# Patient Record
Sex: Female | Born: 1945 | Race: White | Hispanic: No | Smoking: Never smoker
Health system: Southern US, Community
[De-identification: ages and names within clinical notes are randomized; demographics above are authoritative.]

## PROBLEM LIST (undated history)

## (undated) DIAGNOSIS — J45909 Unspecified asthma, uncomplicated: Secondary | ICD-10-CM

## (undated) DIAGNOSIS — K5790 Diverticulosis of intestine, part unspecified, without perforation or abscess without bleeding: Secondary | ICD-10-CM

## (undated) DIAGNOSIS — M1990 Unspecified osteoarthritis, unspecified site: Secondary | ICD-10-CM

## (undated) DIAGNOSIS — Z91038 Other insect allergy status: Secondary | ICD-10-CM

## (undated) DIAGNOSIS — Z9289 Personal history of other medical treatment: Secondary | ICD-10-CM

## (undated) DIAGNOSIS — H269 Unspecified cataract: Secondary | ICD-10-CM

## (undated) DIAGNOSIS — E119 Type 2 diabetes mellitus without complications: Secondary | ICD-10-CM

## (undated) DIAGNOSIS — Z860100 Personal history of colon polyps, unspecified: Secondary | ICD-10-CM

## (undated) DIAGNOSIS — T8859XA Other complications of anesthesia, initial encounter: Secondary | ICD-10-CM

## (undated) DIAGNOSIS — Z91030 Bee allergy status: Secondary | ICD-10-CM

## (undated) DIAGNOSIS — K219 Gastro-esophageal reflux disease without esophagitis: Secondary | ICD-10-CM

## (undated) DIAGNOSIS — E785 Hyperlipidemia, unspecified: Secondary | ICD-10-CM

## (undated) DIAGNOSIS — E669 Obesity, unspecified: Secondary | ICD-10-CM

## (undated) DIAGNOSIS — I00 Rheumatic fever without heart involvement: Secondary | ICD-10-CM

## (undated) DIAGNOSIS — I447 Left bundle-branch block, unspecified: Secondary | ICD-10-CM

## (undated) DIAGNOSIS — T7840XA Allergy, unspecified, initial encounter: Secondary | ICD-10-CM

## (undated) DIAGNOSIS — Z87448 Personal history of other diseases of urinary system: Secondary | ICD-10-CM

## (undated) DIAGNOSIS — K5792 Diverticulitis of intestine, part unspecified, without perforation or abscess without bleeding: Secondary | ICD-10-CM

## (undated) DIAGNOSIS — I10 Essential (primary) hypertension: Secondary | ICD-10-CM

## (undated) DIAGNOSIS — T4145XA Adverse effect of unspecified anesthetic, initial encounter: Secondary | ICD-10-CM

## (undated) HISTORY — DX: Type 2 diabetes mellitus without complications: E11.9

## (undated) HISTORY — DX: Obesity, unspecified: E66.9

## (undated) HISTORY — DX: Personal history of other medical treatment: Z92.89

## (undated) HISTORY — DX: Allergy, unspecified, initial encounter: T78.40XA

## (undated) HISTORY — DX: Diverticulosis of intestine, part unspecified, without perforation or abscess without bleeding: K57.90

## (undated) HISTORY — DX: Unspecified cataract: H26.9

## (undated) HISTORY — DX: Rheumatic fever without heart involvement: I00

## (undated) HISTORY — DX: Bee allergy status: Z91.030

## (undated) HISTORY — DX: Personal history of colon polyps, unspecified: Z86.0100

## (undated) HISTORY — PX: SINOSCOPY: SHX187

## (undated) HISTORY — DX: Other insect allergy status: Z91.038

## (undated) HISTORY — PX: COLONOSCOPY: SHX174

## (undated) HISTORY — DX: Left bundle-branch block, unspecified: I44.7

## (undated) HISTORY — PX: TONSILLECTOMY: SUR1361

## (undated) HISTORY — DX: Hyperlipidemia, unspecified: E78.5

## (undated) HISTORY — DX: Personal history of other diseases of urinary system: Z87.448

## (undated) HISTORY — PX: JOINT REPLACEMENT: SHX530

## (undated) HISTORY — DX: Personal history of colonic polyps: Z86.010

---

## 1978-03-17 HISTORY — PX: ABDOMINAL HYSTERECTOMY: SHX81

## 1978-03-17 HISTORY — PX: APPENDECTOMY: SHX54

## 1987-03-18 HISTORY — PX: CHOLECYSTECTOMY: SHX55

## 2000-06-23 ENCOUNTER — Encounter: Payer: Self-pay | Admitting: Family Medicine

## 2000-06-23 ENCOUNTER — Encounter: Admission: RE | Admit: 2000-06-23 | Discharge: 2000-06-23 | Payer: Self-pay | Admitting: Family Medicine

## 2001-06-23 ENCOUNTER — Encounter: Admission: RE | Admit: 2001-06-23 | Discharge: 2001-06-23 | Payer: Self-pay | Admitting: Internal Medicine

## 2001-06-23 ENCOUNTER — Encounter: Payer: Self-pay | Admitting: Obstetrics and Gynecology

## 2002-03-17 DIAGNOSIS — Z87448 Personal history of other diseases of urinary system: Secondary | ICD-10-CM

## 2002-03-17 HISTORY — DX: Personal history of other diseases of urinary system: Z87.448

## 2002-06-28 ENCOUNTER — Encounter: Admission: RE | Admit: 2002-06-28 | Discharge: 2002-06-28 | Payer: Self-pay | Admitting: Family Medicine

## 2002-06-28 ENCOUNTER — Encounter: Payer: Self-pay | Admitting: Family Medicine

## 2003-07-03 ENCOUNTER — Ambulatory Visit (HOSPITAL_COMMUNITY): Admission: RE | Admit: 2003-07-03 | Discharge: 2003-07-03 | Payer: Self-pay

## 2004-10-10 ENCOUNTER — Ambulatory Visit (HOSPITAL_COMMUNITY): Admission: RE | Admit: 2004-10-10 | Discharge: 2004-10-10 | Payer: Self-pay | Admitting: Family Medicine

## 2005-10-27 ENCOUNTER — Ambulatory Visit (HOSPITAL_COMMUNITY): Admission: RE | Admit: 2005-10-27 | Discharge: 2005-10-27 | Payer: Self-pay | Admitting: Family Medicine

## 2006-11-02 ENCOUNTER — Encounter: Admission: RE | Admit: 2006-11-02 | Discharge: 2006-11-02 | Payer: Self-pay | Admitting: Family Medicine

## 2007-03-18 HISTORY — PX: KNEE SURGERY: SHX244

## 2007-11-30 ENCOUNTER — Ambulatory Visit (HOSPITAL_BASED_OUTPATIENT_CLINIC_OR_DEPARTMENT_OTHER): Admission: RE | Admit: 2007-11-30 | Discharge: 2007-11-30 | Payer: Self-pay | Admitting: Family Medicine

## 2007-12-07 ENCOUNTER — Encounter: Admission: RE | Admit: 2007-12-07 | Discharge: 2007-12-07 | Payer: Self-pay | Admitting: Family Medicine

## 2008-08-07 HISTORY — PX: COLONOSCOPY: SHX174

## 2009-03-17 HISTORY — PX: COLON SURGERY: SHX602

## 2009-11-07 ENCOUNTER — Inpatient Hospital Stay (HOSPITAL_COMMUNITY): Admission: RE | Admit: 2009-11-07 | Discharge: 2009-11-12 | Payer: Self-pay | Admitting: General Surgery

## 2009-11-07 ENCOUNTER — Encounter (INDEPENDENT_AMBULATORY_CARE_PROVIDER_SITE_OTHER): Payer: Self-pay | Admitting: General Surgery

## 2010-05-31 LAB — BASIC METABOLIC PANEL
BUN: 3 mg/dL — ABNORMAL LOW (ref 6–23)
BUN: 7 mg/dL (ref 6–23)
BUN: 8 mg/dL (ref 6–23)
CO2: 27 mEq/L (ref 19–32)
CO2: 27 mEq/L (ref 19–32)
CO2: 29 mEq/L (ref 19–32)
Calcium: 7.9 mg/dL — ABNORMAL LOW (ref 8.4–10.5)
Calcium: 7.9 mg/dL — ABNORMAL LOW (ref 8.4–10.5)
Calcium: 7.9 mg/dL — ABNORMAL LOW (ref 8.4–10.5)
Chloride: 102 mEq/L (ref 96–112)
Chloride: 104 mEq/L (ref 96–112)
Creatinine, Ser: 0.68 mg/dL (ref 0.4–1.2)
Creatinine, Ser: 0.7 mg/dL (ref 0.4–1.2)
Creatinine, Ser: 0.76 mg/dL (ref 0.4–1.2)
GFR calc Af Amer: >60 mL/min (ref >60)
GFR calc Af Amer: >60 mL/min (ref >60)
GFR calc Af Amer: >60 mL/min (ref >60)
GFR calc non Af Amer: >60 mL/min (ref >60)
GFR calc non Af Amer: >60 mL/min (ref >60)
GFR calc non Af Amer: >60 mL/min (ref >60)
Glucose, Bld: 116 mg/dL — ABNORMAL HIGH (ref 70–99)
Glucose, Bld: 152 mg/dL — ABNORMAL HIGH (ref 70–99)
Potassium: 3 mEq/L — ABNORMAL LOW (ref 3.5–5.1)
Potassium: 3.4 mEq/L — ABNORMAL LOW (ref 3.5–5.1)
Sodium: 138 mEq/L (ref 135–145)
Sodium: 139 mEq/L (ref 135–145)
Sodium: 140 mEq/L (ref 135–145)

## 2010-05-31 LAB — COMPREHENSIVE METABOLIC PANEL
ALT: 22 U/L (ref 0–35)
CO2: 26 mEq/L (ref 19–32)
Calcium: 9.2 mg/dL (ref 8.4–10.5)
Creatinine, Ser: 0.74 mg/dL (ref 0.4–1.2)
GFR calc non Af Amer: >60 mL/min (ref >60)
Glucose, Bld: 150 mg/dL — ABNORMAL HIGH (ref 70–99)
Sodium: 139 mEq/L (ref 135–145)

## 2010-05-31 LAB — CBC
HCT: 23.5 % — ABNORMAL LOW (ref 36.0–46.0)
HCT: 44.3 % (ref 36.0–46.0)
Hemoglobin: 15.4 g/dL — ABNORMAL HIGH (ref 12.0–15.0)
Hemoglobin: 8.8 g/dL — ABNORMAL LOW (ref 12.0–15.0)
MCH: 31 pg (ref 26.0–34.0)
MCH: 31 pg (ref 26.0–34.0)
MCH: 31.5 pg (ref 26.0–34.0)
MCH: 31.5 pg (ref 26.0–34.0)
MCHC: 34.7 g/dL (ref 30.0–36.0)
MCHC: 34.8 g/dL (ref 30.0–36.0)
MCHC: 35.3 g/dL (ref 30.0–36.0)
MCV: 89.2 fL (ref 78.0–100.0)
MCV: 89.2 fL (ref 78.0–100.0)
MCV: 89.6 fL (ref 78.0–100.0)
Platelets: 181 10*3/uL (ref 150–400)
Platelets: 183 10*3/uL (ref 150–400)
Platelets: 198 10*3/uL (ref 150–400)
Platelets: 244 10*3/uL (ref 150–400)
RBC: 2.57 MIL/uL — ABNORMAL LOW (ref 3.87–5.11)
RBC: 2.63 MIL/uL — ABNORMAL LOW (ref 3.87–5.11)
RBC: 3.41 MIL/uL — ABNORMAL LOW (ref 3.87–5.11)
RDW: 13 % (ref 11.5–15.5)
RDW: 13.1 % (ref 11.5–15.5)
RDW: 13.1 % (ref 11.5–15.5)
RDW: 13.2 % (ref 11.5–15.5)
WBC: 10.8 10*3/uL — ABNORMAL HIGH (ref 4.0–10.5)
WBC: 6 10*3/uL (ref 4.0–10.5)
WBC: 7.5 10*3/uL (ref 4.0–10.5)

## 2010-05-31 LAB — DIFFERENTIAL
Basophils Absolute: 0 10*3/uL (ref 0.0–0.1)
Basophils Relative: 0 % (ref 0–1)
Eosinophils Absolute: 0.1 10*3/uL (ref 0.0–0.7)
Eosinophils Relative: 3 % (ref 0–5)
Lymphocytes Relative: 24 % (ref 12–46)
Lymphs Abs: 1.5 10*3/uL (ref 0.7–4.0)
Lymphs Abs: 1.9 10*3/uL (ref 0.7–4.0)
Monocytes Absolute: 0.4 10*3/uL (ref 0.1–1.0)
Monocytes Relative: 7 % (ref 3–12)
Neutrophils Relative %: 68 % (ref 43–77)

## 2010-05-31 LAB — PROTIME-INR
INR: 0.96 (ref 0.00–1.49)
Prothrombin Time: 13 seconds (ref 11.6–15.2)

## 2010-05-31 LAB — ABO/RH: ABO/RH(D): B POS

## 2010-05-31 LAB — MRSA PCR SCREENING: MRSA by PCR: NEGATIVE

## 2010-05-31 LAB — URINALYSIS, ROUTINE W REFLEX MICROSCOPIC
Bilirubin Urine: NEGATIVE
Hgb urine dipstick: NEGATIVE
Ketones, ur: NEGATIVE mg/dL
Protein, ur: NEGATIVE mg/dL
Urobilinogen, UA: 0.2 mg/dL (ref 0.0–1.0)

## 2010-05-31 LAB — TYPE AND SCREEN

## 2010-05-31 LAB — CARDIAC PANEL(CRET KIN+CKTOT+MB+TROPI)
CK, MB: 2.6 ng/mL (ref 0.3–4.0)
Total CK: 100 U/L (ref 7–177)

## 2010-05-31 LAB — MAGNESIUM: Magnesium: 1.8 mg/dL (ref 1.5–2.5)

## 2013-07-20 ENCOUNTER — Emergency Department (HOSPITAL_COMMUNITY): Payer: Medicare Other

## 2013-07-20 ENCOUNTER — Encounter (HOSPITAL_COMMUNITY): Payer: Self-pay | Admitting: Emergency Medicine

## 2013-07-20 ENCOUNTER — Observation Stay (HOSPITAL_COMMUNITY)
Admission: EM | Admit: 2013-07-20 | Discharge: 2013-07-21 | Disposition: A | Discharge status: Home / Self Care | Payer: Medicare Other | Attending: Internal Medicine | Admitting: Internal Medicine

## 2013-07-20 DIAGNOSIS — K5732 Diverticulitis of large intestine without perforation or abscess without bleeding: Secondary | ICD-10-CM | POA: Insufficient documentation

## 2013-07-20 DIAGNOSIS — R42 Dizziness and giddiness: Principal | ICD-10-CM | POA: Insufficient documentation

## 2013-07-20 DIAGNOSIS — Z791 Long term (current) use of non-steroidal anti-inflammatories (NSAID): Secondary | ICD-10-CM | POA: Insufficient documentation

## 2013-07-20 DIAGNOSIS — E669 Obesity, unspecified: Secondary | ICD-10-CM

## 2013-07-20 DIAGNOSIS — I10 Essential (primary) hypertension: Secondary | ICD-10-CM | POA: Insufficient documentation

## 2013-07-20 DIAGNOSIS — R079 Chest pain, unspecified: Secondary | ICD-10-CM

## 2013-07-20 DIAGNOSIS — E785 Hyperlipidemia, unspecified: Secondary | ICD-10-CM

## 2013-07-20 DIAGNOSIS — N179 Acute kidney failure, unspecified: Secondary | ICD-10-CM | POA: Insufficient documentation

## 2013-07-20 DIAGNOSIS — E782 Mixed hyperlipidemia: Secondary | ICD-10-CM

## 2013-07-20 DIAGNOSIS — J45909 Unspecified asthma, uncomplicated: Secondary | ICD-10-CM | POA: Insufficient documentation

## 2013-07-20 DIAGNOSIS — R0789 Other chest pain: Secondary | ICD-10-CM | POA: Insufficient documentation

## 2013-07-20 DIAGNOSIS — M129 Arthropathy, unspecified: Secondary | ICD-10-CM | POA: Insufficient documentation

## 2013-07-20 DIAGNOSIS — Z79899 Other long term (current) drug therapy: Secondary | ICD-10-CM | POA: Insufficient documentation

## 2013-07-20 HISTORY — DX: Other complications of anesthesia, initial encounter: T88.59XA

## 2013-07-20 HISTORY — DX: Unspecified asthma, uncomplicated: J45.909

## 2013-07-20 HISTORY — DX: Essential (primary) hypertension: I10

## 2013-07-20 HISTORY — DX: Adverse effect of unspecified anesthetic, initial encounter: T41.45XA

## 2013-07-20 HISTORY — DX: Gastro-esophageal reflux disease without esophagitis: K21.9

## 2013-07-20 HISTORY — DX: Unspecified osteoarthritis, unspecified site: M19.90

## 2013-07-20 HISTORY — DX: Diverticulitis of intestine, part unspecified, without perforation or abscess without bleeding: K57.92

## 2013-07-20 LAB — I-STAT TROPONIN, ED: TROPONIN I, POC: 0 ng/mL (ref 0.00–0.08)

## 2013-07-20 LAB — CBC
HCT: 44.1 % (ref 36.0–46.0)
Hemoglobin: 15.5 g/dL — ABNORMAL HIGH (ref 12.0–15.0)
MCH: 31.7 pg (ref 26.0–34.0)
MCHC: 35.1 g/dL (ref 30.0–36.0)
MCV: 90.2 fL (ref 78.0–100.0)
PLATELETS: 234 10*3/uL (ref 150–400)
RBC: 4.89 MIL/uL (ref 3.87–5.11)
RDW: 12.7 % (ref 11.5–15.5)
WBC: 7.9 10*3/uL (ref 4.0–10.5)

## 2013-07-20 LAB — BASIC METABOLIC PANEL WITH GFR
BUN: 22 mg/dL (ref 6–23)
CO2: 24 meq/L (ref 19–32)
Calcium: 9.2 mg/dL (ref 8.4–10.5)
Chloride: 99 meq/L (ref 96–112)
Creatinine, Ser: 0.67 mg/dL (ref 0.50–1.10)
GFR calc Af Amer: >90 mL/min
GFR calc non Af Amer: >90 mL/min
Glucose, Bld: 146 mg/dL — ABNORMAL HIGH (ref 70–99)
Potassium: 4.4 meq/L (ref 3.7–5.3)
Sodium: 139 meq/L (ref 137–147)

## 2013-07-20 MED ORDER — ASPIRIN 325 MG PO TABS
325.0000 mg | ORAL_TABLET | Freq: Once | ORAL | Status: AC
  Administered 2013-07-20: 325 mg via ORAL
  Filled 2013-07-20: qty 1

## 2013-07-20 MED ORDER — LISINOPRIL 10 MG PO TABS
10.0000 mg | ORAL_TABLET | Freq: Every day | ORAL | Status: DC
  Administered 2013-07-21: 10 mg via ORAL
  Filled 2013-07-20: qty 1

## 2013-07-20 MED ORDER — NITROGLYCERIN 0.4 MG SL SUBL: 0.4000 mg | SUBLINGUAL_TABLET | SUBLINGUAL | Status: DC | PRN

## 2013-07-20 MED ORDER — NITROGLYCERIN 2 % TD OINT
1.0000 [in_us] | TOPICAL_OINTMENT | Freq: Once | TRANSDERMAL | Status: AC
  Administered 2013-07-20: 1 [in_us] via TOPICAL
  Filled 2013-07-20: qty 1

## 2013-07-20 MED ORDER — MOMETASONE FURO-FORMOTEROL FUM 100-5 MCG/ACT IN AERO
2.0000 | INHALATION_SPRAY | Freq: Two times a day (BID) | RESPIRATORY_TRACT | Status: DC
Start: 2013-07-20 — End: 2013-07-21
  Administered 2013-07-20: 2 via RESPIRATORY_TRACT
  Filled 2013-07-20: qty 8.8

## 2013-07-20 MED ORDER — HEPARIN SODIUM (PORCINE) 5000 UNIT/ML IJ SOLN
5000.0000 [IU] | Freq: Three times a day (TID) | INTRAMUSCULAR | Status: DC
  Administered 2013-07-20 – 2013-07-21 (×2): 5000 [IU] via SUBCUTANEOUS
  Filled 2013-07-20 (×5): qty 1

## 2013-07-20 MED ORDER — MONTELUKAST SODIUM 10 MG PO TABS
10.0000 mg | ORAL_TABLET | Freq: Every day | ORAL | Status: DC
  Administered 2013-07-21: 10 mg via ORAL
  Filled 2013-07-20: qty 1

## 2013-07-20 MED ORDER — FUROSEMIDE 40 MG PO TABS
40.0000 mg | ORAL_TABLET | Freq: Every day | ORAL | Status: DC
  Administered 2013-07-21: 40 mg via ORAL
  Filled 2013-07-20: qty 1

## 2013-07-20 MED ORDER — ASPIRIN EC 81 MG PO TBEC
81.0000 mg | DELAYED_RELEASE_TABLET | Freq: Every day | ORAL | Status: DC
  Administered 2013-07-21: 81 mg via ORAL
  Filled 2013-07-20: qty 1

## 2013-07-20 MED ORDER — ACETAMINOPHEN 500 MG PO TABS: 500.0000 mg | ORAL_TABLET | Freq: Three times a day (TID) | ORAL | Status: DC | PRN

## 2013-07-20 MED ORDER — ALBUTEROL SULFATE HFA 108 (90 BASE) MCG/ACT IN AERS: 1.0000 | INHALATION_SPRAY | RESPIRATORY_TRACT | Status: DC | PRN

## 2013-07-20 MED ORDER — OMEGA-3-ACID ETHYL ESTERS 1 G PO CAPS
4000.0000 mg | ORAL_CAPSULE | Freq: Every day | ORAL | Status: DC
  Administered 2013-07-21: 2000 mg via ORAL
  Filled 2013-07-20: qty 4

## 2013-07-20 MED ORDER — ALBUTEROL SULFATE (2.5 MG/3ML) 0.083% IN NEBU: 2.5000 mg | INHALATION_SOLUTION | RESPIRATORY_TRACT | Status: DC | PRN

## 2013-07-20 MED ORDER — REGADENOSON 0.4 MG/5ML IV SOLN
0.4000 mg | Freq: Once | INTRAVENOUS | Status: AC
Start: 2013-07-21 — End: 2013-07-21
  Administered 2013-07-21: 0.4 mg via INTRAVENOUS
  Filled 2013-07-20: qty 5

## 2013-07-20 MED ORDER — PANTOPRAZOLE SODIUM 40 MG PO TBEC
40.0000 mg | DELAYED_RELEASE_TABLET | Freq: Every day | ORAL | Status: DC
  Administered 2013-07-21: 40 mg via ORAL
  Filled 2013-07-20: qty 1

## 2013-07-20 NOTE — ED Notes (Signed)
Per EMS- pt was driving when she had a sudden onset of dizziness, stopped at the fire department. For the past 2 days she has had heavy CP that radiates into neck. Pt denies having that today. C/o dizziness upon standing, denies while sitting. Pt showing LBBB on ekg. HR 60-70 BP 140 palpated. No cardiac hx. CBG 101. 18G in left AC. 99% on 2 liters.

## 2013-07-20 NOTE — ED Provider Notes (Signed)
CSN: [id number]     Arrival date & time 07/20/13  1351 History   First MD Initiated Contact with Patient 07/20/13 1406     Chief Complaint  Patient presents with  . Dizziness     (Consider location/radiation/quality/duration/timing/severity/associated sxs/prior Treatment) Patient is a 68 y.o. female presenting with dizziness and chest pain. The history is provided by the patient.  Dizziness Quality:  Lightheadedness Severity:  Moderate Onset quality:  Sudden Timing:  Intermittent Progression:  Worsening Chronicity:  New Relieved by:  Nothing Worsened by:  Nothing tried Associated symptoms: chest pain   Associated symptoms: no syncope and no vomiting   Chest Pain Pain location:  Substernal area Pain quality: pressure   Pain radiates to:  Neck and mid back Pain radiates to the back: yes   Pain severity:  Moderate Onset quality:  Sudden Timing:  Intermittent Progression:  Worsening Context: not raising an arm, no stress and no trauma   Relieved by:  Leaning forward Worsened by:  Certain positions (lying down) Associated symptoms: dizziness   Associated symptoms: no fever, no syncope and not vomiting     Past Medical History  Diagnosis Date  . Acute renal failure 2004    accidently took too much aleve  . Diverticulitis   . Asthma   . Arthritis   . Hypertension    Past Surgical History  Procedure Laterality Date  . Abdominal hysterectomy  1980    complete  . Appendectomy  1980  . Cholecystectomy  1989  . Knee surgery Bilateral 2009  . Colon surgery  2011    removed 11 inches of colon   No family history on file. History  Substance Use Topics  . Smoking status: Never Smoker   . Smokeless tobacco: Not on file  . Alcohol Use: No   OB History   Grav Para Term Preterm Abortions TAB SAB Ect Mult Living                 Review of Systems  Constitutional: Negative for fever.  Cardiovascular: Positive for chest pain. Negative for syncope.  Gastrointestinal:  Negative for vomiting.  Neurological: Positive for dizziness.  All other systems reviewed and are negative.     Allergies  Ciprofloxacin; Codeine; Diflucan; Niaspan; and Statins  Home Medications   Prior to Admission medications   Medication Sig Start Date End Date Taking? Authorizing Provider  acetaminophen (TYLENOL) 500 MG tablet Take 500 mg by mouth every 8 (eight) hours as needed for moderate pain.   Yes Historical Provider, MD  albuterol (PROVENTIL HFA;VENTOLIN HFA) 108 (90 BASE) MCG/ACT inhaler Inhale 1-2 puffs into the lungs every 4 (four) hours as needed for wheezing or shortness of breath.   Yes Historical Provider, MD  clotrimazole-betamethasone (LOTRISONE) cream Apply 1 application topically 2 (two) times daily as needed.    Yes Historical Provider, MD  EPINEPHrine (EPIPEN) 0.3 mg/0.3 mL IJ SOAJ injection Inject 0.3 mg into the muscle once.   Yes Historical Provider, MD  fexofenadine (ALLEGRA) 180 MG tablet Take 180 mg by mouth daily.   Yes Historical Provider, MD  Fluticasone-Salmeterol (ADVAIR) 250-50 MCG/DOSE AEPB Inhale 1 puff into the lungs 2 (two) times daily.   Yes Historical Provider, MD  furosemide (LASIX) 40 MG tablet Take 40 mg by mouth daily.   Yes Historical Provider, MD  lisinopril (PRINIVIL,ZESTRIL) 10 MG tablet Take 10 mg by mouth daily.   Yes Historical Provider, MD  LORazepam (ATIVAN) 1 MG tablet Take 1 mg by mouth 2 (  two) times daily as needed for anxiety.   Yes Historical Provider, MD  montelukast (SINGULAIR) 10 MG tablet Take 10 mg by mouth daily.   Yes Historical Provider, MD  naproxen (NAPROSYN) 250 MG tablet Take 250 mg by mouth 2 (two) times daily with a meal.   Yes Historical Provider, MD  omega-3 acid ethyl esters (LOVAZA) 1 G capsule Take 4,000 mg by mouth daily.   Yes Historical Provider, MD  pantoprazole (PROTONIX) 40 MG tablet Take 40 mg by mouth daily.   Yes Historical Provider, MD   BP 146/73  Pulse 73  Temp(Src) 98.3 F (36.8 C) (Oral)   Resp 17  Ht 5\' 4"  (1.626 m)  Wt 225 lb (102.059 kg)  BMI 38.60 kg/m2  SpO2 96% Physical Exam  Nursing note and vitals reviewed. Constitutional: She is oriented to person, place, and time. She appears well-developed and well-nourished. No distress.  HENT:  Head: Normocephalic and atraumatic.  Eyes: EOM are normal. Pupils are equal, round, and reactive to light.  Neck: Normal range of motion. Neck supple.  Cardiovascular: Normal rate and regular rhythm.  Exam reveals no friction rub.   No murmur heard. Pulmonary/Chest: Effort normal and breath sounds normal. No respiratory distress. She has no wheezes. She has no rales.  Abdominal: Soft. She exhibits no distension. There is no tenderness. There is no rebound.  Musculoskeletal: Normal range of motion. She exhibits no edema.  Neurological: She is alert and oriented to person, place, and time.  Skin: She is not diaphoretic.    ED Course  Procedures (including critical care time) Labs Review Labs Reviewed  CBC - Abnormal; Notable for the following:    Hemoglobin 15.5 (*)    All other components within normal limits  BASIC METABOLIC PANEL - Abnormal; Notable for the following:    Glucose, Bld 146 (*)    All other components within normal limits  I-STAT TROPOININ, ED    Imaging Review Dg Chest 2 View  07/20/2013   CLINICAL DATA:  Dizziness and chest pressure  EXAM: CHEST  2 VIEW  COMPARISON:  06/21/2013  FINDINGS: Cardiac shadow is stable. The lungs are well aerated bilaterally. Minimal left basilar atelectasis is seen. No focal confluent infiltrate is noted. No acute bony abnormality is noted.  IMPRESSION: Minimal left basilar atelectasis.   Electronically Signed   By: Inez Catalina M.D.   On: 07/20/2013 15:14   Ct Head Wo Contrast  07/20/2013   CLINICAL DATA:  Dizziness  EXAM: CT HEAD WITHOUT CONTRAST  TECHNIQUE: Contiguous axial images were obtained from the base of the skull through the vertex without intravenous contrast.   COMPARISON:  CT 12/07/2012  FINDINGS: Ventricle size is normal. Negative for acute or chronic infarction. Negative for hemorrhage or fluid collection. Negative for mass or edema. No shift of the midline structures.  Calvarium is intact.  IMPRESSION: Normal   Electronically Signed   By: Franchot Gallo M.D.   On: 07/20/2013 15:34     EKG Interpretation   Date/Time:  Wednesday Jul 20 2013 14:07:04 EDT Ventricular Rate:  70 PR Interval:  206 QRS Duration: 151 QT Interval:  443 QTC Calculation: 478 R Axis:   13 Text Interpretation:  Sinus rhythm Left bundle branch block LBBB  morphology seen on previous in Lead I, but not anteriorly Confirmed by  Mingo Amber  MD, Verdell Kincannon ([id number]) on 07/20/2013 2:13:20 PM      MDM   Final diagnoses:  Chest pain  Dizziness    10F  with hx of HTN, asthma presents with multiple complaints. Patient had intermittent chest pain for the past 2 days. Worse with lying down, better with sitting up. Described as pressure radiating to neck, back, arm. He's also having new headaches with associated dizziness Exam benign here, normal cranial nerves, lungs clear, no murmurs, regular rhythm on the monitor.  Cardiology consulted to see the patient.    Osvaldo Shipper, MD 07/20/13 1600

## 2013-07-20 NOTE — ED Notes (Signed)
Phlebotomy at bedside.

## 2013-07-20 NOTE — ED Notes (Signed)
Pharmacy tech at bedside 

## 2013-07-20 NOTE — H&P (Signed)
PCP: [facility] Primary Cardiologist: (New)   Chief Complaint: Chest Pain and Dizziness  HPI: The patient is a 68 y/o, moderately obese WF, with a history of HTN, HLD, borderline DM and asthma who presents to the [facility] ER with a complaint of substernal chest pain and dizziness. She denies any past cardiac issues, but has a family h/o of CAD, with her father suffering an MI with subsequent CABG at age 42. Her mother also had CHF. 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 She denies any history of tobacco use, past or present.   She states that she was in her usual state of health until yesterday. She noticed intermittent episodes of mild substernal chest pressure.  Rated 3/10. Non radiating. No exacerbating factors. Occurred off and on throughout the day. Episodes lasted ~ 1-2 minutes each. Episodes occurred as rest with associated lightheadedness and mild SOB. No diaphoresis, n/v. She did not take any medicines for her discomfort.   Today, while driving on the highway, she developed sudden onset of dizziness. She denies syncope/ near syncope. She had recurrent mild chest discomfort and slight SOB, but denies any other symptoms. This particular episode lasted for ~30 minutes before spontaneously resolving. Her symptoms concerned her, prompting her to come to the ER. She received ASA and a nitro patch. Her symptoms have resolved, but she now has a headache.   W/u has revealed LBBB on EKG (no old EKG to compare to). Negative troponin x 1. CT of the head was unremarkable. CXR c/w minimal basilar atelectasis.   Past Medical History  Diagnosis Date  . Acute renal failure 2004    accidently took too much aleve  . Diverticulitis   . Asthma   . Arthritis   . Hypertension     Past Surgical History  Procedure Laterality Date  . Abdominal hysterectomy  1980    complete  . Appendectomy  1980  . Cholecystectomy  1989  . Knee surgery  Bilateral 2009  . Colon surgery  2011    removed 11 inches of colon    Family History  Problem Relation Age of Onset  . Coronary artery disease Father 63    CABG   . Heart failure Mother    Social History:  reports that she has never smoked. She does not have any smokeless tobacco history on file. She reports that she does not drink alcohol or use illicit drugs.  Allergies:  Allergies  Allergen Reactions  . Ciprofloxacin Hives  . Codeine Other (See Comments)    "knocks me out"   . Diflucan [Fluconazole] Hives  . Niaspan [Niacin Er] Other (See Comments)  . Statins Other (See Comments)    Joint pain    Prior to Admission medications   Medication Sig Start Date End Date Taking? Authorizing Provider  acetaminophen (TYLENOL) 500 MG tablet Take 500 mg by mouth every 8 (eight) hours as needed for moderate pain.   Yes Historical Provider, MD  albuterol (PROVENTIL HFA;VENTOLIN HFA) 108 (90 BASE) MCG/ACT inhaler Inhale 1-2 puffs into the lungs every 4 (four) hours as needed for wheezing or shortness of breath.   Yes Historical Provider, MD  clotrimazole-betamethasone (LOTRISONE) cream Apply 1 application topically 2 (two) times daily as needed.    Yes Historical Provider, MD  EPINEPHrine (EPIPEN) 0.3 mg/0.3 mL IJ SOAJ injection Inject 0.3 mg into the muscle once.   Yes Historical Provider, MD  fexofenadine (ALLEGRA) 180  MG tablet Take 180 mg by mouth daily.   Yes Historical Provider, MD  Fluticasone-Salmeterol (ADVAIR) 250-50 MCG/DOSE AEPB Inhale 1 puff into the lungs 2 (two) times daily.   Yes Historical Provider, MD  furosemide (LASIX) 40 MG tablet Take 40 mg by mouth daily.   Yes Historical Provider, MD  lisinopril (PRINIVIL,ZESTRIL) 10 MG tablet Take 10 mg by mouth daily.   Yes Historical Provider, MD  LORazepam (ATIVAN) 1 MG tablet Take 1 mg by mouth 2 (two) times daily as needed for anxiety.   Yes Historical Provider, MD  montelukast (SINGULAIR) 10 MG tablet Take 10 mg by mouth  daily.   Yes Historical Provider, MD  naproxen (NAPROSYN) 250 MG tablet Take 250 mg by mouth 2 (two) times daily with a meal.   Yes Historical Provider, MD  omega-3 acid ethyl esters (LOVAZA) 1 G capsule Take 4,000 mg by mouth daily.   Yes Historical Provider, MD  pantoprazole (PROTONIX) 40 MG tablet Take 40 mg by mouth daily.   Yes Historical Provider, MD    Results for orders placed during the hospital encounter of 07/20/13 (from the past 48 hour(s))  CBC     Status: Abnormal   Collection Time    07/20/13  2:41 PM      Result Value Ref Range   WBC 7.9  4.0 - 10.5 K/uL   RBC 4.89  3.87 - 5.11 MIL/uL   Hemoglobin 15.5 (*) 12.0 - 15.0 g/dL   HCT 44.1  36.0 - 46.0 %   MCV 90.2  78.0 - 100.0 fL   MCH 31.7  26.0 - 34.0 pg   MCHC 35.1  30.0 - 36.0 g/dL   RDW 12.7  11.5 - 15.5 %   Platelets 234  150 - 400 K/uL  BASIC METABOLIC PANEL     Status: Abnormal   Collection Time    07/20/13  2:41 PM      Result Value Ref Range   Sodium 139  137 - 147 mEq/L   Potassium 4.4  3.7 - 5.3 mEq/L   Comment: HEMOLYSIS AT THIS LEVEL MAY AFFECT RESULT   Chloride 99  96 - 112 mEq/L   CO2 24  19 - 32 mEq/L   Glucose, Bld 146 (*) 70 - 99 mg/dL   BUN 22  6 - 23 mg/dL   Creatinine, Ser 0.67  0.50 - 1.10 mg/dL   Calcium 9.2  8.4 - 10.5 mg/dL   GFR calc non Af Amer >90  >90 mL/min   GFR calc Af Amer >90  >90 mL/min   Comment: (NOTE)     The eGFR has been calculated using the CKD EPI equation.     This calculation has not been validated in all clinical situations.     eGFR's persistently <90 mL/min signify possible Chronic Kidney     Disease.  Randolm Idol, ED     Status: None   Collection Time    07/20/13  2:47 PM      Result Value Ref Range   Troponin i, poc 0.00  0.00 - 0.08 ng/mL   Comment 3            Comment: Due to the release kinetics of cTnI,     a negative result within the first hours     of the onset of symptoms does not rule out     myocardial infarction with certainty.     If  myocardial infarction is still suspected,  repeat the test at appropriate intervals.   Dg Chest 2 View  07/20/2013   CLINICAL DATA:  Dizziness and chest pressure  EXAM: CHEST  2 VIEW  COMPARISON:  06/21/2013  FINDINGS: Cardiac shadow is stable. The lungs are well aerated bilaterally. Minimal left basilar atelectasis is seen. No focal confluent infiltrate is noted. No acute bony abnormality is noted.  IMPRESSION: Minimal left basilar atelectasis.   Electronically Signed   By: Inez Catalina M.D.   On: 07/20/2013 15:14   Ct Head Wo Contrast  07/20/2013   CLINICAL DATA:  Dizziness  EXAM: CT HEAD WITHOUT CONTRAST  TECHNIQUE: Contiguous axial images were obtained from the base of the skull through the vertex without intravenous contrast.  COMPARISON:  CT 12/07/2012  FINDINGS: Ventricle size is normal. Negative for acute or chronic infarction. Negative for hemorrhage or fluid collection. Negative for mass or edema. No shift of the midline structures.  Calvarium is intact.  IMPRESSION: Normal   Electronically Signed   By: Franchot Gallo M.D.   On: 07/20/2013 15:34    Review of Systems  Respiratory: Positive for shortness of breath.   Cardiovascular: Positive for chest pain.  Neurological: Positive for dizziness. Negative for loss of consciousness.  All other systems reviewed and are negative.   Blood pressure 115/74, pulse 59, temperature 98.3 F (36.8 C), temperature source Oral, resp. rate 20, height '5\' 4"'  (1.626 m), weight 225 lb (102.059 kg), SpO2 93.00%. Physical Exam  Constitutional: She is oriented to person, place, and time. She appears well-developed and well-nourished. No distress.  Neck: No JVD present. Carotid bruit is not present.  Cardiovascular: Normal rate and regular rhythm.  Exam reveals no gallop and no friction rub.   No murmur heard. Pulses:      Radial pulses are 2+ on the right side, and 2+ on the left side.       Dorsalis pedis pulses are 2+ on the right side, and 2+ on the  left side.  Respiratory: Effort normal. No respiratory distress. She has no wheezes. She has rales (LLL that clears w/ cough, c/w atelectasis).  Musculoskeletal: She exhibits no edema.  Neurological: She is alert and oriented to person, place, and time.  Skin: Skin is warm and dry. She is not diaphoretic.  Psychiatric: She has a normal mood and affect. Her behavior is normal.     Assessment/Plan Active Problems:   Chest pain   Dizziness   HTN (hypertension)   HLD (hyperlipidemia)   Obesity  Plan:  1. Chest pain - with associated dizziness. EKG shows LBBB(? New vs old, no prior EKG). Troponin negative x 1. She is currently symptom free with intro patch. She has multiple cardiac risk factors, including obesity, HTN, and HLD. She has never had a cardiac eval and never had a stress test. Suggest admitting for observation. Cycle troponins x 3. NPO at midnight. If troponins negative, then will consider NST in the am.   2. HTN: Currently well controlled at 115/74. Continue home dose of lisinopril.  3. HLD: pt reports history of high triglycerides. Will check lipid panel in the am. Statins are listed as an intolerance. Will continue Lovaza for now.   4. Borderline DM: check Hgb A1c   Brooke Castillo 07/20/2013, 6:13 PM

## 2013-07-20 NOTE — ED Notes (Signed)
Pt sts right now she just started feeling some left upper chest pressure. VSS.

## 2013-07-20 NOTE — H&P (Signed)
Pt. Seen and examined. Agree with the NP/PA-C note as written.  Pleasant 68 yo female with severe hypertriglyceridemia (1500's), HTN, borderline DM who presents with chest pressure (like an elephant on her chest) which is substernal and intermittent. EKG shows new LBBB (or presumed new).  Troponin initially is negative. She has never had cardiac evaluation. Agree with admission, nitrates for chest pain. NPO p MN for probable stress test tomorrow if she rules-out for ACS.  She wishes to see Dr. Haroldine Laws after discharge (he took care of her mother).  Pixie Casino, MD, [facility] Attending Cardiologist [place]

## 2013-07-20 NOTE — ED Notes (Signed)
Cardiology consult at bedside.

## 2013-07-20 NOTE — ED Notes (Addendum)
Pt c/o lightheadedness when driving then while standing up. sts she went to the fire department and they checked her vital signs then had her stand up, reports she felt lightheaded when standing. Also c/o HA, reports it started yesterday while she was at work. Denies weakness to extremities/numbnesness/tingling. Nad, skin warm and dry, resp e/u.

## 2013-07-21 ENCOUNTER — Encounter (HOSPITAL_COMMUNITY): Payer: Self-pay | Admitting: General Practice

## 2013-07-21 ENCOUNTER — Observation Stay (HOSPITAL_COMMUNITY): Payer: Medicare Other

## 2013-07-21 DIAGNOSIS — R079 Chest pain, unspecified: Secondary | ICD-10-CM

## 2013-07-21 LAB — BASIC METABOLIC PANEL
BUN: 20 mg/dL (ref 6–23)
CHLORIDE: 100 meq/L (ref 96–112)
CO2: 23 meq/L (ref 19–32)
Calcium: 9 mg/dL (ref 8.4–10.5)
Creatinine, Ser: 0.59 mg/dL (ref 0.50–1.10)
GFR calc non Af Amer: >90 mL/min (ref >90)
Glucose, Bld: 100 mg/dL — ABNORMAL HIGH (ref 70–99)
Potassium: 3.9 mEq/L (ref 3.7–5.3)
SODIUM: 139 meq/L (ref 137–147)

## 2013-07-21 LAB — TROPONIN I
Troponin I: <0.3 ng/mL (ref <0.30)
Troponin I: <0.3 ng/mL (ref <0.30)
Troponin I: <0.3 ng/mL (ref <0.30)

## 2013-07-21 LAB — LIPID PANEL
CHOL/HDL RATIO: 5.4 ratio
CHOLESTEROL: 234 mg/dL — AB (ref 0–200)
HDL: 43 mg/dL (ref >39)
LDL Cholesterol: UNDETERMINED mg/dL (ref 0–99)
TRIGLYCERIDES: 557 mg/dL — AB (ref <150)
VLDL: UNDETERMINED mg/dL (ref 0–40)

## 2013-07-21 LAB — CBC
HCT: 42 % (ref 36.0–46.0)
Hemoglobin: 14.2 g/dL (ref 12.0–15.0)
MCH: 30.7 pg (ref 26.0–34.0)
MCHC: 33.8 g/dL (ref 30.0–36.0)
MCV: 90.7 fL (ref 78.0–100.0)
PLATELETS: 224 10*3/uL (ref 150–400)
RBC: 4.63 MIL/uL (ref 3.87–5.11)
RDW: 12.9 % (ref 11.5–15.5)
WBC: 5.8 10*3/uL (ref 4.0–10.5)

## 2013-07-21 LAB — HEMOGLOBIN A1C
HEMOGLOBIN A1C: 6.2 % — AB (ref <5.7)
MEAN PLASMA GLUCOSE: 131 mg/dL — AB (ref <117)

## 2013-07-21 MED ORDER — ACETAMINOPHEN 325 MG PO TABS
ORAL_TABLET | ORAL | Status: AC
  Filled 2013-07-21: qty 2

## 2013-07-21 MED ORDER — TECHNETIUM TC 99M SESTAMIBI GENERIC - CARDIOLITE
10.0000 | Freq: Once | INTRAVENOUS | Status: AC | PRN
  Administered 2013-07-21: 10 via INTRAVENOUS

## 2013-07-21 MED ORDER — TECHNETIUM TC 99M SESTAMIBI GENERIC - CARDIOLITE
30.0000 | Freq: Once | INTRAVENOUS | Status: AC | PRN
  Administered 2013-07-21: 30 via INTRAVENOUS

## 2013-07-21 MED ORDER — ACETAMINOPHEN 325 MG PO TABS
650.0000 mg | ORAL_TABLET | Freq: Three times a day (TID) | ORAL | Status: DC | PRN
  Administered 2013-07-21: 650 mg via ORAL
  Filled 2013-07-21: qty 2

## 2013-07-21 MED ORDER — REGADENOSON 0.4 MG/5ML IV SOLN
INTRAVENOUS | Status: AC
  Administered 2013-07-21: 0.4 mg via INTRAVENOUS
  Filled 2013-07-21: qty 5

## 2013-07-21 NOTE — Progress Notes (Signed)
See DC note   Ramond Dial., MD, [facility] 07/21/2013, 7:06 PM Office - [phone] Pager [phone]

## 2013-07-21 NOTE — Progress Notes (Signed)
Lexiscan CL performed 

## 2013-07-21 NOTE — Progress Notes (Signed)
UR completed 

## 2013-07-21 NOTE — Progress Notes (Signed)
PROGRESS NOTE  Subjective:   The patient is a 68 y/o, moderately obese WF, with a history of HTN, HLD, borderline DM and asthma who presents to the [facility] ER with a complaint of substernal chest pain and dizziness. She denies any past cardiac issues, but has a family h/o of CAD, with her father suffering an MI with subsequent CABG at age 24. Her mother also had CHF. 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 She denies any history of tobacco use, past or present.   troponins are normal,  For myoview today.  Results are pending.   Trigs are markedly elevated.  But down from last week.    Objective:    Vital Signs:   Temp:  [97.4 F (36.3 C)-98.3 F (36.8 C)] 97.4 F (36.3 C) (05/07 0546) Pulse Rate:  [59-73] 63 (05/07 0546) Resp:  [14-21] 18 (05/07 0546) BP: (105-152)/(49-82) 147/54 mmHg (05/07 0932) SpO2:  [92 %-97 %] 96 % (05/07 0546) Weight:  [223 lb (101.152 kg)-226 lb 9.6 oz (102.785 kg)] 223 lb (101.152 kg) (05/07 0546)  Last BM Date: 07/21/13   24-hour weight change: Weight change:   Weight trends: Filed Weights   07/20/13 1353 07/20/13 2030 07/21/13 0546  Weight: 225 lb (102.059 kg) 226 lb 9.6 oz (102.785 kg) 223 lb (101.152 kg)    Intake/Output:  05/06 0701 - 05/07 0700 In: -  Out: 600 [Urine:600]     Physical Exam: BP 147/54  Pulse 63  Temp(Src) 97.4 F (36.3 C) (Oral)  Resp 18  Ht 5\' 4"  (1.626 m)  Wt 223 lb (101.152 kg)  BMI 38.26 kg/m2  SpO2 96%  Wt Readings from Last 3 Encounters:  07/21/13 223 lb (101.152 kg)    General: Vital signs reviewed and noted. Moderately obese  Head: Normocephalic, atraumatic.  Eyes: conjunctivae/corneas clear.  EOM's intact.   Throat: normal  Neck:  normal   Lungs:    clear   Heart:  RR, normal s12s2  Abdomen:  Soft, non-tender, non-distended    Extremities:  no edema  Neurologic: A&O X3, CN II - XII are grossly intact.   Psych: Normal      Labs: BMET:  Recent Labs  07/20/13 1441 07/21/13 0554  NA 139 139  K 4.4 3.9  CL 99 100  CO2 24 23  GLUCOSE 146* 100*  BUN 22 20  CREATININE 0.67 0.59  CALCIUM 9.2 9.0    Liver function tests: No results found for this basename: AST, ALT, ALKPHOS, BILITOT, PROT, ALBUMIN,  in the last 72 hours No results found for this basename: LIPASE, AMYLASE,  in the last 72 hours  CBC:  Recent Labs  07/20/13 1441 07/21/13 0554  WBC 7.9 5.8  HGB 15.5* 14.2  HCT 44.1 42.0  MCV 90.2 90.7  PLT 234 224    Cardiac Enzymes:  Recent Labs  07/21/13 0522  TROPONINI <0.30    Coagulation Studies: No results found for this basename: LABPROT, INR,  in the last 72 hours  Other: No components found with this basename: POCBNP,  No results found for this basename: DDIMER,  in the last 72 hours  Recent Labs  07/20/13 1441  HGBA1C 6.2*    Recent Labs  07/21/13 0554  CHOL 234*  HDL 43  LDLCALC UNABLE TO CALCULATE IF TRIGLYCERIDE OVER 400 mg/dL  TRIG 557*  CHOLHDL 5.4   No results found for this basename: TSH, T4TOTAL,  FREET3, T3FREE, THYROIDAB,  in the last 72 hours No results found for this basename: VITAMINB12, FOLATE, FERRITIN, TIBC, IRON, RETICCTPCT,  in the last 72 hours   Other results: EKG :  Normal.  Medications:    Infusions:    Scheduled Medications: . aspirin EC  81 mg Oral Daily  . furosemide  40 mg Oral Daily  . heparin  5,000 Units Subcutaneous 3 times per day  . lisinopril  10 mg Oral Daily  . mometasone-formoterol  2 puff Inhalation BID  . montelukast  10 mg Oral Daily  . omega-3 acid ethyl esters  4,000 mg Oral Daily  . pantoprazole  40 mg Oral Daily  . regadenoson      . regadenoson  0.4 mg Intravenous Once    Assessment/ Plan:     1. Chest pain:  troponins are normal.   If the myoview is normal she will be able to go home.  I do not think she needs follow up if the myoview is normal  2. Hypertriglyceridemia.  Managed by her medical  doctor.   Disposition:  Length of Stay: 1  Thayer Headings, Brooke Bonito., MD, [facility] 07/21/2013, 9:54 AM Office [phone] Pager [phone]

## 2013-07-21 NOTE — Discharge Summary (Signed)
Physician Discharge Summary     Patient ID: Brooke Castillo MRN: [id number] DOB/AGE: 1945/05/24 68 y.o.  Admit date: 07/20/2013 Discharge date: 07/21/2013  PCP: Ron Agee, PA-C  Admission Diagnoses:  Chest pain  Discharge Diagnoses:  Active Problems:   Chest pain   Dizziness   HTN (hypertension)   HLD (hyperlipidemia)   Obesity   Discharged Condition: stable  Hospital Course:  The patient is a 68 y/o, moderately obese WF, with a history of HTN, HLD, borderline DM and asthma who presents to the [facility] ER with a complaint of substernal chest pain and dizziness. She denies any past cardiac issues, but has a family h/o of CAD, with her father suffering an MI with subsequent CABG at age 66. Her mother also had CHF. 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 She denies any history of tobacco use, past or present.   She states that she was in her usual state of health until yesterday. She noticed intermittent episodes of mild substernal chest pressure. Rated 3/10. Non radiating. No exacerbating factors. Occurred off and on throughout the day. Episodes lasted ~ 1-2 minutes each. Episodes occurred as rest with associated lightheadedness and mild SOB. No diaphoresis, n/v. She did not take any medicines for her discomfort.   Today, while driving on the highway, she developed sudden onset of dizziness. She denies syncope/ near syncope. She had recurrent mild chest discomfort and slight SOB, but denies any other symptoms. This particular episode lasted for ~30 minutes before spontaneously resolving. Her symptoms concerned her, prompting her to come to the ER. She received ASA and a nitro patch. Her symptoms have resolved, but she now has a headache.  W/u has revealed LBBB on EKG (no old EKG to compare to). Negative troponin x 1. CT of the head was unremarkable. CXR c/w minimal basilar atelectasis.   She was admitted and ruled out for MI.  Lexiscan  myovue revealed no ischemia and an EF 67%.  CT od her head was negative for acute abnormality.  Lipid panel revealed TG of 557.  She has an allergy to statin and niaspan.  She takes lovaza at home.  This was continued.  Her PCP manages the HLD and she was asked to follow up with him.  The patient was seen by Dr. Acie Fredrickson who felt she was stable for DC home.   Consults: None  Significant Diagnostic Studies:  MYOCARDIAL IMAGING WITH SPECT (REST AND PHARMACOLOGIC-STRESS)  GATED LEFT VENTRICULAR WALL MOTION STUDY  LEFT VENTRICULAR EJECTION FRACTION  TECHNIQUE: Standard myocardial SPECT imaging was performed after resting intravenous injection of 10 mCi Tc-64m sestamibi. Subsequently, intravenous infusion of Lexiscan was performed under the supervision of the Cardiology staff. At peak effect of the drug, 30 mCi Tc-72m sestamibi was injected intravenously and standard myocardial SPECT imaging was performed. Quantitative gated imaging was also performed to evaluate left ventricular wall motion, and estimate left ventricular ejection fraction.  COMPARISON: Prior chest x-ray 07/20/2013  FINDINGS: Evaluation of the cardiac gated data demonstrates an end-diastolic volume of 81 mL and an end systolic volume of 27 mL yielding a calculated ejection fraction of 67%. No evidence of global or focal cardiac wall motion abnormality.  Evaluation of the static resting and post pharmacological stress images demonstrate areas of fixed deficit in radiotracer uptake on both the rest and post stress images in the anteroseptal and inferolateral walls of the mid ventricle. No transient ischemic dilatation or other acute  abnormality.  IMPRESSION: 1. Negative for inducible/reversible ischemia. 2. Fixed defects in the mid ventricular anteroseptal and inferolateral walls may represent areas of prior infarct or scarring. The anteroseptal defect may very well represent breast attenuation artifact. There is no  significant associated cardiac wall motion abnormality. 3. Calculated ejection fraction 67%.  Lipid Panel     Component Value Date/Time   CHOL 234* 07/21/2013 0554   TRIG 557* 07/21/2013 0554   HDL 43 07/21/2013 0554   CHOLHDL 5.4 07/21/2013 0554   VLDL UNABLE TO CALCULATE IF TRIGLYCERIDE OVER 400 mg/dL 07/21/2013 0554   LDLCALC UNABLE TO CALCULATE IF TRIGLYCERIDE OVER 400 mg/dL 07/21/2013 0554    Cardiac Panel (last 3 results)  Recent Labs  07/21/13 0522 07/21/13 1204 07/21/13 1545  TROPONINI <0.30 <0.30 <0.30    Treatments: See above  Discharge Exam: Blood pressure 121/66, pulse 62, temperature 97.8 F (36.6 C), temperature source Oral, resp. rate 18, height 5\' 4"  (1.626 m), weight 223 lb (101.152 kg), SpO2 95.00%.   Disposition:       Discharge Orders   Future Orders Complete By Expires   Diet - low sodium heart healthy  As directed    Increase activity slowly  As directed        Medication List         acetaminophen 500 MG tablet  Commonly known as:  TYLENOL  Take 500 mg by mouth every 8 (eight) hours as needed for moderate pain.     albuterol 108 (90 BASE) MCG/ACT inhaler  Commonly known as:  PROVENTIL HFA;VENTOLIN HFA  Inhale 1-2 puffs into the lungs every 4 (four) hours as needed for wheezing or shortness of breath.     clotrimazole-betamethasone cream  Commonly known as:  LOTRISONE  Apply 1 application topically 2 (two) times daily as needed.     EPIPEN 0.3 mg/0.3 mL Soaj injection  Generic drug:  EPINEPHrine  Inject 0.3 mg into the muscle once.     fexofenadine 180 MG tablet  Commonly known as:  ALLEGRA  Take 180 mg by mouth daily.     Fluticasone-Salmeterol 250-50 MCG/DOSE Aepb  Commonly known as:  ADVAIR  Inhale 1 puff into the lungs 2 (two) times daily.     furosemide 40 MG tablet  Commonly known as:  LASIX  Take 40 mg by mouth daily.     lisinopril 10 MG tablet  Commonly known as:  PRINIVIL,ZESTRIL  Take 10 mg by mouth daily.      LORazepam 1 MG tablet  Commonly known as:  ATIVAN  Take 1 mg by mouth 2 (two) times daily as needed for anxiety.     montelukast 10 MG tablet  Commonly known as:  SINGULAIR  Take 10 mg by mouth daily.     naproxen 250 MG tablet  Commonly known as:  NAPROSYN  Take 250 mg by mouth 2 (two) times daily with a meal.     omega-3 acid ethyl esters 1 G capsule  Commonly known as:  LOVAZA  Take 4,000 mg by mouth daily.     pantoprazole 40 MG tablet  Commonly known as:  PROTONIX  Take 40 mg by mouth daily.       Follow-up Information   Follow up with Primary Care Provider. (Follow up with primary care provider)       Signed: Tarri Fuller 07/21/2013, 5:26 PM  Attending Note:   The patient was seen and examined.  Agree with assessment and plan as noted above.  Changes made to the above note as needed.  See my note from earlier today   Patient is doing well.  myoview is normal. Follow up with primary care. She can see [place] as needed.   Thayer Headings, Brooke Bonito., MD, [facility] 07/21/2013, 7:04 PM

## 2013-07-21 NOTE — Progress Notes (Signed)
Patient Name: Brooke Castillo Date of Encounter: 07/21/2013  Active Problems:   Chest pain   Dizziness   HTN (hypertension)   HLD (hyperlipidemia)   Obesity    Patient Profile: 68 yo female w/ elevated trig, HTN, DM, LBBB (age unknown) admitted w/ chest pain. Ez negative for MI  SUBJECTIVE: No chest pain or SOB now, her chest wall is tender, palpation reproduces symptoms.   OBJECTIVE Filed Vitals:   07/20/13 1930 07/20/13 2030 07/21/13 0546 07/21/13 0932  BP: 134/82 152/73 129/68 147/54  Pulse: 63 66 63   Temp:  97.6 F (36.4 C) 97.4 F (36.3 C)   TempSrc:  Oral Oral   Resp: 18 18 18    Height:  5\' 4"  (1.626 m)    Weight:  226 lb 9.6 oz (102.785 kg) 223 lb (101.152 kg)   SpO2: 94% 96% 96%     Intake/Output Summary (Last 24 hours) at 07/21/13 1022 Last data filed at 07/21/13 0546  Gross per 24 hour  Intake      0 ml  Output    600 ml  Net   -600 ml   Filed Weights   07/20/13 1353 07/20/13 2030 07/21/13 0546  Weight: 225 lb (102.059 kg) 226 lb 9.6 oz (102.785 kg) 223 lb (101.152 kg)    PHYSICAL EXAM General: Well developed, well nourished, female in no acute distress. Head: Normocephalic, atraumatic.  Neck: Supple without bruits, JVD not elevated. Lungs:  Resp regular and unlabored, CTA. Heart: RRR, S1, S2, no S3, S4, or murmur; no rub. Abdomen: Soft, non-tender, non-distended, BS + x 4.  Extremities: No clubbing, cyanosis, no edema.  Neuro: Alert and oriented X 3. Moves all extremities spontaneously. Psych: Normal affect.  LABS: CBC: Recent Labs  07/20/13 1441 07/21/13 0554  WBC 7.9 5.8  HGB 15.5* 14.2  HCT 44.1 42.0  MCV 90.2 90.7  PLT 234 676   Basic Metabolic Panel: Recent Labs  07/20/13 1441 07/21/13 0554  NA 139 139  K 4.4 3.9  CL 99 100  CO2 24 23  GLUCOSE 146* 100*  BUN 22 20  CREATININE 0.67 0.59  CALCIUM 9.2 9.0   Cardiac Enzymes: Recent Labs  07/21/13 0522  TROPONINI <0.30    Recent Labs  07/20/13 1447  TROPIPOC  0.00   Hemoglobin A1C: Recent Labs  07/20/13 1441  HGBA1C 6.2*   Fasting Lipid Panel: Recent Labs  07/21/13 0554  CHOL 234*  HDL 43  LDLCALC UNABLE TO CALCULATE IF TRIGLYCERIDE OVER 400 mg/dL  TRIG 557*  CHOLHDL 5.4   TELE:  SR, seen in nuc med      Radiology/Studies: Dg Chest 2 View 07/20/2013   CLINICAL DATA:  Dizziness and chest pressure  EXAM: CHEST  2 VIEW  COMPARISON:  06/21/2013  FINDINGS: Cardiac shadow is stable. The lungs are well aerated bilaterally. Minimal left basilar atelectasis is seen. No focal confluent infiltrate is noted. No acute bony abnormality is noted.  IMPRESSION: Minimal left basilar atelectasis.   Electronically Signed   By: Inez Catalina M.D.   On: 07/20/2013 15:14   Ct Head Wo Contrast 07/20/2013   CLINICAL DATA:  Dizziness  EXAM: CT HEAD WITHOUT CONTRAST  TECHNIQUE: Contiguous axial images were obtained from the base of the skull through the vertex without intravenous contrast.  COMPARISON:  CT 12/07/2012  FINDINGS: Ventricle size is normal. Negative for acute or chronic infarction. Negative for hemorrhage or fluid collection. Negative for mass or edema. No shift of  the midline structures.  Calvarium is intact.  IMPRESSION: Normal   Electronically Signed   By: Franchot Gallo M.D.   On: 07/20/2013 15:34     Current Medications:  . aspirin EC  81 mg Oral Daily  . furosemide  40 mg Oral Daily  . heparin  5,000 Units Subcutaneous 3 times per day  . lisinopril  10 mg Oral Daily  . mometasone-formoterol  2 puff Inhalation BID  . montelukast  10 mg Oral Daily  . omega-3 acid ethyl esters  4,000 mg Oral Daily  . pantoprazole  40 mg Oral Daily      ASSESSMENT AND PLAN: Active Problems:   Chest pain - ez negative for MI, Lexiscan stress today to assess for ischemia. No further cardiac w/u if negative.     Dizziness - CT OK, no symptoms now    HTN (hypertension) - can increase lisinopril if better BP control needed. Has not had AM meds today.     HLD  (hyperlipidemia) - profile above, f/u with primary MD.     Obesity - see below    DM - A1c 6.2, discussed diabetic diet w/ pt. Will change her to this.   Plan - d/c if CL negative.  Lemont Fillers , PA-C 10:22 AM 07/21/2013

## 2013-08-18 ENCOUNTER — Encounter: Payer: Self-pay | Admitting: Internal Medicine

## 2013-08-18 ENCOUNTER — Ambulatory Visit (INDEPENDENT_AMBULATORY_CARE_PROVIDER_SITE_OTHER): Payer: Medicare Other | Admitting: Internal Medicine

## 2013-08-18 VITALS — BP 152/91 | HR 65 | Ht 64.0 in | Wt 223.6 lb

## 2013-08-18 DIAGNOSIS — R42 Dizziness and giddiness: Secondary | ICD-10-CM

## 2013-08-18 DIAGNOSIS — I10 Essential (primary) hypertension: Secondary | ICD-10-CM

## 2013-08-18 DIAGNOSIS — E669 Obesity, unspecified: Secondary | ICD-10-CM

## 2013-08-18 DIAGNOSIS — Z789 Other specified health status: Secondary | ICD-10-CM

## 2013-08-18 DIAGNOSIS — E785 Hyperlipidemia, unspecified: Secondary | ICD-10-CM

## 2013-08-18 DIAGNOSIS — R079 Chest pain, unspecified: Secondary | ICD-10-CM

## 2013-08-18 LAB — LIPID PANEL
CHOL/HDL RATIO: 5.1 ratio
Cholesterol: 208 mg/dL — ABNORMAL HIGH (ref 0–200)
HDL: 41 mg/dL (ref >39)
LDL CALC: 92 mg/dL (ref 0–99)
TRIGLYCERIDES: 373 mg/dL — AB (ref <150)
VLDL: 75 mg/dL — AB (ref 0–40)

## 2013-08-18 LAB — LDL CHOLESTEROL, DIRECT: Direct LDL: 97 mg/dL

## 2013-08-18 MED ORDER — ROSUVASTATIN CALCIUM 5 MG PO TABS: 5.0000 mg | ORAL_TABLET | Freq: Every day | ORAL | Status: DC

## 2013-08-18 NOTE — Patient Instructions (Signed)
Your physician recommends that you return for lab work in: TODAY  Start Crestor 5mg  once daily.   Your physician recommends that you schedule a follow-up appointment in: 6 months.

## 2013-08-22 ENCOUNTER — Encounter: Payer: Self-pay | Admitting: Internal Medicine

## 2013-08-22 NOTE — Progress Notes (Signed)
LMTCB regarding lab results.  

## 2013-08-22 NOTE — Progress Notes (Signed)
OFFICE NOTE  Chief Complaint:  Hospital follow-up  Primary Care Physician: Ron Agee, PA-C  HPI:  Brooke Castillo is a 68 y/o, moderately obese WF, with a history of HTN, HLD, borderline DM and asthma who presents to the [facility] ER with a complaint of substernal chest pain and dizziness. She denies any past cardiac issues, but has a family h/o of CAD, with her father suffering an MI with subsequent CABG at age 32. Her mother also had CHF. 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 She denies any history of tobacco use, past or present. She states that she was in her usual state of health until yesterday. She noticed intermittent episodes of mild substernal chest pressure. Rated 3/10. Non radiating. No exacerbating factors. Occurred off and on throughout the day. Episodes lasted ~ 1-2 minutes each. Episodes occurred as rest with associated lightheadedness and mild SOB. No diaphoresis, n/v. She did not take any medicines for her discomfort. Today, while driving on the highway, she developed sudden onset of dizziness. She denies syncope/ near syncope. She had recurrent mild chest discomfort and slight SOB, but denies any other symptoms. This particular episode lasted for ~30 minutes before spontaneously resolving. Her symptoms concerned her, prompting her to come to the ER. She received ASA and a nitro patch. Her symptoms have resolved, but she now has a headache. W/u has revealed LBBB on EKG (no old EKG to compare to). Negative troponin x 1. CT of the head was unremarkable. CXR c/w minimal basilar atelectasis. She was admitted and ruled out for MI. Lexiscan myovue revealed no ischemia and an EF 67%. CT od her head was negative for acute abnormality. Lipid panel revealed TG of 557. She has an allergy to statin and niaspan. She takes lovaza at home.   In followup today she reports that she has had no further chest pain. She continues to have some anxiety  and occasionally gets lightheadedness and dizziness. She notes that her blood pressure did spike up to 190/97, but it is elevated only to 152/91 today. She has struggled with some lower extremity swelling.  PMHx:  Past Medical History  Diagnosis Date  . Acute renal failure 2004    accidently took too much aleve  . Diverticulitis   . Asthma   . Arthritis   . Hypertension   . Complication of anesthesia     IN 2011 I HAD COLON SURGERY & IT TOOK ME 2 DAYS TO WAKE UP   . GERD (gastroesophageal reflux disease)     Past Surgical History  Procedure Laterality Date  . Abdominal hysterectomy  1980    complete  . Appendectomy  1980  . Cholecystectomy  1989  . Knee surgery Bilateral 2009  . Colon surgery  2011    removed 11 inches of colon  . Tonsillectomy      FAMHx:  Family History  Problem Relation Age of Onset  . Coronary artery disease Father 27    CABGx5; brain stem stroke  . Heart failure Mother   . COPD Mother   . CAD Brother 64    stent  . Hypertension Sister   . Aneurysm Paternal Grandmother   . Heart disease Paternal Grandfather     SOCHx:   reports that she has never smoked. She has never used smokeless tobacco. She reports that she does not drink alcohol or use illicit drugs.  ALLERGIES:  Allergies  Allergen Reactions  .  Ciprofloxacin Hives  . Codeine Other (See Comments)    "knocks me out"   . Diflucan [Fluconazole] Hives  . Niaspan [Niacin Er] Other (See Comments)  . Statins Other (See Comments)    Joint pain  . Flagyl [Metronidazole] Palpitations    ROS: A comprehensive review of systems was negative except for: Cardiovascular: positive for lower extremity edema and palpitations Behavioral/Psych: positive for anxiety  HOME MEDS: Current Outpatient Prescriptions  Medication Sig Dispense Refill  . acetaminophen (TYLENOL) 500 MG tablet Take 500 mg by mouth every 8 (eight) hours as needed for moderate pain.      Marland Kitchen albuterol (PROVENTIL HFA;VENTOLIN  HFA) 108 (90 BASE) MCG/ACT inhaler Inhale 1-2 puffs into the lungs every 4 (four) hours as needed for wheezing or shortness of breath.      Marland Kitchen amoxicillin (AMOXIL) 875 MG tablet Take 3,500 mg by mouth as directed. Prior to dental procedures      . aspirin 325 MG tablet Take 325 mg by mouth daily.      . clotrimazole-betamethasone (LOTRISONE) cream Apply 1 application topically 2 (two) times daily as needed.       . Coenzyme Q10 (CO Q-10) 100 MG CAPS Take 1 capsule by mouth every morning.      . cyclobenzaprine (FLEXERIL) 10 MG tablet Take 10 mg by mouth 3 (three) times daily as needed for muscle spasms.      Marland Kitchen EPINEPHrine (EPIPEN) 0.3 mg/0.3 mL IJ SOAJ injection Inject 0.3 mg into the muscle once.      Marland Kitchen estradiol (ESTRACE) 1 MG tablet Take 1 mg by mouth daily.      . fenofibrate 160 MG tablet Take 160 mg by mouth daily.      . fexofenadine (ALLEGRA) 180 MG tablet Take 180 mg by mouth daily.      . Fluticasone-Salmeterol (ADVAIR) 250-50 MCG/DOSE AEPB Inhale 1 puff into the lungs 2 (two) times daily.      . furosemide (LASIX) 40 MG tablet Take 40 mg by mouth daily.      Marland Kitchen HYDROcodone-acetaminophen (NORCO/VICODIN) 5-325 MG per tablet Take 1-2 tablets by mouth every 6 (six) hours as needed for moderate pain.      Marland Kitchen lisinopril (PRINIVIL,ZESTRIL) 10 MG tablet Take 10 mg by mouth daily.      Marland Kitchen LORazepam (ATIVAN) 1 MG tablet Take 1 mg by mouth 2 (two) times daily as needed for anxiety.      . montelukast (SINGULAIR) 10 MG tablet Take 10 mg by mouth daily.      Marland Kitchen omega-3 acid ethyl esters (LOVAZA) 1 G capsule Take 4,000 mg by mouth daily.      . pantoprazole (PROTONIX) 40 MG tablet Take 40 mg by mouth daily.      . rosuvastatin (CRESTOR) 5 MG tablet Take 1 tablet (5 mg total) by mouth daily.  30 tablet  6   No current facility-administered medications for this visit.    LABS/IMAGING: No results found for this or any previous visit (from the past 48 hour(s)). No results found.  VITALS: BP 152/91   Pulse 65  Ht 5\' 4"  (1.626 m)  Wt 223 lb 9.6 oz (101.424 kg)  BMI 38.36 kg/m2  EXAM: General appearance: alert and no distress Neck: no carotid bruit and no JVD Lungs: clear to auscultation bilaterally Heart: regular rate and rhythm, S1, S2 normal, no murmur, click, rub or gallop Abdomen: soft, non-tender; bowel sounds normal; no masses,  no organomegaly Extremities: extremities normal, atraumatic, no cyanosis  or edema Pulses: 2+ and symmetric Skin: Skin color, texture, turgor normal. No rashes or lesions Neurologic: Grossly normal Psych: Mood, affect normal  EKG: None  ASSESSMENT: 1. Uncontrolled hypertension 2. Dyslipidemia 3. Recent chest pain with a negative stress test  PLAN: 1.   Mrs. Payano returns today and is feeling much better. Her blood pressure has spiked as an outpatient and she increased her lisinopril to 10 mg twice daily. I would recommend that she continue on this. Her cholesterol is not at goal. I would recommend a repeat lipid profile and start Crestor 5 mg daily today. She reported intolerance to statins including simvastatin, pravastatin and a tour of a statin, which she recently got in the hospital.  Plan to see her back in 6 months or sooner as necessary.  Pixie Casino, MD, [facility] Attending Cardiologist [place] 08/22/2013, 4:14 PM

## 2013-08-23 ENCOUNTER — Telehealth: Payer: Self-pay | Admitting: Internal Medicine

## 2013-08-23 NOTE — Telephone Encounter (Signed)
Returned call. Left VM with results.

## 2013-08-23 NOTE — Telephone Encounter (Signed)
Patient returning call to [facility] for results

## 2014-03-23 ENCOUNTER — Encounter: Payer: Self-pay | Admitting: Internal Medicine

## 2014-03-23 ENCOUNTER — Ambulatory Visit (INDEPENDENT_AMBULATORY_CARE_PROVIDER_SITE_OTHER): Payer: Medicare Other | Admitting: Internal Medicine

## 2014-03-23 VITALS — BP 126/80 | HR 65 | Ht 64.0 in | Wt 230.0 lb

## 2014-03-23 DIAGNOSIS — E669 Obesity, unspecified: Secondary | ICD-10-CM

## 2014-03-23 DIAGNOSIS — E785 Hyperlipidemia, unspecified: Secondary | ICD-10-CM

## 2014-03-23 DIAGNOSIS — I10 Essential (primary) hypertension: Secondary | ICD-10-CM

## 2014-03-23 MED ORDER — ROSUVASTATIN CALCIUM 5 MG PO TABS: 5.0000 mg | ORAL_TABLET | Freq: Every day | ORAL | Status: DC

## 2014-03-23 NOTE — Patient Instructions (Signed)
Your physician recommends that you return for lab work in: TODAY  Your physician wants you to follow-up in: 6 months. You will receive a reminder letter in the mail two months in advance. If you don't receive a letter, please call our office to schedule the follow-up appointment.

## 2014-03-23 NOTE — Progress Notes (Signed)
OFFICE NOTE  Chief Complaint:  No complaints  Primary Care Physician: Enid Skeens., MD  HPI:  Brooke Castillo is a 69 y/o, moderately obese WF, with a history of HTN, HLD, borderline DM and asthma who presents to the [facility] ER with a complaint of substernal chest pain and dizziness. She denies any past cardiac issues, but has a family h/o of CAD, with her father suffering an MI with subsequent CABG at age 102. Her mother also had CHF. 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 She denies any history of tobacco use, past or present. She states that she was in her usual state of health until yesterday. She noticed intermittent episodes of mild substernal chest pressure. Rated 3/10. Non radiating. No exacerbating factors. Occurred off and on throughout the day. Episodes lasted ~ 1-2 minutes each. Episodes occurred as rest with associated lightheadedness and mild SOB. No diaphoresis, n/v. She did not take any medicines for her discomfort. Today, while driving on the highway, she developed sudden onset of dizziness. She denies syncope/ near syncope. She had recurrent mild chest discomfort and slight SOB, but denies any other symptoms. This particular episode lasted for ~30 minutes before spontaneously resolving. Her symptoms concerned her, prompting her to come to the ER. She received ASA and a nitro patch. Her symptoms have resolved, but she now has a headache. W/u has revealed LBBB on EKG (no old EKG to compare to). Negative troponin x 1. CT of the head was unremarkable. CXR c/w minimal basilar atelectasis. She was admitted and ruled out for MI. Lexiscan myovue revealed no ischemia and an EF 67%. CT od her head was negative for acute abnormality. Lipid panel revealed TG of 557. She has an allergy to statin and niaspan. She takes lovaza at home.   In followup today she reports that she has had no further chest pain. She continues to have some anxiety and  occasionally gets lightheadedness and dizziness. She notes that her blood pressure did spike up to 190/97, but it is elevated only to 152/91 today. She has struggled with some lower extremity swelling.  I saw Mrs. Baity back in the office today. She's feeling quite well. She's had a number of different cholesterol test all indicating her cholesterol still not well controlled. She's been intolerant to most statins but recently is been taking Crestor 5 mg daily without any difficulty. Triglycerides also remained high. She also is on fenofibrate. She may be a good candidate for PCIS K9 inhibitor.  PMHx:  Past Medical History  Diagnosis Date  . Acute renal failure 2004    accidently took too much aleve  . Diverticulitis   . Asthma   . Arthritis   . Hypertension   . Complication of anesthesia     IN 2011 I HAD COLON SURGERY & IT TOOK ME 2 DAYS TO WAKE UP   . GERD (gastroesophageal reflux disease)     Past Surgical History  Procedure Laterality Date  . Abdominal hysterectomy  1980    complete  . Appendectomy  1980  . Cholecystectomy  1989  . Knee surgery Bilateral 2009  . Colon surgery  2011    removed 11 inches of colon  . Tonsillectomy      FAMHx:  Family History  Problem Relation Age of Onset  . Coronary artery disease Father 86    CABGx5; brain stem stroke  . Heart failure Mother   .  COPD Mother   . CAD Brother 52    stent  . Hypertension Sister   . Aneurysm Paternal Grandmother   . Heart disease Paternal Grandfather     SOCHx:   reports that she has never smoked. She has never used smokeless tobacco. She reports that she does not drink alcohol or use illicit drugs.  ALLERGIES:  Allergies  Allergen Reactions  . Ciprofloxacin Hives  . Codeine Other (See Comments)    "knocks me out"   . Diflucan [Fluconazole] Hives  . Niaspan [Niacin Er] Other (See Comments)  . Statins Other (See Comments)    Joint pain  . Flagyl [Metronidazole] Palpitations    ROS: A  comprehensive review of systems was negative except for: Cardiovascular: positive for lower extremity edema and palpitations Behavioral/Psych: positive for anxiety  HOME MEDS: Current Outpatient Prescriptions  Medication Sig Dispense Refill  . acetaminophen (TYLENOL) 500 MG tablet Take 500 mg by mouth every 8 (eight) hours as needed for moderate pain.    Marland Kitchen albuterol (PROVENTIL HFA;VENTOLIN HFA) 108 (90 BASE) MCG/ACT inhaler Inhale 1-2 puffs into the lungs every 4 (four) hours as needed for wheezing or shortness of breath.    Marland Kitchen amoxicillin (AMOXIL) 875 MG tablet Take 3,500 mg by mouth as directed. Prior to dental procedures    . aspirin 325 MG tablet Take 325 mg by mouth daily.    . clotrimazole-betamethasone (LOTRISONE) cream Apply 1 application topically 2 (two) times daily as needed.     . Coenzyme Q10 (CO Q-10) 100 MG CAPS Take 1 capsule by mouth every morning.    . cyclobenzaprine (FLEXERIL) 10 MG tablet Take 10 mg by mouth 3 (three) times daily as needed for muscle spasms.    Marland Kitchen EPINEPHrine (EPIPEN) 0.3 mg/0.3 mL IJ SOAJ injection Inject 0.3 mg into the muscle once.    Marland Kitchen estradiol (ESTRACE) 1 MG tablet Take 1 mg by mouth daily.    . fenofibrate 160 MG tablet Take 160 mg by mouth daily.    . fexofenadine (ALLEGRA) 180 MG tablet Take 180 mg by mouth daily.    . Fluticasone-Salmeterol (ADVAIR) 250-50 MCG/DOSE AEPB Inhale 1 puff into the lungs 2 (two) times daily.    . furosemide (LASIX) 40 MG tablet Take 40 mg by mouth daily.    Marland Kitchen HYDROcodone-acetaminophen (NORCO/VICODIN) 5-325 MG per tablet Take 1-2 tablets by mouth every 6 (six) hours as needed for moderate pain.    Marland Kitchen LORazepam (ATIVAN) 1 MG tablet Take 1 mg by mouth 2 (two) times daily as needed for anxiety.    . montelukast (SINGULAIR) 10 MG tablet Take 10 mg by mouth daily.    . OMEGA-3 KRILL OIL PO Take 1 capsule by mouth daily.    . pantoprazole (PROTONIX) 40 MG tablet Take 40 mg by mouth daily.    . rosuvastatin (CRESTOR) 5 MG tablet  Take 1 tablet (5 mg total) by mouth daily. 28 tablet 0  . lisinopril (PRINIVIL,ZESTRIL) 20 MG tablet Take 20 mg by mouth daily.  11   No current facility-administered medications for this visit.    LABS/IMAGING: No results found for this or any previous visit (from the past 48 hour(s)). No results found.  VITALS: BP 126/80 mmHg  Pulse 65  Ht 5\' 4"  (1.626 m)  Wt 230 lb (104.327 kg)  BMI 39.46 kg/m2  EXAM: General appearance: alert and no distress Neck: no carotid bruit and no JVD Lungs: clear to auscultation bilaterally Heart: regular rate and rhythm, S1, S2  normal, no murmur, click, rub or gallop Abdomen: soft, non-tender; bowel sounds normal; no masses,  no organomegaly Extremities: extremities normal, atraumatic, no cyanosis or edema Pulses: 2+ and symmetric Skin: Skin color, texture, turgor normal. No rashes or lesions Neurologic: Grossly normal Psych: Mood, affect normal  EKG: Normal sinus rhythm at 65  ASSESSMENT: 1. Hypertension-controlled 2. Dyslipidemia - not at goal, on max tolerated dose statin and fenofibrate 3. Recent chest pain with a negative stress test  PLAN: 1.   Mrs. Riedinger is much better blood pressure control at this time. Her cholesterol still like goal. I would like to recheck a lipid profile today. She may be a good candidate for PCSK9 inhibitor as she is on max tolerated dose of statin and fenofibrate. She denies any further chest pain.  Plan to see her back in 6 months or sooner as necessary.  Pixie Casino, MD, [facility] Attending Cardiologist [facility]  HILTY,Kenneth C 03/23/2014, 2:22 PM

## 2014-03-25 LAB — NMR LIPOPROFILE WITH LIPIDS
CHOLESTEROL, TOTAL: 205 mg/dL — AB (ref 100–199)
HDL PARTICLE NUMBER: 49.8 umol/L (ref >=30.5)
HDL Size: 8.6 nm — ABNORMAL LOW (ref >=9.2)
HDL-C: 52 mg/dL (ref >39)
LDL (calc): 98 mg/dL (ref 0–99)
LDL Particle Number: 1707 nmol/L — ABNORMAL HIGH (ref <1000)
LDL SIZE: 19.8 nm (ref >=20.8)
LP-IR SCORE: 100 — AB (ref <=45)
Large HDL-P: 5.2 umol/L (ref >=4.8)
Large VLDL-P: 16.8 nmol/L — ABNORMAL HIGH (ref <=2.7)
Small LDL Particle Number: 1345 nmol/L — ABNORMAL HIGH (ref <=527)
TRIGLYCERIDES: 276 mg/dL — AB (ref 0–149)
VLDL Size: 66.4 nm — ABNORMAL HIGH (ref <=46.6)

## 2014-07-05 ENCOUNTER — Telehealth: Payer: Self-pay | Admitting: Internal Medicine

## 2014-07-06 NOTE — Telephone Encounter (Signed)
Close encounter 

## 2014-10-12 ENCOUNTER — Ambulatory Visit: Payer: Medicare Other | Admitting: Internal Medicine

## 2014-12-07 ENCOUNTER — Ambulatory Visit: Payer: Medicare Other | Admitting: Internal Medicine

## 2015-01-01 ENCOUNTER — Other Ambulatory Visit: Payer: Self-pay | Admitting: Internal Medicine

## 2015-04-30 NOTE — Progress Notes (Signed)
Cardiology Office Note:    Date:  05/01/2015   ID:  Brooke Castillo, DOB [birth year], MRN [id number]  PCP:  Annetta Maw, MD  Cardiologist:  Dr. K. Mali Hilty   Electrophysiologist:  n/a  Chief Complaint  Patient presents with  . Shortness of Breath    History of Present Illness:     Brooke Castillo is a 70 y.o. female with a hx of HTN, HL with high Trigs, borderline DM2, asthma.  She is intol to statins.  She was seen in the ED in 2015 for chest pain. Myoview was done and this was neg for ischemia and EF was normal.  Last seen by Dr. Raliegh Ip. Mali Hilty in 1/16.    She recently had a FU surveillance colonoscopy given her hx of diverticulosis and partial colectomy.  During the procedure she was noted be develop a LBBB.  Her GI referred her to Cardiology in [place] for evaluation .  She was seen by Dr. Lawson Radar at [facility] in [facility] 04/27/15.  Echo, Carotid US, ABIs and Nuclear stress tests were ordered.  An echo was done and this was normal.    Her daughter in law is Brooke Castillo who works in the cath lab at [employer].  She requested that she FU here today. The patient tells me that she has been fatigued for the last several months and has noted worsening DOE.  She has occasional chest tightness.  This can occur at rest or with exertion.  She describes occasional (unrelated) sharp L jaw pain. However, she does have some L arm discomfort with her CP.  She denies assoc nausea or diaphoresis.  She denies orthopnea, PND.  She has chronic pedal edema without change.  She denies syncope.  She denies cough, wheezing or significant weight change.  She denies any increase in her asthma symptoms. Of note, she has occasional abdominal pain in the AM with assoc nausea followed by bouts of diarrhea.  Denies melena, hematochezia, vomiting.     Past Medical History  Diagnosis Date  . History of renal failure 2004    accidently took too much aleve  . Diverticulitis     s/p partial colectomy    . Asthma   . Arthritis   . Hypertension   . Complication of anesthesia     IN 2011 I HAD COLON SURGERY & IT TOOK ME 2 DAYS TO WAKE UP   . GERD (gastroesophageal reflux disease)   . Rheumatic fever     as a child  . HLD (hyperlipidemia)   . DM2 (diabetes mellitus, type 2) (HCC)     A1c in 12/16 was 6.6  . LBBB (left bundle branch block)     transient  . History of nuclear stress test     a. Myoview 5/15: no ischemia, EF 67%  . History of echocardiogram     a. Echo 2/17 ([facility]):  EF 55-60%    Past Surgical History  Procedure Laterality Date  . Abdominal hysterectomy  1980    complete  . Appendectomy  1980  . Cholecystectomy  1989  . Knee surgery Bilateral 2009  . Colon surgery  2011    removed 11 inches of colon  . Tonsillectomy      Current Medications: Outpatient Prescriptions Prior to Visit  Medication Sig Dispense Refill  . acetaminophen (TYLENOL) 500 MG tablet Take 500 mg by mouth every 8 (eight) hours as needed for moderate pain.    Marland Kitchen  albuterol (PROVENTIL HFA;VENTOLIN HFA) 108 (90 BASE) MCG/ACT inhaler Inhale 1-2 puffs into the lungs every 4 (four) hours as needed for wheezing or shortness of breath.    Marland Kitchen amoxicillin (AMOXIL) 875 MG tablet Take 3,500 mg by mouth as directed. Prior to dental procedures    . aspirin 325 MG tablet Take 325 mg by mouth daily.    . clotrimazole-betamethasone (LOTRISONE) cream Apply 1 application topically 2 (two) times daily as needed (FOR YEAST INFECTION).     . Coenzyme Q10 (CO Q-10) 100 MG CAPS Take 1 capsule by mouth every morning.    . cyclobenzaprine (FLEXERIL) 10 MG tablet Take 10 mg by mouth 3 (three) times daily as needed for muscle spasms.    Marland Kitchen EPINEPHrine (EPIPEN) 0.3 mg/0.3 mL IJ SOAJ injection Inject 0.3 mg into the muscle once as needed (FOR ANAPHYLAXIS).     Marland Kitchen estradiol (ESTRACE) 1 MG tablet Take 1 mg by mouth daily.    . fenofibrate 160 MG tablet Take 160 mg by mouth daily.    . fexofenadine (ALLEGRA) 180 MG  tablet Take 180 mg by mouth daily.    . Fluticasone-Salmeterol (ADVAIR) 250-50 MCG/DOSE AEPB Inhale 1 puff into the lungs 2 (two) times daily.    . furosemide (LASIX) 40 MG tablet Take 40 mg by mouth daily.    Marland Kitchen HYDROcodone-acetaminophen (NORCO/VICODIN) 5-325 MG per tablet Take 1-2 tablets by mouth every 6 (six) hours as needed for moderate pain.    Marland Kitchen lisinopril (PRINIVIL,ZESTRIL) 20 MG tablet Take 20 mg by mouth daily.  11  . LORazepam (ATIVAN) 1 MG tablet Take 1 mg by mouth 2 (two) times daily as needed for anxiety.    . montelukast (SINGULAIR) 10 MG tablet Take 10 mg by mouth daily.    . pantoprazole (PROTONIX) 40 MG tablet Take 40 mg by mouth daily.    . rosuvastatin (CRESTOR) 5 MG tablet Take 1 tablet (5 mg total) by mouth daily. 28 tablet 0  . OMEGA-3 KRILL OIL PO Take 1 capsule by mouth daily. Reported on 05/01/2015     No facility-administered medications prior to visit.     Allergies:   Ciprofloxacin; Codeine; Diflucan; Niaspan; Statins; and Flagyl   Social History   Social History  . Marital Status: Married    Spouse Name: N/A  . Number of Children: N/A  . Years of Education: N/A   Social History Main Topics  . Smoking status: Never Smoker   . Smokeless tobacco: Never Used  . Alcohol Use: No  . Drug Use: No  . Sexual Activity: Not Asked   Other Topics Concern  . None   Social History Narrative     Family History:  The patient's family history includes Aneurysm in her paternal grandmother; CAD (age of onset: 63) in her brother; COPD in her mother; Coronary artery disease (age of onset: 62) in her father; Heart disease in her paternal grandfather; Heart failure in her mother; Hypertension in her sister.   ROS:   Please see the history of present illness.    Review of Systems  Constitution: Positive for malaise/fatigue.  Respiratory: Positive for snoring.   Musculoskeletal: Positive for joint pain.  All other systems reviewed and are negative.   Physical Exam:      VS:  BP 140/80 mmHg  Pulse 62  Ht 5\' 4"  (1.626 m)  Wt 228 lb 6.4 oz (103.602 kg)  BMI 39.19 kg/m2   GEN: Well nourished, well developed, in no acute distress HEENT:  normal Neck: no JVD, no masses Cardiac: Normal S1/S2, RRR; no murmurs, trace bilateral ankle edema;  no carotid bruits,   Respiratory:  clear to auscultation bilaterally; no wheezing, rhonchi or rales GI: soft, nontender + BS MS: no deformity or atrophy Skin: warm and dry, no rash Neuro:  no focal deficits  Psych: Alert and oriented x 3, normal affect  Wt Readings from Last 3 Encounters:  05/01/15 228 lb 6.4 oz (103.602 kg)  03/23/14 230 lb (104.327 kg)  08/18/13 223 lb 9.6 oz (101.424 kg)      Studies/Labs Reviewed:     EKG:  EKG is  ordered today.  The ekg ordered today demonstrates NSR, HR 61, LAD, NSSTTW changes, septal Q waves, QTc 430 ms.  No change from prior tracing.  ECG from 5/15 was reviewed - this demonstrated LBBB ECG from 10/25/14 was reviewed and demonstrated LBBB ECG from 03/23/14 was reviewed and demonstrated NSR with narrow QRS  Recent Labs: No results found for requested labs within last 365 days.  From PCP in [place] (12/16):  A1c 6.6, LDL 69, TC 191, HDL 50, TG 390  Recent Lipid Panel    Component Value Date/Time   CHOL 205* 03/23/2014 1216   CHOL 208* 08/18/2013 0906   TRIG 276* 03/23/2014 1216   TRIG 373* 08/18/2013 0906   HDL 52 03/23/2014 1216   HDL 41 08/18/2013 0906   CHOLHDL 5.1 08/18/2013 0906   VLDL 75* 08/18/2013 0906   LDLCALC 98 03/23/2014 1216   LDLCALC 92 08/18/2013 0906   LDLDIRECT 97 08/18/2013 0906    Additional studies/ records that were reviewed today include:   Echo 04/27/15 [facility]) EF 55-60% No significant valvular abnormalities  Myoview 5/15 IMPRESSION: 1. Negative for inducible/reversible ischemia. 2. Fixed defects in the mid ventricular anteroseptal and inferolateral walls may represent areas of prior infarct or scarring. The anteroseptal  defect may very well represent breast attenuation artifact. There is no significant associated cardiac wall motion abnormality. 3. Calculated ejection fraction 67%   ASSESSMENT:     1. Shortness of breath   2. Other chest pain   3. LBBB (left bundle branch block)   4. Essential hypertension   5. Hyperlipidemia   6. Controlled type 2 diabetes mellitus without complication, without long-term current use of insulin ([place])   7. Snoring     PLAN:     In order of problems listed above:  1. Dyspnea - She notes progressively worsening DOE over the past few mos.  She is NYHA 2-2b.  She does not have any evidence of volume excess on exam.  Her echo done last week in [place] did not demonstrate any signs of diastolic dysfunction.  She does have Asthma, but her symptoms do not seem to be related to this.  She has a transient LBBB and this has been documented in the past.  CRFs include FHx, HL, HTN, DM2, obesity.  She has also had some atypical chest pain.    -  Obtain BNP >> increase Lasix if elevated.   -  Arrange Lexiscan Myoview  2. Chest pain - As noted, her CP is somewhat atypical.  ECG does not demonstrate any new findings.  LBBB is old.  Arrange nuclear stress test as noted.    3. LBBB - As noted, LBBB is transient and has been documented in the past.  Arrange Myoview as noted.  4. HTN - BP borderline.  Continue to monitor.  Continue current Rx.   5.  HL - Managed by PCP. LDL is good.  Her last Trigs remained elevated.  6. DM2 - Diet controlled.   7. Snoring - She has fatigue and + snoring.  I suspect she has sleep apnea.  Sleep testing should be considered at FU once a disposition has been made from the results of her stress test.     I spent 50 minutes reviewing records (approx 50 pages to review), evaluating the patient and coordinating care.   Face to Face time > 50%   Medication Adjustments/Labs and Tests Ordered: Current medicines are reviewed at length with the patient today.   Concerns regarding medicines are outlined above.  Medication changes, Labs and Tests ordered today are outlined in the Patient Instructions noted below. Patient Instructions  Medication Instructions:  Your physician recommends that you continue on your current medications as directed. Please refer to the Current Medication list given to you today.  Labwork: Today we will check a BNP.  Testing/Procedures: Your physician has requested that you have a lexiscan myoview. For further information please visit [email or web address]. Please follow instruction sheet, as given.  Follow-Up: Your physician recommends that you schedule a follow-up appointment in: 2 to 3 weeks with Dr. Debara Pickett  Any Other Special Instructions Will Be Listed Below (If Applicable).  If you need a refill on your cardiac medications before your next appointment, please call your pharmacy.  Signed, Richardson Dopp, PA-C  05/01/2015 1:09 PM    [facility] [address] [place], [place], [place]  [address] Phone: [phone]; Fax: [phone]

## 2015-05-01 ENCOUNTER — Ambulatory Visit (INDEPENDENT_AMBULATORY_CARE_PROVIDER_SITE_OTHER): Payer: Medicare Other | Admitting: Physician Assistant

## 2015-05-01 ENCOUNTER — Encounter: Payer: Self-pay | Admitting: Physician Assistant

## 2015-05-01 ENCOUNTER — Ambulatory Visit: Payer: Medicare Other | Admitting: Physician Assistant

## 2015-05-01 VITALS — BP 140/80 | HR 62 | Ht 64.0 in | Wt 228.4 lb

## 2015-05-01 DIAGNOSIS — I10 Essential (primary) hypertension: Secondary | ICD-10-CM

## 2015-05-01 DIAGNOSIS — R0602 Shortness of breath: Secondary | ICD-10-CM

## 2015-05-01 DIAGNOSIS — R0789 Other chest pain: Secondary | ICD-10-CM | POA: Diagnosis not present

## 2015-05-01 DIAGNOSIS — R0683 Snoring: Secondary | ICD-10-CM

## 2015-05-01 DIAGNOSIS — I447 Left bundle-branch block, unspecified: Secondary | ICD-10-CM | POA: Diagnosis not present

## 2015-05-01 DIAGNOSIS — E119 Type 2 diabetes mellitus without complications: Secondary | ICD-10-CM

## 2015-05-01 DIAGNOSIS — E785 Hyperlipidemia, unspecified: Secondary | ICD-10-CM

## 2015-05-01 NOTE — Patient Instructions (Addendum)
Medication Instructions:  Your physician recommends that you continue on your current medications as directed. Please refer to the Current Medication list given to you today.  Labwork: Today we will check a BNP.  Testing/Procedures: Your physician has requested that you have a lexiscan myoview. For further information please visit [email or web address]. Please follow instruction sheet, as given.  Follow-Up: Your physician recommends that you schedule a follow-up appointment in: 2 to 3 weeks with Dr. Debara Pickett  Any Other Special Instructions Will Be Listed Below (If Applicable).  If you need a refill on your cardiac medications before your next appointment, please call your pharmacy.

## 2015-05-02 ENCOUNTER — Telehealth: Payer: Self-pay | Admitting: *Deleted

## 2015-05-02 LAB — BRAIN NATRIURETIC PEPTIDE: Brain Natriuretic Peptide: 68 pg/mL (ref <100)

## 2015-05-02 NOTE — Telephone Encounter (Signed)
Pt has been notified of results by phone with verbal understanding.

## 2015-05-08 ENCOUNTER — Telehealth (HOSPITAL_COMMUNITY): Payer: Self-pay | Admitting: *Deleted

## 2015-05-08 NOTE — Telephone Encounter (Signed)
Patient given detailed instructions per Myocardial Perfusion Study Information Sheet for the test on 05/10/15 at 715. Patient notified to arrive 15 minutes early and that it is imperative to arrive on time for appointment to keep from having the test rescheduled.  If you need to cancel or reschedule your appointment, please call the office within 24 hours of your appointment. Failure to do so may result in a cancellation of your appointment, and a $50 no show fee. Patient verbalized understanding.Hubbard Robinson, RN

## 2015-05-10 ENCOUNTER — Ambulatory Visit (HOSPITAL_COMMUNITY): Payer: Medicare Other | Attending: Cardiovascular Disease

## 2015-05-10 DIAGNOSIS — I10 Essential (primary) hypertension: Secondary | ICD-10-CM | POA: Diagnosis not present

## 2015-05-10 DIAGNOSIS — R0789 Other chest pain: Secondary | ICD-10-CM | POA: Insufficient documentation

## 2015-05-10 DIAGNOSIS — R0602 Shortness of breath: Secondary | ICD-10-CM | POA: Diagnosis present

## 2015-05-10 DIAGNOSIS — R0609 Other forms of dyspnea: Secondary | ICD-10-CM | POA: Diagnosis not present

## 2015-05-10 DIAGNOSIS — E119 Type 2 diabetes mellitus without complications: Secondary | ICD-10-CM | POA: Diagnosis not present

## 2015-05-10 DIAGNOSIS — I447 Left bundle-branch block, unspecified: Secondary | ICD-10-CM

## 2015-05-10 DIAGNOSIS — R9439 Abnormal result of other cardiovascular function study: Secondary | ICD-10-CM | POA: Diagnosis not present

## 2015-05-10 MED ORDER — TECHNETIUM TC 99M SESTAMIBI GENERIC - CARDIOLITE
33.0000 | Freq: Once | INTRAVENOUS | Status: AC | PRN
  Administered 2015-05-10: 33 via INTRAVENOUS

## 2015-05-10 MED ORDER — REGADENOSON 0.4 MG/5ML IV SOLN
0.4000 mg | Freq: Once | INTRAVENOUS | Status: AC
Start: 2015-05-10 — End: 2015-05-10
  Administered 2015-05-10: 0.4 mg via INTRAVENOUS

## 2015-05-15 ENCOUNTER — Ambulatory Visit (HOSPITAL_COMMUNITY): Payer: Medicare Other | Attending: Internal Medicine

## 2015-05-15 LAB — MYOCARDIAL PERFUSION IMAGING
CHL CUP NUCLEAR SRS: 12
CHL CUP NUCLEAR SSS: 8
LV sys vol: 28 mL
LVDIAVOL: 76 mL
Peak HR: 82 {beats}/min
RATE: 0.37
Rest HR: 61 {beats}/min
SDS: 2
TID: 0.89

## 2015-05-15 MED ORDER — TECHNETIUM TC 99M SESTAMIBI GENERIC - CARDIOLITE
32.7000 | Freq: Once | INTRAVENOUS | Status: AC | PRN
  Administered 2015-05-15: 32.7 via INTRAVENOUS

## 2015-05-16 ENCOUNTER — Encounter: Payer: Self-pay | Admitting: Physician Assistant

## 2015-05-16 ENCOUNTER — Telehealth: Payer: Self-pay | Admitting: *Deleted

## 2015-05-16 NOTE — Telephone Encounter (Signed)
could not lmom. I will try again tomorrow.

## 2015-05-17 ENCOUNTER — Encounter: Payer: Self-pay | Admitting: Internal Medicine

## 2015-05-17 ENCOUNTER — Ambulatory Visit (INDEPENDENT_AMBULATORY_CARE_PROVIDER_SITE_OTHER): Payer: Medicare Other | Admitting: Internal Medicine

## 2015-05-17 VITALS — BP 132/70 | HR 76 | Ht 64.0 in | Wt 232.3 lb

## 2015-05-17 DIAGNOSIS — R072 Precordial pain: Secondary | ICD-10-CM | POA: Diagnosis not present

## 2015-05-17 DIAGNOSIS — E785 Hyperlipidemia, unspecified: Secondary | ICD-10-CM

## 2015-05-17 DIAGNOSIS — I10 Essential (primary) hypertension: Secondary | ICD-10-CM | POA: Diagnosis not present

## 2015-05-17 DIAGNOSIS — E669 Obesity, unspecified: Secondary | ICD-10-CM

## 2015-05-17 NOTE — Patient Instructions (Signed)
Medication Instructions:  Your physician recommends that you continue on your current medications as directed. Please refer to the Current Medication list given to you today. no  Labwork: None ordered  Testing/Procedures: None ordered  Follow-Up: Your physician wants you to follow-up in: 6 months You will receive a reminder letter in the mail two months in advance. If you don't receive a letter, please call our office to schedule the follow-up appointment.   Any Other Special Instructions Will Be Listed Below (If Applicable). Please purchase and wear compression stockings daily     If you need a refill on your cardiac medications before your next appointment, please call your pharmacy.

## 2015-05-17 NOTE — Progress Notes (Signed)
OFFICE NOTE  Chief Complaint:  Follow-up stress test  Primary Care Physician: Annetta Maw, MD  HPI:  RINNAH BEILSTEIN is a 70 y/o, moderately obese WF, with a history of HTN, HLD, borderline DM and asthma who presents to the [facility] ER with a complaint of substernal chest pain and dizziness. She denies any past cardiac issues, but has a family h/o of CAD, with her father suffering an MI with subsequent CABG at age 51. Her mother also had CHF. 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 She denies any history of tobacco use, past or present. She states that she was in her usual state of health until yesterday. She noticed intermittent episodes of mild substernal chest pressure. Rated 3/10. Non radiating. No exacerbating factors. Occurred off and on throughout the day. Episodes lasted ~ 1-2 minutes each. Episodes occurred as rest with associated lightheadedness and mild SOB. No diaphoresis, n/v. She did not take any medicines for her discomfort. Today, while driving on the highway, she developed sudden onset of dizziness. She denies syncope/ near syncope. She had recurrent mild chest discomfort and slight SOB, but denies any other symptoms. This particular episode lasted for ~30 minutes before spontaneously resolving. Her symptoms concerned her, prompting her to come to the ER. She received ASA and a nitro patch. Her symptoms have resolved, but she now has a headache. W/u has revealed LBBB on EKG (no old EKG to compare to). Negative troponin x 1. CT of the head was unremarkable. CXR c/w minimal basilar atelectasis. She was admitted and ruled out for MI. Lexiscan myovue revealed no ischemia and an EF 67%. CT od her head was negative for acute abnormality. Lipid panel revealed TG of 557. She has an allergy to statin and niaspan. She takes lovaza at home.   In followup today she reports that she has had no further chest pain. She continues to have some anxiety  and occasionally gets lightheadedness and dizziness. She notes that her blood pressure did spike up to 190/97, but it is elevated only to 152/91 today. She has struggled with some lower extremity swelling.  I saw Mrs. Hannig back in the office today. She's feeling quite well. She's had a number of different cholesterol test all indicating her cholesterol still not well controlled. She's been intolerant to most statins but recently is been taking Crestor 5 mg daily without any difficulty. Triglycerides also remained high. She also is on fenofibrate. She may be a good candidate for PCIS K9 inhibitor.  Mrs. Socha returns today for follow-up. Unfortunately she's recently had some abnormal findings which prompted cardiovascular evaluation. She had been complaining of some tightness around her neck which is interpreted as possible chest pain. She's also had intermittent significant swelling in her lower extremities although has been up on her feet a lot helping her granddaughter move and sitting for long periods of time. Recently she had a problem with swallowing and underwent endoscopy and colonoscopy. During the procedure apparently she had an abnormal EKG finding and she was referred to see a cardiologist with [facility]. They had recommended numerous tests and possibly even a pacemaker. She declined doing those tests and ultimately decided to make a follow-up appointment with me. She saw Richardson Dopp my PA who did an extensive workup and recommended a nuclear stress test given her history of left bundle branch block in the past. This was recently performed was negative again for  ischemia with normal LV function. She reports now that she feels well.  PMHx:  Past Medical History  Diagnosis Date  . History of renal failure 2004    accidently took too much aleve  . Diverticulitis     s/p partial colectomy  . Asthma   . Arthritis   . Hypertension   . Complication of anesthesia     IN 2011 I HAD  COLON SURGERY & IT TOOK ME 2 DAYS TO WAKE UP   . GERD (gastroesophageal reflux disease)   . Rheumatic fever     as a child  . HLD (hyperlipidemia)   . DM2 (diabetes mellitus, type 2) (HCC)     A1c in 12/16 was 6.6  . LBBB (left bundle branch block)     transient  . History of nuclear stress test     a. Myoview 5/15: no ischemia, EF 67%  //  b. Myoview 3/17: EF 63%, septal defect c/w artifact, no ischemia; Low Risk  . History of echocardiogram     a. Echo 2/17 ([facility]):  EF 55-60%    Past Surgical History  Procedure Laterality Date  . Abdominal hysterectomy  1980    complete  . Appendectomy  1980  . Cholecystectomy  1989  . Knee surgery Bilateral 2009  . Colon surgery  2011    removed 11 inches of colon  . Tonsillectomy      FAMHx:  Family History  Problem Relation Age of Onset  . Coronary artery disease Father 78    CABGx5; brain stem stroke  . Heart failure Mother   . COPD Mother   . CAD Brother 93    stent  . Hypertension Sister   . Aneurysm Paternal Grandmother   . Heart disease Paternal Grandfather     SOCHx:   reports that she has never smoked. She has never used smokeless tobacco. She reports that she does not drink alcohol or use illicit drugs.  ALLERGIES:  Allergies  Allergen Reactions  . Ciprofloxacin Hives  . Codeine Other (See Comments)    "knocks me out"   . Diflucan [Fluconazole] Hives  . Niaspan [Niacin Er] Other (See Comments)  . Statins Other (See Comments)    Joint pain  . Flagyl [Metronidazole] Palpitations    ROS: Pertinent items noted in HPI and remainder of comprehensive ROS otherwise negative.  HOME MEDS: Current Outpatient Prescriptions  Medication Sig Dispense Refill  . acetaminophen (TYLENOL) 500 MG tablet Take 500 mg by mouth every 8 (eight) hours as needed for moderate pain.    Marland Kitchen albuterol (PROVENTIL HFA;VENTOLIN HFA) 108 (90 BASE) MCG/ACT inhaler Inhale 1-2 puffs into the lungs every 4 (four) hours as needed for  wheezing or shortness of breath.    Marland Kitchen amoxicillin (AMOXIL) 875 MG tablet Take 3,500 mg by mouth as directed. Prior to dental procedures    . aspirin 325 MG tablet Take 325 mg by mouth daily.    . cholecalciferol (VITAMIN D) 1000 units tablet Take 1,000 Units by mouth daily.    . clotrimazole-betamethasone (LOTRISONE) cream Apply 1 application topically 2 (two) times daily as needed (FOR YEAST INFECTION).     . Coenzyme Q10 (CO Q-10) 100 MG CAPS Take 1 capsule by mouth every morning.    . cyclobenzaprine (FLEXERIL) 10 MG tablet Take 10 mg by mouth 3 (three) times daily as needed for muscle spasms.    Marland Kitchen EPINEPHrine (EPIPEN) 0.3 mg/0.3 mL IJ SOAJ injection Inject 0.3 mg into the  muscle once as needed (FOR ANAPHYLAXIS).     Marland Kitchen estradiol (ESTRACE) 1 MG tablet Take 1 mg by mouth daily.    . fenofibrate 160 MG tablet Take 160 mg by mouth daily.    . fexofenadine (ALLEGRA) 180 MG tablet Take 180 mg by mouth daily.    . Fluticasone-Salmeterol (ADVAIR) 250-50 MCG/DOSE AEPB Inhale 1 puff into the lungs 2 (two) times daily.    . furosemide (LASIX) 40 MG tablet Take 40 mg by mouth daily.    Marland Kitchen HYDROcodone-acetaminophen (NORCO/VICODIN) 5-325 MG per tablet Take 1-2 tablets by mouth every 6 (six) hours as needed for moderate pain.    Marland Kitchen lisinopril (PRINIVIL,ZESTRIL) 20 MG tablet Take 20 mg by mouth daily.  11  . LORazepam (ATIVAN) 1 MG tablet Take 1 mg by mouth 2 (two) times daily as needed for anxiety.    . montelukast (SINGULAIR) 10 MG tablet Take 10 mg by mouth daily.    . pantoprazole (PROTONIX) 40 MG tablet Take 40 mg by mouth daily.    . rosuvastatin (CRESTOR) 5 MG tablet Take 1 tablet (5 mg total) by mouth daily. 28 tablet 0   No current facility-administered medications for this visit.    LABS/IMAGING: No results found for this or any previous visit (from the past 48 hour(s)). No results found.  VITALS: BP 132/70 mmHg  Pulse 76  Ht 5\' 4"  (1.626 m)  Wt 232 lb 4.8 oz (105.371 kg)  BMI 39.85  kg/m2  EXAM: Extremities: edema Trace, mild varicose veins .  EKG: Deferred  ASSESSMENT: 1. Hypertension-controlled 2. Dyslipidemia - not at goal, on max tolerated dose statin and fenofibrate 3. LBBB 4. LE edema 5. Recent chest pain with a negative stress test, normal LV function  PLAN: 1.   Mrs. Eriksen had some recent neck and mild chest discomfort. She says she has very infrequent fluttering of her chest which lasts only a few seconds and can occur every few months. She did not describe pain that sounded like angina to me. She really does not endorse jaw pain rather some discomfort on both sides of her neck that radiate from her chest up toward her ears. Her stress test was negative for ischemia. EKG looks unchanged. I cannot figure out why she may have been considered for a pacemaker. Weight is been fairly stable if not up a few pounds. She does get swelling in her ankles. She is on Lasix for this. I think should also benefit from compression stockings as she likely has some venous insufficiency, given her weight.  Overall she seems to be doing well. We'll plan to see her back in 6 months.  Pixie Casino, MD, [facility] Attending Cardiologist [place] Capurro 05/17/2015, 6:34 PM

## 2015-05-17 NOTE — Telephone Encounter (Signed)
Pt notified of myoview results by phone with verbal understanding 

## 2015-07-12 ENCOUNTER — Ambulatory Visit: Payer: Self-pay | Admitting: Allergy and Immunology

## 2015-08-02 ENCOUNTER — Ambulatory Visit (INDEPENDENT_AMBULATORY_CARE_PROVIDER_SITE_OTHER): Payer: Medicare Other | Admitting: Allergy and Immunology

## 2015-08-02 ENCOUNTER — Encounter: Payer: Self-pay | Admitting: Allergy and Immunology

## 2015-08-02 VITALS — BP 138/86 | HR 68 | Temp 98.1°F | Resp 18 | Ht 62.8 in | Wt 233.0 lb

## 2015-08-02 DIAGNOSIS — J4540 Moderate persistent asthma, uncomplicated: Secondary | ICD-10-CM | POA: Diagnosis not present

## 2015-08-02 DIAGNOSIS — K219 Gastro-esophageal reflux disease without esophagitis: Secondary | ICD-10-CM

## 2015-08-02 DIAGNOSIS — H1010 Acute atopic conjunctivitis, unspecified eye: Secondary | ICD-10-CM

## 2015-08-02 DIAGNOSIS — T63441A Toxic effect of venom of bees, accidental (unintentional), initial encounter: Secondary | ICD-10-CM | POA: Diagnosis not present

## 2015-08-02 DIAGNOSIS — J387 Other diseases of larynx: Secondary | ICD-10-CM

## 2015-08-02 DIAGNOSIS — IMO0001 Reserved for inherently not codable concepts without codable children: Secondary | ICD-10-CM

## 2015-08-02 DIAGNOSIS — J309 Allergic rhinitis, unspecified: Secondary | ICD-10-CM | POA: Diagnosis not present

## 2015-08-02 MED ORDER — FLUTICASONE FUROATE 200 MCG/ACT IN AEPB: 1.0000 | INHALATION_SPRAY | Freq: Every day | RESPIRATORY_TRACT | Status: DC

## 2015-08-02 MED ORDER — RANITIDINE HCL 300 MG PO TABS: 300.0000 mg | ORAL_TABLET | Freq: Every day | ORAL | Status: DC

## 2015-08-02 NOTE — Progress Notes (Signed)
Dear Dr. Luvenia Castillo,  Thank you for referring Brooke Castillo to the [facility] on 08/02/2015.   Below is a summation of this patient's evaluation and recommendations.  Thank you for your referral. I will keep you informed about this patient's response to treatment.   If you have any questions please to do hestitate to contact me.   Sincerely,  Brooke Prows, MD [place]   ______________________________________________________________________    NEW PATIENT NOTE  Referring Provider: Annetta Maw, MD Primary Provider: Annetta Maw, MD Date of office visit: 08/02/2015    Subjective:   Chief Complaint:  Brooke Castillo (DOB: [birth year]) is a 70 y.o. female with a chief complaint of Asthma; Allergies; Eye Problem; and bee sting reaction  who presents to the clinic on 08/02/2015 with the following problems:  HPI: Brooke Castillo presents to this clinic in evaluation of allergies and asthma. She has a long history of both of these conditions back to 2002.   Her asthma is manifested as wheezing and coughing. She thinks that she's under relatively good control while using Advair and Singulair. Her requirement for short acting bronchodilator is approximately 1 time per month. She has not required emergency room evaluation or hospitalization or the administration of a systemic steroid in years. She does not exercise to any large degree for multiple reasons. She does get short of breath when she does exert herself any large degree.  Her upper airway disease is manifested as sneezing and nasal congestion and has increased in intensity over the course of the past several years. She does not use a nasal steroid secondary to epistaxis. Provoking factors for her symptoms include exposure to the outdoors and exposure to dust. She does not have a history of recurrent ugly nasal discharge or anosmia or  headache.  [place]ell also has gastroesophageal reflux disease presently treated with Protonix and elevation of the head of her bed. She thinks that for the most part her classic reflux symptoms are under very good control. However, it should be noted that she has a tremendous amount of throat clearing and mucus stuck in her throat and throat clearing type of cough that is a persistent issue. She drinks 2 cups of coffee per day and has tea once a day and has soda about twice a week. She does not consume any chocolate or drink alcohol.  Brooke Castillo states that she was stung on her face about 9 years ago by yellow jackets. She had over 4 stings on her face. She developed a large local reaction and found it hard to breathe with some throat closing. She went to the urgent care center was given an IV.  Past Medical History  Diagnosis Date  . History of renal failure 2004    accidently took too much aleve  . Diverticulitis     s/p partial colectomy  . Asthma   . Arthritis   . Hypertension   . Complication of anesthesia     IN 2011 I HAD COLON SURGERY & IT TOOK ME 2 DAYS TO WAKE UP   . GERD (gastroesophageal reflux disease)   . Rheumatic fever     as a child  . HLD (hyperlipidemia)   . DM2 (diabetes mellitus, type 2) (HCC)     A1c in 12/16 was 6.6  . LBBB (left bundle branch block)     transient  . History of nuclear stress test  a. Myoview 5/15: no ischemia, EF 67%  //  b. Myoview 3/17: EF 63%, septal defect c/w artifact, no ischemia; Low Risk  . History of echocardiogram     a. Echo 2/17 ([place]):  EF 55-60%    Past Surgical History  Procedure Laterality Date  . Abdominal hysterectomy  1980    complete  . Appendectomy  1980  . Cholecystectomy  1989  . Knee surgery Bilateral 2009  . Colon surgery  2011    removed 11 inches of colon  . Tonsillectomy    . Sinoscopy        Medication List           acetaminophen 500 MG tablet  Commonly known as:  TYLENOL  Take 500 mg by  mouth every 8 (eight) hours as needed for moderate pain.     albuterol 108 (90 Base) MCG/ACT inhaler  Commonly known as:  PROVENTIL HFA;VENTOLIN HFA  Inhale 1-2 puffs into the lungs every 4 (four) hours as needed for wheezing or shortness of breath.     amoxicillin 875 MG tablet  Commonly known as:  AMOXIL  Take 3,500 mg by mouth as directed. Prior to dental procedures     aspirin 325 MG tablet  Take 325 mg by mouth daily.     cholecalciferol 1000 units tablet  Commonly known as:  VITAMIN D  Take 1,000 Units by mouth daily.     clotrimazole-betamethasone cream  Commonly known as:  LOTRISONE  Apply 1 application topically 2 (two) times daily as needed (FOR YEAST INFECTION).     Co Q-10 100 MG Caps  Take 1 capsule by mouth every morning.     cyclobenzaprine 10 MG tablet  Commonly known as:  FLEXERIL  Take 10 mg by mouth 3 (three) times daily as needed for muscle spasms.     EPIPEN 0.3 mg/0.3 mL Soaj injection  Generic drug:  EPINEPHrine  Inject 0.3 mg into the muscle once as needed (FOR ANAPHYLAXIS).     estradiol 1 MG tablet  Commonly known as:  ESTRACE  Take 1 mg by mouth daily.     fenofibrate 160 MG tablet  Take 160 mg by mouth daily.     fexofenadine 180 MG tablet  Commonly known as:  ALLEGRA  Take 180 mg by mouth daily.     Fluticasone-Salmeterol 250-50 MCG/DOSE Aepb  Commonly known as:  ADVAIR  Inhale 1 puff into the lungs 2 (two) times daily.     furosemide 40 MG tablet  Commonly known as:  LASIX  Take 40 mg by mouth daily.     HYDROcodone-acetaminophen 5-325 MG tablet  Commonly known as:  NORCO/VICODIN  Take 1-2 tablets by mouth every 6 (six) hours as needed for moderate pain.     lisinopril 20 MG tablet  Commonly known as:  PRINIVIL,ZESTRIL  Take 20 mg by mouth daily.     LORazepam 1 MG tablet  Commonly known as:  ATIVAN  Take 1 mg by mouth 2 (two) times daily as needed for anxiety.     montelukast 10 MG tablet  Commonly known as:  SINGULAIR    Take 10 mg by mouth daily.     pantoprazole 40 MG tablet  Commonly known as:  PROTONIX  Take 40 mg by mouth daily.     rosuvastatin 5 MG tablet  Commonly known as:  CRESTOR  Take 1 tablet (5 mg total) by mouth daily.        Allergies  Allergen Reactions  . Ciprofloxacin Hives  . Codeine Other (See Comments)    "knocks me out"   . Diflucan [Fluconazole] Hives  . Niaspan [Niacin Er] Other (See Comments)  . Statins Other (See Comments)    Joint pain  . Flagyl [Metronidazole] Palpitations    Review of systems negative except as noted in HPI / PMHx or noted below:  Review of Systems  Constitutional: Negative.   HENT: Negative.   Eyes: Negative.   Respiratory: Negative.   Cardiovascular: Negative.   Gastrointestinal: Negative.   Genitourinary: Negative.   Musculoskeletal: Negative.   Skin: Negative.   Neurological: Negative.   Endo/Heme/Allergies: Negative.   Psychiatric/Behavioral: Negative.     Family History  Problem Relation Age of Onset  . Coronary artery disease Father 8    CABGx5; brain stem stroke  . Heart failure Mother   . COPD Mother   . CAD Brother 31    stent  . Hypertension Sister   . Aneurysm Paternal Grandmother   . Heart disease Paternal Grandfather     Social History   Social History  . Marital Status: Married    Spouse Name: N/A  . Number of Children: N/A  . Years of Education: N/A   Occupational History  . Not on file.   Social History Main Topics  . Smoking status: Never Smoker   . Smokeless tobacco: Never Used  . Alcohol Use: No  . Drug Use: No  . Sexual Activity: Not on file   Other Topics Concern  . Not on file   Social History Narrative    Environmental and Social history  Lives in a house with a dry environment, no animals located inside the household, no carpeting in the bedroom, no plastic on the bed or pillow, and no smokers located inside the household. She works in an Human resources officer N/A office  environment   Objective:   Filed Vitals:   08/02/15 0929  BP: 138/86  Pulse: 68  Temp: 98.1 F (36.7 C)  Resp: 18   Height: 5' 2.8" (159.5 cm) Weight: 233 lb (105.688 kg)  Physical Exam  Constitutional: She is well-developed, well-nourished, and in no distress.  Raspy voice, throat clearing  HENT:  Head: Normocephalic. Head is without right periorbital erythema and without left periorbital erythema.  Right Ear: Tympanic membrane, external ear and ear canal normal.  Left Ear: Tympanic membrane, external ear and ear canal normal.  Nose: Nose normal. No mucosal edema or rhinorrhea.  Mouth/Throat: Oropharynx is clear and moist and mucous membranes are normal. No oropharyngeal exudate.  Eyes: Conjunctivae and lids are normal. Pupils are equal, round, and reactive to light.  Neck: Trachea normal. No tracheal deviation present. No thyromegaly present.  Cardiovascular: Normal rate, regular rhythm, S1 normal, S2 normal and normal heart sounds.   No murmur heard. Pulmonary/Chest: Effort normal. No stridor. No tachypnea. No respiratory distress. She has no wheezes. She has no rales. She exhibits no tenderness.  Abdominal: Soft. She exhibits no distension and no mass. There is no hepatosplenomegaly. There is no tenderness. There is no rebound and no guarding.  Musculoskeletal: She exhibits edema (Trace lower extremity edema). She exhibits no tenderness.  Lymphadenopathy:       Head (right side): No tonsillar adenopathy present.       Head (left side): No tonsillar adenopathy present.    She has no cervical adenopathy.    She has no axillary adenopathy.  Neurological: She is alert. Gait normal.  Skin: No  rash noted. She is not diaphoretic. No erythema. No pallor. Nails show no clubbing.  Psychiatric: Mood and affect normal.     Diagnostics: Allergy skin tests were performed. She did not demonstrate any hypersensitivity to a screening panel of foods or aeroallergens  Spirometry was  performed and demonstrated an FEV1 of 1.91 @ 85 % of predicted. Following the administration of nebulized albuterol her FEV1 rose to 2.11 which was 93% of predicted and calculated out to an increase in the FEV1 of 10%  The patient had an Asthma Control Test with the following results: ACT Total Score: 23.     Assessment and Plan:    1. Asthma, moderate persistent, well-controlled   2. Allergic rhinoconjunctivitis   3. LPRD (laryngopharyngeal reflux disease)   4. Hymenoptera reaction, accidental or unintentional, initial encounter     1. Allergen avoidance measures?  2. Treat and prevent inflammation:   A. exchange Advair for Arnuity 200 one inhalation 1 time per day  B. continue montelukast 10 mg daily  C. start OTC Rhinocort one spray each nostril approximately 3 times a week  3. Treat and prevent reflux   A. Taper off all caffeine and chocolate slowly  B. continue Protonix 40 mg in a.m.  C. start ranitidine 300 mg in PM  4. If needed:   A. Proventil HFA 2 puffs every 4-6 hours  B. OTC antihistamine  5. Start progressive exercise routine  6. "Action plan" for asthma flare up   A. Increase Arnuity 200 to 2 inhalations twice a day  B. use Proventil HFA if needed  7. Blood - hymenoptera venom panel  8. Return to clinic in 4 weeks or earlier if problem  Dub Mikes appears to have a inflamed and irritated respiratory tract and I suspect that a component of this abnormality is secondary to her ongoing LPR for which we will have her taper off all caffeine and chocolate consumption and add in ranitidine to her Protonix. As well, I am going to try to eliminate her long-acting bronchodilator and have her use solely a inhaled steroid as noted above. I suspect that coming off her long-acting bronchodilator will probably allow her to get better control of her systemic arterial hypertension. She does have dyspnea on exertion and I suspect that this is secondary to cardiovascular  deconditioning and her obesity and I've had a talk with her today about these issues and the need to start a progressive exercise routine. Finally, she does have a history that may or may not be consistent with true Hymenoptera allergy and will evaluate this issue with a Hymenoptera venom panel. I'll see her back in this clinic in approximately 4 weeks or earlier if there is a problem.  Brooke Prows, MD [facility]

## 2015-08-02 NOTE — Patient Instructions (Addendum)
  1. Allergen avoidance measures  2. Treat and prevent inflammation:   A. exchange Advair for Arnuity 200 one inhalation 1 time per day  B. continue montelukast 10 mg daily  C. start OTC Rhinocort one spray each nostril approximately 3 times a week  3. Treat and prevent reflux   A. Taper off all caffeine and chocolate slowly  B. continue Protonix 40 mg in a.m.  C. start ranitidine 300 mg in PM  4. If needed:   A. Proventil HFA 2 puffs every 4-6 hours  B. OTC antihistamine  5. Start progressive exercise routine  6. "Action plan" for asthma flare up   A. Increase Arnuity 200 to 2 inhalations twice a day  B. use Proventil HFA if needed  7. Blood - hymenoptera venom panel  8. Return to clinic in 4 weeks or earlier if problem

## 2015-08-07 LAB — ALLERGEN STINGING INSECT PANEL
Honeybee IgE: 0.25 kU/L — AB
Hornet, White Face, IgE: 0.22 kU/L — AB
Paper Wasp IgE: 0.35 kU/L — AB
Yellow Jacket, IgE: 0.37 kU/L — AB

## 2015-08-31 ENCOUNTER — Encounter: Payer: Self-pay | Admitting: Allergy and Immunology

## 2015-08-31 ENCOUNTER — Ambulatory Visit (INDEPENDENT_AMBULATORY_CARE_PROVIDER_SITE_OTHER): Payer: Medicare Other | Admitting: Allergy and Immunology

## 2015-08-31 VITALS — BP 116/82 | HR 60 | Resp 16

## 2015-08-31 DIAGNOSIS — T63441A Toxic effect of venom of bees, accidental (unintentional), initial encounter: Secondary | ICD-10-CM

## 2015-08-31 DIAGNOSIS — H1010 Acute atopic conjunctivitis, unspecified eye: Secondary | ICD-10-CM

## 2015-08-31 DIAGNOSIS — J387 Other diseases of larynx: Secondary | ICD-10-CM | POA: Diagnosis not present

## 2015-08-31 DIAGNOSIS — J4540 Moderate persistent asthma, uncomplicated: Secondary | ICD-10-CM | POA: Diagnosis not present

## 2015-08-31 DIAGNOSIS — IMO0001 Reserved for inherently not codable concepts without codable children: Secondary | ICD-10-CM

## 2015-08-31 DIAGNOSIS — T63441D Toxic effect of venom of bees, accidental (unintentional), subsequent encounter: Secondary | ICD-10-CM

## 2015-08-31 DIAGNOSIS — J309 Allergic rhinitis, unspecified: Secondary | ICD-10-CM | POA: Diagnosis not present

## 2015-08-31 DIAGNOSIS — K219 Gastro-esophageal reflux disease without esophagitis: Secondary | ICD-10-CM

## 2015-08-31 NOTE — Patient Instructions (Addendum)
  1. EpiPen, Benadryl, M.D./ER for allergic reaction  2. Continue to Treat and prevent inflammation:   A. continue Arnuity 200 one inhalation 1 time per day  B. continue montelukast 10 mg daily  C. continue OTC Rhinocort one spray each nostril approximately 3 times a week  3. Continue to Treat and prevent reflux   A. Taper off all caffeine and chocolate slowly  B. continue Protonix 40 mg in a.m.  C. continue ranitidine 300 mg in PM  4. If needed:   A. Proventil HFA 2 puffs every 4-6 hours  B. OTC antihistamine  5. Start progressive exercise routine  6. "Action plan" for asthma flare up   A. Increase Arnuity 200 to 2 inhalations twice a day  B. use Proventil HFA if needed  7. Return to clinic in 8 weeks or earlier if problem

## 2015-08-31 NOTE — Progress Notes (Signed)
Follow-up Note  Referring Provider: Annetta Maw, MD Primary Provider: Annetta Maw, MD Date of Office Visit: 08/31/2015  Subjective:   Brooke Castillo (DOB: 1945-04-24) is a 70 y.o. female who returns to the [facility] on 08/31/2015 in re-evaluation of the following:  HPI: Mandip returns to this clinic in reevaluation of her asthma, allergic rhinitis, LPR and Hymenoptera allergy. I last saw her in his clinic on 08/03/2015 at which time we establish a plan to address all these issues.  During the interval she has resolved almost all of her respiratory tract symptoms. She no longer has any cough and her throat is much better without any throat clearing or the sensation of drainage. She still gets a little bit hoarse towards the tail end of the day but this has improved as well. She is sleeping much better at this point in time. She does not need to use a short acting bronchodilator. She's had no issues with her nose.  Concerning treatment for her reflux and LPR, she is raised the head of the bed and she has changed her behavior to completely eliminate all caffeine and chocolate consumption.    Medication List                    acetaminophen 500 MG tablet  Commonly known as:  TYLENOL  Take 500 mg by mouth every 8 (eight) hours as needed for moderate pain.     albuterol 108 (90 Base) MCG/ACT inhaler  Commonly known as:  PROVENTIL HFA;VENTOLIN HFA  Inhale 1-2 puffs into the lungs every 4 (four) hours as needed for wheezing or shortness of breath.     aspirin 325 MG tablet  Take 325 mg by mouth daily.     cetirizine 10 MG tablet  Commonly known as:  ZYRTEC  Take 10 mg by mouth every other day.     cholecalciferol 1000 units tablet  Commonly known as:  VITAMIN D  Take 1,000 Units by mouth daily.     clotrimazole-betamethasone cream  Commonly known as:  LOTRISONE  Apply 1 application topically 2 (two) times daily as needed (FOR YEAST INFECTION).       Co Q-10 100 MG Caps  Take 1 capsule by mouth every morning.     cyclobenzaprine 10 MG tablet  Commonly known as:  FLEXERIL  Take 10 mg by mouth 3 (three) times daily as needed for muscle spasms.     EPIPEN 0.3 mg/0.3 mL Soaj injection  Generic drug:  EPINEPHrine  Inject 0.3 mg into the muscle once as needed (FOR ANAPHYLAXIS).     estradiol 1 MG tablet  Commonly known as:  ESTRACE  Take 1 mg by mouth daily.     fenofibrate 160 MG tablet  Take 160 mg by mouth daily.     fexofenadine 180 MG tablet  Commonly known as:  ALLEGRA  Take 180 mg by mouth every other day.     Fluticasone Furoate 200 MCG/ACT Aepb  Commonly known as:  ARNUITY ELLIPTA  Inhale 1 puff into the lungs daily.     furosemide 40 MG tablet  Commonly known as:  LASIX  Take 40 mg by mouth daily.     HYDROcodone-acetaminophen 5-325 MG tablet  Commonly known as:  NORCO/VICODIN  Take 1-2 tablets by mouth every 6 (six) hours as needed for moderate pain.     lisinopril 20 MG tablet  Commonly known as:  PRINIVIL,ZESTRIL  Take 20 mg by  mouth daily.     LORazepam 1 MG tablet  Commonly known as:  ATIVAN  Take 1 mg by mouth 2 (two) times daily as needed for anxiety.     montelukast 10 MG tablet  Commonly known as:  SINGULAIR  Take 10 mg by mouth daily.     pantoprazole 40 MG tablet  Commonly known as:  PROTONIX  Take 40 mg by mouth daily.     ranitidine 300 MG tablet  Commonly known as:  ZANTAC  Take 1 tablet (300 mg total) by mouth at bedtime.     RHINOCORT ALLERGY 32 MCG/ACT nasal spray  Generic drug:  budesonide  Place 1 spray into both nostrils daily.     rosuvastatin 5 MG tablet  Commonly known as:  CRESTOR  Take 1 tablet (5 mg total) by mouth daily.        Past Medical History  Diagnosis Date  . History of renal failure 2004    accidently took too much aleve  . Diverticulitis     s/p partial colectomy  . Asthma   . Arthritis   . Hypertension   . Complication of anesthesia     IN  2011 I HAD COLON SURGERY & IT TOOK ME 2 DAYS TO WAKE UP   . GERD (gastroesophageal reflux disease)   . Rheumatic fever     as a child  . HLD (hyperlipidemia)   . DM2 (diabetes mellitus, type 2) (HCC)     A1c in 12/16 was 6.6  . LBBB (left bundle branch block)     transient  . History of nuclear stress test     a. Myoview 5/15: no ischemia, EF 67%  //  b. Myoview 3/17: EF 63%, septal defect c/w artifact, no ischemia; Low Risk  . History of echocardiogram     a. Echo 2/17 ([place]):  EF 55-60%    Past Surgical History  Procedure Laterality Date  . Abdominal hysterectomy  1980    complete  . Appendectomy  1980  . Cholecystectomy  1989  . Knee surgery Bilateral 2009  . Colon surgery  2011    removed 11 inches of colon  . Tonsillectomy    . Sinoscopy      Allergies  Allergen Reactions  . Ciprofloxacin Hives  . Codeine Other (See Comments)    "knocks me out"   . Diflucan [Fluconazole] Hives  . Niaspan [Niacin Er] Other (See Comments)  . Statins Other (See Comments)    Joint pain  . Flagyl [Metronidazole] Palpitations    Review of systems negative except as noted in HPI / PMHx or noted below:  Review of Systems  Constitutional: Negative.   HENT: Negative.   Eyes: Negative.   Respiratory: Negative.   Cardiovascular: Negative.   Gastrointestinal: Negative.   Genitourinary: Negative.   Musculoskeletal: Negative.   Skin: Negative.   Neurological: Negative.   Endo/Heme/Allergies: Negative.   Psychiatric/Behavioral: Negative.      Objective:   Filed Vitals:   08/31/15 1011  BP: 116/82  Pulse: 60  Resp: 16          Physical Exam  Constitutional: She is well-developed, well-nourished, and in no distress.  HENT:  Head: Normocephalic.  Right Ear: Tympanic membrane, external ear and ear canal normal.  Left Ear: Tympanic membrane, external ear and ear canal normal.  Nose: Nose normal. No mucosal edema or rhinorrhea.  Mouth/Throat: Uvula is midline,  oropharynx is clear and moist and mucous membranes are  normal. No oropharyngeal exudate.  Eyes: Conjunctivae are normal.  Neck: Trachea normal. No tracheal tenderness present. No tracheal deviation present. No thyromegaly present.  Cardiovascular: Normal rate, regular rhythm, S1 normal, S2 normal and normal heart sounds.   No murmur heard. Pulmonary/Chest: Breath sounds normal. No stridor. No respiratory distress. She has no wheezes. She has no rales.  Musculoskeletal: She exhibits no edema.  Lymphadenopathy:       Head (right side): No tonsillar adenopathy present.       Head (left side): No tonsillar adenopathy present.    She has no cervical adenopathy.  Neurological: She is alert. Gait normal.  Skin: No rash noted. She is not diaphoretic. No erythema. Nails show no clubbing.  Psychiatric: Mood and affect normal.    Diagnostics: Results of blood tests obtained on 08/02/2015 identified very low titers of antibodies against all venoms noted in the Hymenoptera panel.   Spirometry was performed and demonstrated an FEV1 of 2.10 at 93 % of predicted.  The patient had an Asthma Control Test with the following results: ACT Total Score: 24.    Assessment and Plan:   1. Asthma, moderate persistent, well-controlled   2. Allergic rhinoconjunctivitis   3. LPRD (laryngopharyngeal reflux disease)   4. Hymenoptera reaction, accidental or unintentional, initial encounter     1. EpiPen, Benadryl, M.D./ER for allergic reaction  2. Continue to Treat and prevent inflammation:   A. continue Arnuity 200 one inhalation 1 time per day  B. continue montelukast 10 mg daily  C. continue OTC Rhinocort one spray each nostril approximately 3 times a week  3. Continue to Treat and prevent reflux   A. Taper off all caffeine and chocolate slowly  B. continue Protonix 40 mg in a.m.  C. continue ranitidine 300 mg in PM  4. If needed:   A. Proventil HFA 2 puffs every 4-6 hours  B. OTC  antihistamine  5. Start progressive exercise routine  6. "Action plan" for asthma flare up   A. Increase Arnuity 200 to 2 inhalations twice a day  B. use Proventil HFA if needed  7. Return to clinic in 8 weeks or earlier if problem  [place] is much better on her current plan. I suspect that reflux was one of the major issues irritating her airway and now that she has that under control I think we'll be able to slowly taper down a lot of her medications. However, I would like to have her remain on her current plan for a full 12 weeks of therapy thus I will see her back in this clinic in 8 weeks. Concerning her Hymenoptera venom allergy, she elected to just use an EpiPen and perform avoidance measures and not go through venom clinic and consideration for a course of immunotherapy.   Allena Katz, MD [place]

## 2016-01-26 ENCOUNTER — Other Ambulatory Visit: Payer: Self-pay | Admitting: Allergy and Immunology

## 2016-01-31 ENCOUNTER — Encounter: Payer: Self-pay | Admitting: Allergy and Immunology

## 2016-01-31 ENCOUNTER — Ambulatory Visit (INDEPENDENT_AMBULATORY_CARE_PROVIDER_SITE_OTHER): Payer: Medicare Other | Admitting: Allergy and Immunology

## 2016-01-31 VITALS — BP 124/76 | HR 68 | Resp 16

## 2016-01-31 DIAGNOSIS — J3089 Other allergic rhinitis: Secondary | ICD-10-CM | POA: Diagnosis not present

## 2016-01-31 DIAGNOSIS — T63441A Toxic effect of venom of bees, accidental (unintentional), initial encounter: Secondary | ICD-10-CM

## 2016-01-31 DIAGNOSIS — J4540 Moderate persistent asthma, uncomplicated: Secondary | ICD-10-CM

## 2016-01-31 DIAGNOSIS — K219 Gastro-esophageal reflux disease without esophagitis: Secondary | ICD-10-CM | POA: Diagnosis not present

## 2016-01-31 DIAGNOSIS — IMO0001 Reserved for inherently not codable concepts without codable children: Secondary | ICD-10-CM

## 2016-01-31 MED ORDER — EPINEPHRINE 0.3 MG/0.3ML IJ SOAJ: INTRAMUSCULAR | 3 refills | Status: DC

## 2016-01-31 NOTE — Progress Notes (Signed)
Follow-up Note  Referring Provider: Annetta Maw, MD Primary Provider: Annetta Maw, MD Date of Office Visit: 01/31/2016  Subjective:   Brooke Castillo (DOB: 1945-07-31) is a 70 y.o. female who returns to the [facility] on 01/31/2016 in re-evaluation of the following:  HPI: Keliah returns to this clinic in reevaluation of her asthma and allergic rhinitis and LPR and Hymenoptera allergy. I last saw her in his clinic in June 2017.  During the interval she has had excellent control of her asthma. She has not required systemic steroids for an exacerbation and rarely uses any short acting bronchodilator. She can exert herself without any difficulty.  Her nose has been doing quite well. She has not required an antibiotic to treat an episode of sinusitis.  Her reflux has been under excellent control. She has raised the head of the bed. She's had no issues with her throat at all.  She did receive the flu vaccine for this year.  She continues to carry an EpiPen for her Hymenoptera allergy.    Medication List      acetaminophen 500 MG tablet Commonly known as:  TYLENOL Take 500 mg by mouth every 8 (eight) hours as needed for moderate pain.   albuterol 108 (90 Base) MCG/ACT inhaler Commonly known as:  PROVENTIL HFA;VENTOLIN HFA Inhale 1-2 puffs into the lungs every 4 (four) hours as needed for wheezing or shortness of breath.   aspirin 325 MG tablet Take 325 mg by mouth daily.   cetirizine 10 MG tablet Commonly known as:  ZYRTEC Take 10 mg by mouth every other day.   cholecalciferol 1000 units tablet Commonly known as:  VITAMIN D Take 1,000 Units by mouth daily.   clotrimazole-betamethasone cream Commonly known as:  LOTRISONE Apply 1 application topically 2 (two) times daily as needed (FOR YEAST INFECTION).   Co Q-10 100 MG Caps Take 1 capsule by mouth every morning.   cyclobenzaprine 10 MG tablet Commonly known as:  FLEXERIL Take 10 mg by  mouth 3 (three) times daily as needed for muscle spasms.   EPIPEN 0.3 mg/0.3 mL Soaj injection Generic drug:  EPINEPHrine Inject 0.3 mg into the muscle once as needed (FOR ANAPHYLAXIS).   fenofibrate 160 MG tablet Take 160 mg by mouth daily.   fexofenadine 180 MG tablet Commonly known as:  ALLEGRA Take 180 mg by mouth every other day.   Fluticasone Furoate 200 MCG/ACT Aepb Commonly known as:  ARNUITY ELLIPTA Inhale 1 puff into the lungs daily.   furosemide 40 MG tablet Commonly known as:  LASIX Take 40 mg by mouth daily.   HYDROcodone-acetaminophen 5-325 MG tablet Commonly known as:  NORCO/VICODIN Take 1-2 tablets by mouth every 6 (six) hours as needed for moderate pain.   lisinopril 20 MG tablet Commonly known as:  PRINIVIL,ZESTRIL Take 20 mg by mouth daily.   LORazepam 1 MG tablet Commonly known as:  ATIVAN Take 1 mg by mouth 2 (two) times daily as needed for anxiety.   meclizine 12.5 MG tablet Commonly known as:  ANTIVERT Take 12.5 mg by mouth.   montelukast 10 MG tablet Commonly known as:  SINGULAIR Take 10 mg by mouth daily.   nystatin powder Commonly known as:  MYCOSTATIN/NYSTOP Apply 2 times a day as needed   pantoprazole 40 MG tablet Commonly known as:  PROTONIX Take 40 mg by mouth daily.   ranitidine 300 MG tablet Commonly known as:  ZANTAC Take 1 tablet (300 mg total) by mouth  at bedtime.   RHINOCORT ALLERGY 32 MCG/ACT nasal spray Generic drug:  budesonide Place 1 spray into both nostrils daily.   rosuvastatin 5 MG tablet Commonly known as:  CRESTOR Take 1 tablet (5 mg total) by mouth daily.       Past Medical History:  Diagnosis Date  . Arthritis   . Asthma   . Complication of anesthesia    IN 2011 I HAD COLON SURGERY & IT TOOK ME 2 DAYS TO WAKE UP   . Diverticulitis    s/p partial colectomy  . DM2 (diabetes mellitus, type 2) (HCC)    A1c in 12/16 was 6.6  . GERD (gastroesophageal reflux disease)   . History of echocardiogram     a. Echo 2/17 ([place]):  EF 55-60%  . History of nuclear stress test    a. Myoview 5/15: no ischemia, EF 67%  //  b. Myoview 3/17: EF 63%, septal defect c/w artifact, no ischemia; Low Risk  . History of renal failure 2004   accidently took too much aleve  . HLD (hyperlipidemia)   . Hymenoptera allergy   . Hypertension   . LBBB (left bundle branch block)    transient  . Rheumatic fever    as a child    Past Surgical History:  Procedure Laterality Date  . ABDOMINAL HYSTERECTOMY  1980   complete  . APPENDECTOMY  1980  . CHOLECYSTECTOMY  1989  . COLON SURGERY  2011   removed 11 inches of colon  . KNEE SURGERY Bilateral 2009  . SINOSCOPY    . TONSILLECTOMY      Allergies  Allergen Reactions  . Ciprofloxacin Hives  . Codeine Other (See Comments)    "knocks me out"   . Diflucan [Fluconazole] Hives  . Niaspan [Niacin Er] Other (See Comments)  . Statins Other (See Comments)    Joint pain  . Flagyl [Metronidazole] Palpitations    Review of systems negative except as noted in HPI / PMHx or noted below:  Review of Systems  Constitutional: Negative.   HENT: Negative.   Eyes: Negative.   Respiratory: Negative.   Cardiovascular: Negative.   Gastrointestinal: Negative.   Genitourinary: Negative.   Musculoskeletal: Negative.   Skin: Negative.   Neurological: Negative.   Endo/Heme/Allergies: Negative.   Psychiatric/Behavioral: Negative.      Objective:   Vitals:   01/31/16 1048  BP: 124/76  Pulse: 68  Resp: 16          Physical Exam  Constitutional: She is well-developed, well-nourished, and in no distress.  HENT:  Head: Normocephalic.  Right Ear: Tympanic membrane, external ear and ear canal normal.  Left Ear: Tympanic membrane, external ear and ear canal normal.  Nose: Nose normal. No mucosal edema or rhinorrhea.  Mouth/Throat: Uvula is midline, oropharynx is clear and moist and mucous membranes are normal. No oropharyngeal exudate.  Eyes:  Conjunctivae are normal.  Neck: Trachea normal. No tracheal tenderness present. No tracheal deviation present. No thyromegaly present.  Cardiovascular: Normal rate, regular rhythm, S1 normal, S2 normal and normal heart sounds.   No murmur heard. Pulmonary/Chest: Breath sounds normal. No stridor. No respiratory distress. She has no wheezes. She has no rales.  Musculoskeletal: She exhibits no edema.  Lymphadenopathy:       Head (right side): No tonsillar adenopathy present.       Head (left side): No tonsillar adenopathy present.    She has no cervical adenopathy.  Neurological: She is alert. Gait normal.  Skin: No rash noted. She is not diaphoretic. No erythema. Nails show no clubbing.  Psychiatric: Mood and affect normal.    Diagnostics:    Spirometry was performed and demonstrated an FEV1 of 2.02 at 91 % of predicted.  The patient had an Asthma Control Test with the following results: ACT Total Score: 23.    Assessment and Plan:   1. Asthma, moderate persistent, well-controlled   2. Other allergic rhinitis   3. LPRD (laryngopharyngeal reflux disease)   4. Hymenoptera reaction, accidental or unintentional, initial encounter     1. EpiPen, Benadryl, M.D./ER for allergic reaction  2. Continue to Treat and prevent inflammation:   A. Arnuity 200 one inhalation 1 time per day  B. discontinue montelukast    C. OTC Rhinocort one spray each nostril approximately 3 times a week  3. Continue to Treat and prevent reflux   A. Taper off all caffeine and chocolate slowly  B. continue Protonix 40 mg in a.m.  C. discontinue ranitidine in 2 weeks   4. If needed:   A. Proventil HFA 2 puffs every 4-6 hours  B. OTC antihistamine  5. "Action plan" for asthma flare up   A. Increase Arnuity 200 to 2 inhalations twice a day  B. use Proventil HFA if needed  6. Return to clinic in 6 months or earlier if problem  Samyukta appears to be doing very well on her current medical therapy and  we'll now see we can consolidate some of her treatment by decreasing the anti-inflammatory therapy for her respiratory tract with elimination of montelukast and decreasing her therapy for reflux by eliminating her H2 receptor blocker. Thus, she will remain on an inhaled steroid and a nasal steroid and a proton pump inhibitor and we'll see how she does over the course of the next 6 months. She will contact me should she have significant problems during this time frame and if she does well I will see her back in this clinic at that point.  Allena Katz, MD [place]

## 2016-01-31 NOTE — Patient Instructions (Addendum)
  1. EpiPen, Benadryl, M.D./ER for allergic reaction  2. Continue to Treat and prevent inflammation:   A. Arnuity 200 one inhalation 1 time per day  B. discontinue montelukast    C. OTC Rhinocort one spray each nostril approximately 3 times a week  3. Continue to Treat and prevent reflux   A. Taper off all caffeine and chocolate slowly  B. continue Protonix 40 mg in a.m.  C. discontinue ranitidine in 2 weeks   4. If needed:   A. Proventil HFA 2 puffs every 4-6 hours  B. OTC antihistamine  5. "Action plan" for asthma flare up   A. Increase Arnuity 200 to 2 inhalations twice a day  B. use Proventil HFA if needed  6. Return to clinic in 6 months or earlier if problem

## 2016-05-29 ENCOUNTER — Encounter (HOSPITAL_COMMUNITY): Payer: Self-pay

## 2016-05-29 ENCOUNTER — Emergency Department (HOSPITAL_COMMUNITY)
Admission: EM | Admit: 2016-05-29 | Discharge: 2016-05-30 | Disposition: A | Discharge status: Home / Self Care | Payer: Medicare Other | Attending: Emergency Medicine | Admitting: Emergency Medicine

## 2016-05-29 DIAGNOSIS — Z7982 Long term (current) use of aspirin: Secondary | ICD-10-CM | POA: Diagnosis not present

## 2016-05-29 DIAGNOSIS — Z79899 Other long term (current) drug therapy: Secondary | ICD-10-CM | POA: Diagnosis not present

## 2016-05-29 DIAGNOSIS — R072 Precordial pain: Secondary | ICD-10-CM

## 2016-05-29 DIAGNOSIS — R0789 Other chest pain: Secondary | ICD-10-CM | POA: Insufficient documentation

## 2016-05-29 DIAGNOSIS — I10 Essential (primary) hypertension: Secondary | ICD-10-CM | POA: Insufficient documentation

## 2016-05-29 DIAGNOSIS — J45909 Unspecified asthma, uncomplicated: Secondary | ICD-10-CM | POA: Diagnosis not present

## 2016-05-29 DIAGNOSIS — E119 Type 2 diabetes mellitus without complications: Secondary | ICD-10-CM | POA: Diagnosis not present

## 2016-05-29 LAB — CBC WITH DIFFERENTIAL/PLATELET
BASOS PCT: 0 %
Basophils Absolute: 0 10*3/uL (ref 0.0–0.1)
EOS PCT: 2 %
Eosinophils Absolute: 0.1 10*3/uL (ref 0.0–0.7)
HCT: 43.2 % (ref 36.0–46.0)
Hemoglobin: 15.2 g/dL — ABNORMAL HIGH (ref 12.0–15.0)
Lymphocytes Relative: 29 %
Lymphs Abs: 1.9 10*3/uL (ref 0.7–4.0)
MCH: 30.9 pg (ref 26.0–34.0)
MCHC: 35.2 g/dL (ref 30.0–36.0)
MCV: 87.8 fL (ref 78.0–100.0)
MONO ABS: 0.5 10*3/uL (ref 0.1–1.0)
Monocytes Relative: 8 %
Neutro Abs: 4.2 10*3/uL (ref 1.7–7.7)
Neutrophils Relative %: 61 %
PLATELETS: 230 10*3/uL (ref 150–400)
RBC: 4.92 MIL/uL (ref 3.87–5.11)
RDW: 12.6 % (ref 11.5–15.5)
WBC: 6.7 10*3/uL (ref 4.0–10.5)

## 2016-05-29 LAB — COMPREHENSIVE METABOLIC PANEL
ALT: 32 U/L (ref 14–54)
AST: 32 U/L (ref 15–41)
Albumin: 3.9 g/dL (ref 3.5–5.0)
Alkaline Phosphatase: 64 U/L (ref 38–126)
Anion gap: 12 (ref 5–15)
BILIRUBIN TOTAL: 0.6 mg/dL (ref 0.3–1.2)
BUN: 15 mg/dL (ref 6–20)
CHLORIDE: 98 mmol/L — AB (ref 101–111)
CO2: 26 mmol/L (ref 22–32)
CREATININE: 0.68 mg/dL (ref 0.44–1.00)
Calcium: 9.5 mg/dL (ref 8.9–10.3)
Glucose, Bld: 265 mg/dL — ABNORMAL HIGH (ref 65–99)
POTASSIUM: 3.7 mmol/L (ref 3.5–5.1)
Sodium: 136 mmol/L (ref 135–145)
TOTAL PROTEIN: 6.9 g/dL (ref 6.5–8.1)

## 2016-05-29 LAB — I-STAT TROPONIN, ED
TROPONIN I, POC: 0 ng/mL (ref 0.00–0.08)
Troponin i, poc: 0 ng/mL (ref 0.00–0.08)

## 2016-05-29 LAB — D-DIMER, QUANTITATIVE: D-Dimer, Quant: 0.43 ug/mL-FEU (ref 0.00–0.50)

## 2016-05-29 LAB — LIPASE, BLOOD: LIPASE: 25 U/L (ref 11–51)

## 2016-05-29 MED ORDER — ACETAMINOPHEN 325 MG PO TABS
650.0000 mg | ORAL_TABLET | Freq: Once | ORAL | Status: AC
  Administered 2016-05-29: 650 mg via ORAL
  Filled 2016-05-29: qty 2

## 2016-05-29 NOTE — ED Notes (Signed)
Pt ambulatory to RR in pt room with steady gait.

## 2016-05-29 NOTE — ED Notes (Signed)
Lab notified to collect repeat troponin 

## 2016-05-29 NOTE — ED Notes (Signed)
ED Provider at bedside. 

## 2016-05-29 NOTE — ED Provider Notes (Signed)
[facility] DEPT Provider Note   CSN: [id number] Arrival date & time: 05/29/16  1545     History   Chief Complaint Chief Complaint  Patient presents with  . Hypertension    HPI Brooke Castillo is a 71 y.o. female.  HPI  PMH GERD, HTN, HLD and asthma who presents with indigestion. Indigestion began Sunday and has progressively been getting worse. Last night she developed a pain under her left shoulder blade which is worse with deep breathing or laying on that side. The heartburn is associated with decreased appetite, burping, and bloating. She denies chest pain or pressure or difficulty breathing. She denies hx of GI ulcer.  She describes a great deal of stress recently related to her brother being diagnosed with metastatic prostate cancer. At this time she is not having indigestion but does have the left shoulder pain.    Past Medical History:  Diagnosis Date  . Arthritis   . Asthma   . Complication of anesthesia    IN 2011 I HAD COLON SURGERY & IT TOOK ME 2 DAYS TO WAKE UP   . Diverticulitis    s/p partial colectomy  . DM2 (diabetes mellitus, type 2) (HCC)    A1c in 12/16 was 6.6  . GERD (gastroesophageal reflux disease)   . History of echocardiogram    a. Echo 2/17 ([place]):  EF 55-60%  . History of nuclear stress test    a. Myoview 5/15: no ischemia, EF 67%  //  b. Myoview 3/17: EF 63%, septal defect c/w artifact, no ischemia; Low Risk  . History of renal failure 2004   accidently took too much aleve  . HLD (hyperlipidemia)   . Hymenoptera allergy   . Hypertension   . LBBB (left bundle branch block)    transient  . Rheumatic fever    as a child    Patient Active Problem List   Diagnosis Date Noted  . Chest pain 07/20/2013  . Dizziness 07/20/2013  . HTN (hypertension) 07/20/2013  . HLD (hyperlipidemia) 07/20/2013  . Obesity 07/20/2013    Past Surgical History:  Procedure Laterality Date  . ABDOMINAL HYSTERECTOMY  1980   complete  . APPENDECTOMY   1980  . CHOLECYSTECTOMY  1989  . COLON SURGERY  2011   removed 11 inches of colon  . KNEE SURGERY Bilateral 2009  . SINOSCOPY    . TONSILLECTOMY      OB History    No data available       Home Medications    Prior to Admission medications   Medication Sig Start Date End Date Taking? Authorizing Provider  acetaminophen (TYLENOL) 500 MG tablet Take 500 mg by mouth every 8 (eight) hours as needed for moderate pain.    Historical Provider, MD  albuterol (PROVENTIL HFA;VENTOLIN HFA) 108 (90 BASE) MCG/ACT inhaler Inhale 1-2 puffs into the lungs every 4 (four) hours as needed for wheezing or shortness of breath.    Historical Provider, MD  aspirin 325 MG tablet Take 325 mg by mouth daily.    Historical Provider, MD  budesonide (RHINOCORT ALLERGY) 32 MCG/ACT nasal spray Place 1 spray into both nostrils daily.    Historical Provider, MD  cetirizine (ZYRTEC) 10 MG tablet Take 10 mg by mouth every other day.    Historical Provider, MD  cholecalciferol (VITAMIN D) 1000 units tablet Take 1,000 Units by mouth daily.    Historical Provider, MD  clotrimazole-betamethasone (LOTRISONE) cream Apply 1 application topically 2 (two) times daily  as needed (FOR YEAST INFECTION).     Historical Provider, MD  Coenzyme Q10 (CO Q-10) 100 MG CAPS Take 1 capsule by mouth every morning.    Historical Provider, MD  cyclobenzaprine (FLEXERIL) 10 MG tablet Take 10 mg by mouth 3 (three) times daily as needed for muscle spasms.    Historical Provider, MD  EPINEPHrine (EPIPEN) 0.3 mg/0.3 mL IJ SOAJ injection Inject 0.3 mg into the muscle once as needed (FOR ANAPHYLAXIS).     Historical Provider, MD  EPINEPHrine 0.3 mg/0.3 mL IJ SOAJ injection Use as directed for life-threatening allergic reaction. 01/31/16   Jiles Prows, MD  fenofibrate 160 MG tablet Take 160 mg by mouth daily.    Historical Provider, MD  fexofenadine (ALLEGRA) 180 MG tablet Take by mouth.    Historical Provider, MD  Fluticasone Furoate (ARNUITY  ELLIPTA) 200 MCG/ACT AEPB Inhale 1 puff into the lungs daily. 08/02/15   Jiles Prows, MD  furosemide (LASIX) 40 MG tablet Take 40 mg by mouth daily.    Historical Provider, MD  HYDROcodone-acetaminophen (NORCO/VICODIN) 5-325 MG per tablet Take 1-2 tablets by mouth every 6 (six) hours as needed for moderate pain.    Historical Provider, MD  lisinopril (PRINIVIL,ZESTRIL) 20 MG tablet Take 20 mg by mouth daily. 01/31/14   Historical Provider, MD  LORazepam (ATIVAN) 1 MG tablet Take 1 mg by mouth 2 (two) times daily as needed for anxiety.    Historical Provider, MD  meclizine (ANTIVERT) 12.5 MG tablet Take 12.5 mg by mouth.    Historical Provider, MD  montelukast (SINGULAIR) 10 MG tablet Take 10 mg by mouth daily.    Historical Provider, MD  nystatin (MYCOSTATIN/NYSTOP) powder Apply 2 times a day as needed 01/03/16   Historical Provider, MD  pantoprazole (PROTONIX) 40 MG tablet Take 40 mg by mouth daily.    Historical Provider, MD  Probiotic Product (ALIGN PO) Take by mouth. Bifidobacterium Infantis    Historical Provider, MD  ranitidine (ZANTAC) 300 MG tablet Take 1 tablet (300 mg total) by mouth at bedtime. 08/02/15   Jiles Prows, MD  rosuvastatin (CRESTOR) 5 MG tablet Take 1 tablet (5 mg total) by mouth daily. 03/23/14   Pixie Casino, MD    Family History Family History  Problem Relation Age of Onset  . Heart failure Mother   . COPD Mother   . Coronary artery disease Father 30    CABGx5; brain stem stroke  . Aneurysm Paternal Grandmother   . Heart disease Paternal Grandfather   . CAD Brother 49    stent  . Hypertension Sister     Social History Social History  Substance Use Topics  . Smoking status: Never Smoker  . Smokeless tobacco: Never Used  . Alcohol use No     Allergies   Ciprofloxacin; Codeine; Diflucan [fluconazole]; Niaspan [niacin er]; Statins; and Flagyl [metronidazole]   Review of Systems Review of Systems  Constitutional: Positive for appetite change.  Negative for chills and fever.  Respiratory: Negative for cough, chest tightness and shortness of breath.   Cardiovascular: Negative for chest pain, palpitations and leg swelling.  Gastrointestinal: Positive for abdominal pain. Negative for blood in stool, constipation, diarrhea, nausea and vomiting.  Musculoskeletal: Negative for back pain.  All other systems reviewed and are negative.  Physical Exam Updated Vital Signs BP (!) 146/79   Pulse 64   Temp 97.6 F (36.4 C) (Oral)   Resp 12   Ht 5\' 3"  (1.6 m)   SpO2  95%   Physical Exam  Constitutional: She is oriented to person, place, and time. She appears well-developed and well-nourished. No distress.  HENT:  Head: Normocephalic and atraumatic.  Eyes: Conjunctivae are normal. No scleral icterus.  Cardiovascular: Normal rate and regular rhythm.   No murmur heard. No calf tenderness or swelling. No pain with foot dorsiflexion.    Pulmonary/Chest: Effort normal. No respiratory distress. She has no wheezes. She has no rales. She exhibits no tenderness.  Abdominal: Soft. She exhibits no distension and no mass. There is tenderness. There is no guarding.  Epigastric tenderness with palpation   Musculoskeletal:  Left shoulder pain is not reproducible   Neurological: She is alert and oriented to person, place, and time.  Skin: Skin is warm and dry. She is not diaphoretic.  Psychiatric: She has a normal mood and affect. Her behavior is normal.   ED Treatments / Results  Labs (all labs ordered are listed, but only abnormal results are displayed) Labs Reviewed  CBC WITH DIFFERENTIAL/PLATELET - Abnormal; Notable for the following:       Result Value   Hemoglobin 15.2 (*)    All other components within normal limits  COMPREHENSIVE METABOLIC PANEL - Abnormal; Notable for the following:    Chloride 98 (*)    Glucose, Bld 265 (*)    All other components within normal limits    EKG  EKG Interpretation  Date/Time:  Thursday May 29 2016 15:53:51 EDT Ventricular Rate:  66 PR Interval:  206 QRS Duration: 146 QT Interval:  420 QTC Calculation: 440 R Axis:     Text Interpretation:  Normal sinus rhythm Left bundle branch block Abnormal ECG No STEMI. Similar to prior.  Confirmed by LONG MD, JOSHUA [id number]) on 05/29/2016 3:58:30 PM       Radiology No results found.  Procedures Procedures (including critical care time)  Medications Ordered in ED Medications - No data to display   Initial Impression / Assessment and Plan / ED Course  I have reviewed the triage vital signs and the nursing notes.  Pertinent labs & imaging results that were available during my care of the patient were reviewed by me and considered in my medical decision making (see chart for details).   71 yo woman with PMH HTN, HLD, asthma, and GERD presents with heartburn and left shoulder pain. Pleuritic nature of her back pain is concerning for pulmonary embolism, Wells is low risk and cannot use PERC given her age. EKG showed left bundle branch block unchanged from prior 10/25/2014. HEART score 5 (LBBB, age, and risk factors). Initial troponin negative. Lipase 25, pancreatitis is less likely. Given a dose of tylenol   9 pm Reports improvement without complete resolution of left shoulder pain with tylenol. D-dimer today is WNL. Ordered follow up troponin.   12am Follow up troponin negative. I do not think that her back pain and abdominal pain are related to ACS. Will have her follow up with [facility] cardiology and her PCP. Discussed return precautions.   Final Clinical Impressions(s) / ED Diagnoses   Final diagnoses:  None    New Prescriptions New Prescriptions   No medications on file     Ledell Noss, MD 06/04/16 [place], MD 06/04/16 1106

## 2016-05-29 NOTE — Discharge Instructions (Addendum)

## 2016-05-29 NOTE — ED Notes (Addendum)
Pt reports she had a anxiety attack in the last week feeling short of breath and jittery. Pt also reports she has felt more dizzy when getting up. Pt also reports having more stress with a brother recently diagnosed with cancer.   Pt reports indigestion has been building, worsening last night. She states within an hour of eating she gets this feeling.

## 2016-05-29 NOTE — ED Triage Notes (Addendum)
Pt reports she has been feeling nauseous with headache and back ache, ongoing all week.  She states her BP has been elevated as well. Last night it was 172/94 and today at urgent care it was 148/90.  She reports back pain when taking deep breaths

## 2016-05-30 ENCOUNTER — Telehealth: Payer: Self-pay | Admitting: Allergy and Immunology

## 2016-05-30 MED ORDER — RANITIDINE HCL 300 MG PO TABS: 300.0000 mg | ORAL_TABLET | Freq: Every day | ORAL | 5 refills | Status: DC

## 2016-05-30 NOTE — Telephone Encounter (Signed)
Spoke with pt. Sent in refills for ranitidine.

## 2016-05-30 NOTE — Telephone Encounter (Signed)
Yes, restart Ranitidine

## 2016-05-30 NOTE — Telephone Encounter (Signed)
Patient states she went to E.R. Last night due to Acid reflux.  The ED Dr. Burnard Bunting she start taking Ranitidine.  Patient stated she stopped taking it back in November.  Patient wants to know if she can start taking it again because the acid reflux is bad.

## 2016-11-17 ENCOUNTER — Other Ambulatory Visit: Payer: Self-pay | Admitting: Allergy and Immunology

## 2017-08-11 IMAGING — NM NM MISC PROCEDURE
6 series · 36 of 36 positions shown · non-contrast
Comparison: none

[Series 1: wbr_s-proj_st stress · 6.51mm/px · 6 of 512 frames shown (1 of 2)]
[frame 43/512]
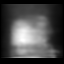
[frame 128/512]
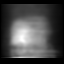
[frame 214/512]
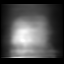
[frame 299/512]
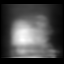
[frame 384/512]
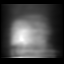
[frame 470/512]
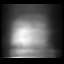

[Series 1: stress · 6.51mm/px · 6 of 64 frames shown (1 of 2)]
[frame 6/64]
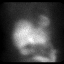
[frame 16/64]
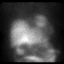
[frame 27/64]
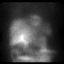
[frame 38/64]
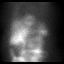
[frame 48/64]
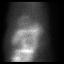
[frame 59/64]
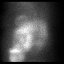

[Series 1: stress · 6.51mm/px · 6 of 485 frames shown (2 of 2)]
[frame 41/485  full-range]
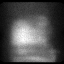
[frame 121/485  full-range]
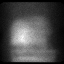
[frame 202/485  full-range]
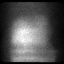
[frame 283/485  full-range]
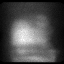
[frame 364/485  full-range]
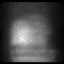
[frame 445/485  full-range]
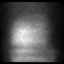

[Series 1: wbr_s-proj_st stress · 6.51mm/px · 6 of 64 frames shown (2 of 2)]
[frame 6/64]
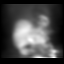
[frame 16/64]
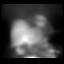
[frame 27/64]
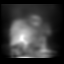
[frame 38/64]
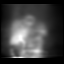
[frame 48/64]
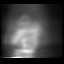
[frame 59/64]
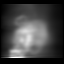

[Series 2: wbr_r-proj_st rest · 6.51mm/px · 6 of 64 frames shown]
[frame 6/64]
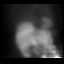
[frame 16/64]
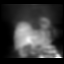
[frame 27/64]
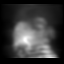
[frame 38/64]
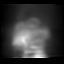
[frame 48/64]
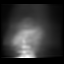
[frame 59/64]
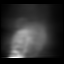

[Series 2: rest · 6.51mm/px · 6 of 64 frames shown]
[frame 6/64]
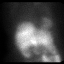
[frame 16/64]
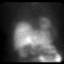
[frame 27/64]
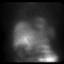
[frame 38/64]
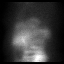
[frame 48/64]
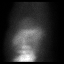
[frame 59/64]
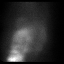

[36 of 36 positions shown; findings below may reference images not displayed]

Canned report from images found in remote index.

Refer to host system for actual result text.

## 2018-03-01 ENCOUNTER — Ambulatory Visit (INDEPENDENT_AMBULATORY_CARE_PROVIDER_SITE_OTHER): Payer: Medicare Other | Admitting: Internal Medicine

## 2018-03-01 ENCOUNTER — Encounter: Payer: Self-pay | Admitting: Internal Medicine

## 2018-03-01 VITALS — BP 159/76 | HR 63 | Ht 63.0 in | Wt 218.4 lb

## 2018-03-01 DIAGNOSIS — I10 Essential (primary) hypertension: Secondary | ICD-10-CM | POA: Diagnosis not present

## 2018-03-01 DIAGNOSIS — E785 Hyperlipidemia, unspecified: Secondary | ICD-10-CM

## 2018-03-01 LAB — LIPID PANEL
CHOL/HDL RATIO: 6 ratio — AB (ref 0.0–4.4)
Cholesterol, Total: 217 mg/dL — ABNORMAL HIGH (ref 100–199)
HDL: 36 mg/dL — AB (ref >39)
Triglycerides: 486 mg/dL — ABNORMAL HIGH (ref 0–149)

## 2018-03-01 NOTE — Progress Notes (Signed)
OFFICE NOTE  Chief Complaint:  Routine follow-up  Primary Care Physician: Burman Freestone, MD  HPI:  Brooke Castillo is a 72 y/o, moderately obese WF, with a history of HTN, HLD, borderline DM and asthma who presents to the [facility] ER with a complaint of substernal chest pain and dizziness. She denies any past cardiac issues, but has a family h/o of CAD, with her father suffering an MI with subsequent CABG at age 7. Her mother also had CHF. 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 She denies any history of tobacco use, past or present. She states that she was in her usual state of health until yesterday. She noticed intermittent episodes of mild substernal chest pressure. Rated 3/10. Non radiating. No exacerbating factors. Occurred off and on throughout the day. Episodes lasted ~ 1-2 minutes each. Episodes occurred as rest with associated lightheadedness and mild SOB. No diaphoresis, n/v. She did not take any medicines for her discomfort. Today, while driving on the highway, she developed sudden onset of dizziness. She denies syncope/ near syncope. She had recurrent mild chest discomfort and slight SOB, but denies any other symptoms. This particular episode lasted for ~30 minutes before spontaneously resolving. Her symptoms concerned her, prompting her to come to the ER. She received ASA and a nitro patch. Her symptoms have resolved, but she now has a headache. W/u has revealed LBBB on EKG (no old EKG to compare to). Negative troponin x 1. CT of the head was unremarkable. CXR c/w minimal basilar atelectasis. She was admitted and ruled out for MI. Lexiscan myovue revealed no ischemia and an EF 67%. CT od her head was negative for acute abnormality. Lipid panel revealed TG of 557. She has an allergy to statin and niaspan. She takes lovaza at home.   In followup today she reports that she has had no further chest pain. She continues to have some anxiety  and occasionally gets lightheadedness and dizziness. She notes that her blood pressure did spike up to 190/97, but it is elevated only to 152/91 today. She has struggled with some lower extremity swelling.  I saw Brooke Castillo back in the office today. She's feeling quite well. She's had a number of different cholesterol test all indicating her cholesterol still not well controlled. She's been intolerant to most statins but recently is been taking Crestor 5 mg daily without any difficulty. Triglycerides also remained high. She also is on fenofibrate. She may be a good candidate for PCIS K9 inhibitor.  05/17/2015  Brooke Castillo returns today for follow-up. Unfortunately she's recently had some abnormal findings which prompted cardiovascular evaluation. She had been complaining of some tightness around her neck which is interpreted as possible chest pain. She's also had intermittent significant swelling in her lower extremities although has been up on her feet a lot helping her granddaughter move and sitting for long periods of time. Recently she had a problem with swallowing and underwent endoscopy and colonoscopy. During the procedure apparently she had an abnormal EKG finding and she was referred to see a cardiologist with [facility]. They had recommended numerous tests and possibly even a pacemaker. She declined doing those tests and ultimately decided to make a follow-up appointment with me. She saw Richardson Dopp my PA who did an extensive workup and recommended a nuclear stress test given her history of left bundle branch block in the past. This was recently performed was negative  again for ischemia with normal LV function. She reports now that she feels well.  03/01/2018  Brooke Castillo is seen today for follow-up.  I last saw her in 2017.  The time she underwent work-up for left bundle branch block and palpitations as well as recent chest pain.  Since then she is denied any recurrent symptoms.  Her  weight is actually come down significantly from 232 to 218 pounds.  EKG shows a persistent left bundle branch block but sinus rhythm with first-degree AV block.  She denies any chest pain or shortness of breath.  PMHx:  Past Medical History:  Diagnosis Date  . Arthritis   . Asthma   . Complication of anesthesia    IN 2011 I HAD COLON SURGERY & IT TOOK ME 2 DAYS TO WAKE UP   . Diverticulitis    s/p partial colectomy  . DM2 (diabetes mellitus, type 2) (HCC)    A1c in 12/16 was 6.6  . GERD (gastroesophageal reflux disease)   . History of echocardiogram    a. Echo 2/17 ([place]):  EF 55-60%  . History of nuclear stress test    a. Myoview 5/15: no ischemia, EF 67%  //  b. Myoview 3/17: EF 63%, septal defect c/w artifact, no ischemia; Low Risk  . History of renal failure 2004   accidently took too much aleve  . HLD (hyperlipidemia)   . Hymenoptera allergy   . Hypertension   . LBBB (left bundle branch block)    transient  . Rheumatic fever    as a child    Past Surgical History:  Procedure Laterality Date  . ABDOMINAL HYSTERECTOMY  1980   complete  . APPENDECTOMY  1980  . CHOLECYSTECTOMY  1989  . COLON SURGERY  2011   removed 11 inches of colon  . KNEE SURGERY Bilateral 2009  . SINOSCOPY    . TONSILLECTOMY      FAMHx:  Family History  Problem Relation Age of Onset  . Heart failure Mother   . COPD Mother   . Coronary artery disease Father 54       CABGx5; brain stem stroke  . Aneurysm Paternal Grandmother   . Heart disease Paternal Grandfather   . CAD Brother 51       stent  . Hypertension Sister     SOCHx:   reports that she has never smoked. She has never used smokeless tobacco. She reports that she does not drink alcohol or use drugs.  ALLERGIES:  Allergies  Allergen Reactions  . Ciprofloxacin Hives  . Codeine Other (See Comments)    "knocks me out"   . Diflucan [Fluconazole] Hives  . Niaspan [Niacin Er] Other (See Comments)    Feels "flush"  badly  . Statins Other (See Comments)    Joint pain  . Flagyl [Metronidazole] Palpitations    ROS: Pertinent items noted in HPI and remainder of comprehensive ROS otherwise negative.  HOME MEDS: Current Outpatient Medications  Medication Sig Dispense Refill  . acetaminophen (TYLENOL) 500 MG tablet Take 500 mg by mouth every 8 (eight) hours as needed (for pain).     Marland Kitchen albuterol (PROVENTIL HFA;VENTOLIN HFA) 108 (90 BASE) MCG/ACT inhaler Inhale 1-2 puffs into the lungs every 4 (four) hours as needed for wheezing or shortness of breath.    Marland Kitchen aspirin 325 MG tablet Take 325 mg by mouth daily.    . budesonide (RHINOCORT ALLERGY) 32 MCG/ACT nasal spray Place 1 spray into both nostrils every  Monday, Wednesday, and Friday.     . cholecalciferol (VITAMIN D) 1000 units tablet Take 1,000 Units by mouth daily.    . clotrimazole-betamethasone (LOTRISONE) cream Apply 1 application topically 2 (two) times daily as needed (FOR YEAST INFECTION).     Marland Kitchen cyclobenzaprine (FLEXERIL) 10 MG tablet Take 10 mg by mouth 3 (three) times daily as needed for muscle spasms.    Marland Kitchen EPINEPHrine 0.3 mg/0.3 mL IJ SOAJ injection Use as directed for life-threatening allergic reaction. (Patient taking differently: Inject 0.3 mg into the muscle once as needed (for life-threatening allergic reaction). ) 2 Device 3  . estradiol (ESTRACE) 1 MG tablet Take 0.5 mg by mouth every other day.    . fenofibrate 160 MG tablet Take 160 mg by mouth daily.    . fexofenadine (ALLEGRA) 180 MG tablet Take 180 mg by mouth every other day.     . Fluticasone Furoate (ARNUITY ELLIPTA) 200 MCG/ACT AEPB Inhale 1 puff into the lungs daily. 30 each 3  . furosemide (LASIX) 40 MG tablet Take 40 mg by mouth daily.    Marland Kitchen lisinopril (PRINIVIL,ZESTRIL) 20 MG tablet Take 20 mg by mouth daily.  11  . meclizine (ANTIVERT) 12.5 MG tablet Take 12.5 mg by mouth 3 (three) times daily as needed for dizziness.     . pantoprazole (PROTONIX) 40 MG tablet Take 40 mg by mouth  daily.    . ranitidine (ZANTAC) 300 MG tablet Take 1 tablet (300 mg total) by mouth at bedtime. 30 tablet 5   No current facility-administered medications for this visit.     LABS/IMAGING: No results found for this or any previous visit (from the past 48 hour(s)). No results found.  VITALS: BP (!) 159/76   Pulse 63   Ht 5\' 3"  (1.6 m)   Wt 218 lb 6.4 oz (99.1 kg)   BMI 38.69 kg/m   EXAM: General appearance: alert and no distress Neck: no carotid bruit, no JVD and thyroid not enlarged, symmetric, no tenderness/mass/nodules Lungs: clear to auscultation bilaterally Heart: regular rate and rhythm, S1, S2 normal, no murmur, click, rub or gallop Abdomen: soft, non-tender; bowel sounds normal; no masses,  no organomegaly Extremities: edema Trace, mild varicose veins Pulses: 2+ and symmetric Skin: Skin color, texture, turgor normal. No rashes or lesions Neurologic: Grossly normal Psych: Pleasant  EKG: Sinus rhythm first-degree AV block, left bundle branch block at 63-personally reviewed  ASSESSMENT: 1. Hypertension 2. Dyslipidemia - not at goal, on max tolerated dose statin and fenofibrate 3. LBBB 4. LE edema 5. Recent chest pain with a negative stress test, normal LV function  PLAN: 1.   Brooke Castillo denies any worsening shortness of breath or chest pain.  She is overdue for a lipid profile.  In addition she is on full dose aspirin and is recommended to be on low-dose aspirin 81 mg daily.  Her LBBB is stable.  We will contact her with the results of her lipid profile and adjust her medications accordingly.  Also noted her blood pressure was elevated today.  I like for her to follow that at home.  We may need to adjust her medications accordingly.  Follow-up with me in 2 months.  Pixie Casino, MD, [facility], [place] Director of the Advanced Lipid Disorders &  Cardiovascular Risk Reduction Clinic Diplomate of the American Board of Clinical  Lipidology Attending Cardiologist  Direct Dial: [phone]  Fax: [phone]  Website:  [email or web address]  03/01/2018, 8:53 AM

## 2018-03-01 NOTE — Patient Instructions (Signed)
Medication Instructions:  DECREASE aspirin to 81mg  daily If you need a refill on your cardiac medications before your next appointment, please call your pharmacy.   Lab work: LIPID PANEL - to check cholesterol If you have labs (blood work) drawn today and your tests are completely normal, you will receive your results only by: Marland Kitchen MyChart Message (if you have MyChart) OR . A paper copy in the mail If you have any lab test that is abnormal or we need to change your treatment, we will call you to review the results.  Testing/Procedures: NONE  Follow-Up: At [facility], you and your health needs are our priority.  As part of our continuing mission to provide you with exceptional heart care, we have created designated Provider Care Teams.  These Care Teams include your primary Cardiologist (physician) and Advanced Practice Providers (APPs -  Physician Assistants and Nurse Practitioners) who all work together to provide you with the care you need, when you need it. You will need a follow up appointment in 2 months. You may see Dr. Debara Pickett or one of the following Advanced Practice Providers on your designated Care Team: Almyra Deforest, [place] . Fabian Sharp, PA-C  Any Other Special Instructions Will Be Listed Below (If Applicable).

## 2018-03-08 ENCOUNTER — Telehealth: Payer: Self-pay | Admitting: Internal Medicine

## 2018-03-08 MED ORDER — ICOSAPENT ETHYL 1 G PO CAPS: 2.0000 g | ORAL_CAPSULE | Freq: Two times a day (BID) | ORAL | 11 refills | Status: DC

## 2018-03-08 NOTE — Telephone Encounter (Signed)
-----   Message from Pixie Casino, MD sent at 03/03/2018  5:57 PM EST ----- Cholesterol remains elevated - trigs >400. Recommend adding Vascepa 2G BID.  Dr. Lemmie Evens

## 2018-03-08 NOTE — Telephone Encounter (Signed)
Patient called w/results. She agrees w/MD plan to start Vascepa. Notified her that PA may be required, pharmacy will let us know. She will check with insurance as to what tier this medication falls on her plan.

## 2018-04-23 ENCOUNTER — Encounter: Payer: Self-pay | Admitting: Gastroenterology

## 2018-05-03 ENCOUNTER — Ambulatory Visit: Payer: Medicare Other | Admitting: Internal Medicine

## 2018-05-07 ENCOUNTER — Encounter: Payer: Self-pay | Admitting: Gastroenterology

## 2018-05-10 ENCOUNTER — Ambulatory Visit (INDEPENDENT_AMBULATORY_CARE_PROVIDER_SITE_OTHER): Payer: Medicare Other | Admitting: Gastroenterology

## 2018-05-10 ENCOUNTER — Encounter: Payer: Self-pay | Admitting: Gastroenterology

## 2018-05-10 VITALS — BP 126/78 | HR 58 | Ht 64.0 in | Wt 212.4 lb

## 2018-05-10 DIAGNOSIS — Z8719 Personal history of other diseases of the digestive system: Secondary | ICD-10-CM

## 2018-05-10 DIAGNOSIS — Z86010 Personal history of colonic polyps: Secondary | ICD-10-CM | POA: Diagnosis not present

## 2018-05-10 MED ORDER — MOVIPREP 100 G PO SOLR: 1.0000 | Freq: Once | ORAL | 0 refills | Status: AC

## 2018-05-10 NOTE — Patient Instructions (Signed)
If you are age 71 or older, your body mass index should be between 23-30. Your Body mass index is 36.45 kg/m. If this is out of the aforementioned range listed, please consider follow up with your Primary Care Provider.  If you are age 28 or younger, your body mass index should be between 19-25. Your Body mass index is 36.45 kg/m. If this is out of the aformentioned range listed, please consider follow up with your Primary Care Provider.   You have been scheduled for a colonoscopy. Please follow written instructions given to you at your visit today.  Please pick up your prep supplies at the pharmacy within the next 1-3 days. If you use inhalers (even only as needed), please bring them with you on the day of your procedure. Your physician has requested that you go to www.startemmi.com and enter the access code given to you at your visit today. This web site gives a general overview about your procedure. However, you should still follow specific instructions given to you by our office regarding your preparation for the procedure.  We have given you samples of the following medication to take: Moviprep  Thank you,  Dr. Jackquline Denmark

## 2018-05-10 NOTE — Progress Notes (Signed)
Chief Complaint: for colon  Referring Provider:  Burman Freestone, MD      ASSESSMENT AND PLAN;   #1. H/O Polyps (Dr Ferdinand Lango 2017, polyps).  #2. H/O diverticulitis s/p sigmoid resection 2011.  Plan: - Proceed with colonoscopy with Moviprep. Discussed risks & benefits. (Risks including rare perforation req laparotomy, bleeding after biopsies/polypectomy req blood transfusion, rare chance of missing neoplasms, risks of anesthesia/sedation). Benefits outweigh the risks. Patient agrees to proceed. All the questions were answered. Consent forms given for review.    HPI:    Brooke Castillo is a 73 y.o. female  For colon  No nausea, vomiting, heartburn, regurgitation, odynophagia or dysphagia.  No significant diarrhea or constipation.  There is no melena or hematochezia. No unintentional weight loss.  Had colon by Dr Ferdinand Lango- had polyps, told to get rpt colon 2020.   Past Medical History:  Diagnosis Date  . Arthritis   . Asthma   . Complication of anesthesia    IN 2011 I HAD COLON SURGERY & IT TOOK ME 2 DAYS TO WAKE UP   . Diverticulitis    s/p partial colectomy  . Diverticulosis   . DM2 (diabetes mellitus, type 2) (HCC)    A1c in 12/16 was 6.6  . GERD (gastroesophageal reflux disease)   . History of echocardiogram    a. Echo 2/17 ([facility]):  EF 55-60%  . History of nuclear stress test    a. Myoview 5/15: no ischemia, EF 67%  //  b. Myoview 3/17: EF 63%, septal defect c/w artifact, no ischemia; Low Risk  . History of renal failure 2004   accidently took too much aleve  . HLD (hyperlipidemia)   . Hx of colonic polyps   . Hymenoptera allergy   . Hypertension   . Hypertension   . LBBB (left bundle branch block)    transient  . Obesity   . Rheumatic fever    as a child    Past Surgical History:  Procedure Laterality Date  . ABDOMINAL HYSTERECTOMY  1980   complete  . APPENDECTOMY  1980  . CHOLECYSTECTOMY  1989  . COLON SURGERY  2011   removed 11 inches  of colon  . COLONOSCOPY  08/07/2008   Moderate sigmoid divertiuclosis. Small internal hemorrhoids. Otherwise normal colonoscopy  . COLONOSCOPY     around 2017 with Dr Ferdinand Lango at [facility]  . KNEE SURGERY Bilateral 2009  . SINOSCOPY    . TONSILLECTOMY      Family History  Problem Relation Age of Onset  . Heart failure Mother   . COPD Mother   . Coronary artery disease Father 89       CABGx5; brain stem stroke  . Colon polyps Father   . Aneurysm Paternal Grandmother   . Heart disease Paternal Grandfather   . CAD Brother 15       stent  . Bone cancer Brother   . Prostate cancer Brother   . Hypertension Sister   . Diabetes Sister   . Colon cancer Neg Hx   . Esophageal cancer Neg Hx     Social History   Tobacco Use  . Smoking status: Never Smoker  . Smokeless tobacco: Never Used  Substance Use Topics  . Alcohol use: No  . Drug use: No    Current Outpatient Medications  Medication Sig Dispense Refill  . acetaminophen (TYLENOL) 500 MG tablet Take 500 mg by mouth every 8 (eight) hours as needed (for pain).     Marland Kitchen  albuterol (PROVENTIL HFA;VENTOLIN HFA) 108 (90 BASE) MCG/ACT inhaler Inhale 1-2 puffs into the lungs every 4 (four) hours as needed for wheezing or shortness of breath.    Marland Kitchen aspirin EC 81 MG tablet Take 81 mg by mouth daily.    . budesonide (RHINOCORT ALLERGY) 32 MCG/ACT nasal spray Place 1 spray into both nostrils every Monday, Wednesday, and Friday.     . cholecalciferol (VITAMIN D) 1000 units tablet Take 1,000 Units by mouth daily.    . clotrimazole-betamethasone (LOTRISONE) cream Apply 1 application topically 2 (two) times daily as needed (FOR YEAST INFECTION).     Marland Kitchen cyclobenzaprine (FLEXERIL) 10 MG tablet Take 10 mg by mouth 3 (three) times daily as needed for muscle spasms.    Marland Kitchen EPINEPHrine 0.3 mg/0.3 mL IJ SOAJ injection Use as directed for life-threatening allergic reaction. (Patient taking differently: Inject 0.3 mg into the muscle once as needed  (for life-threatening allergic reaction). ) 2 Device 3  . estradiol (ESTRACE) 1 MG tablet Take 0.5 mg by mouth every other day.    . fenofibrate 160 MG tablet Take 160 mg by mouth daily.    . fexofenadine (ALLEGRA) 180 MG tablet Take 180 mg by mouth every other day.     . Fluticasone Furoate (ARNUITY ELLIPTA) 200 MCG/ACT AEPB Inhale 1 puff into the lungs daily. 30 each 3  . furosemide (LASIX) 40 MG tablet Take 40 mg by mouth daily.    Vanessa Kick Ethyl 1 g CAPS Take 2 capsules (2 g total) by mouth 2 (two) times daily. 120 capsule 11  . lisinopril (PRINIVIL,ZESTRIL) 40 MG tablet Take 40 mg by mouth daily.    . meclizine (ANTIVERT) 12.5 MG tablet Take 12.5 mg by mouth 3 (three) times daily as needed for dizziness.     . metFORMIN (GLUCOPHAGE) 500 MG tablet Take 500 mg by mouth 2 (two) times daily.    . pantoprazole (PROTONIX) 40 MG tablet Take 40 mg by mouth daily.     No current facility-administered medications for this visit.     Allergies  Allergen Reactions  . Ciprofloxacin Hives  . Codeine Other (See Comments)    "knocks me out"   . Diflucan [Fluconazole] Hives  . Niaspan [Niacin Er] Other (See Comments)    Feels "flush" badly  . Statins Other (See Comments)    Joint pain  . Flagyl [Metronidazole] Palpitations    Review of Systems:  Constitutional: Denies fever, chills, diaphoresis, appetite change and fatigue.  HEENT: Denies photophobia, eye pain, redness, hearing loss, ear pain, congestion, sore throat, rhinorrhea, sneezing, mouth sores, neck pain, neck stiffness and tinnitus.   Respiratory: Denies SOB, DOE, cough, chest tightness,  and wheezing.   Cardiovascular: Denies chest pain, palpitations and leg swelling.  Genitourinary: Denies dysuria, urgency, frequency, hematuria, flank pain and difficulty urinating.  Musculoskeletal: Denies myalgias, back pain, joint swelling, arthralgias and gait problem.  Skin: No rash.  Neurological: Denies dizziness, seizures, syncope,  weakness, light-headedness, numbness and headaches.  Hematological: Denies adenopathy. Easy bruising, personal or family bleeding history  Psychiatric/Behavioral: No anxiety or depression     Physical Exam:    BP 126/78   Pulse (!) 58   Ht 5\' 4"  (1.626 m)   Wt 212 lb 6 oz (96.3 kg)   BMI 36.45 kg/m  Filed Weights   05/10/18 0956  Weight: 212 lb 6 oz (96.3 kg)   Constitutional:  Well-developed, in no acute distress. Psychiatric: Normal mood and affect. Behavior is normal. HEENT: Pupils normal.  Conjunctivae are normal. No scleral icterus. Neck supple.  Cardiovascular: Normal rate, regular rhythm. No edema Pulmonary/chest: Effort normal and breath sounds normal. No wheezing, rales or rhonchi. Abdominal: Soft, nondistended. Nontender. Bowel sounds active throughout. There are no masses palpable. No hepatomegaly. Rectal:  defered Neurological: Alert and oriented to person place and time. Skin: Skin is warm and dry. No rashes noted.  Data Reviewed: I have personally reviewed following labs and imaging studies  CBC: CBC Latest Ref Rng & Units 05/29/2016 07/21/2013 07/20/2013  WBC 4.0 - 10.5 K/uL 6.7 5.8 7.9  Hemoglobin 12.0 - 15.0 g/dL 15.2(H) 14.2 15.5(H)  Hematocrit 36.0 - 46.0 % 43.2 42.0 44.1  Platelets 150 - 400 K/uL 230 224 234    CMP: CMP Latest Ref Rng & Units 05/29/2016 07/21/2013 07/20/2013  Glucose 65 - 99 mg/dL 265(H) 100(H) 146(H)  BUN 6 - 20 mg/dL 15 20 22   Creatinine 0.44 - 1.00 mg/dL 0.68 0.59 0.67  Sodium 135 - 145 mmol/L 136 139 139  Potassium 3.5 - 5.1 mmol/L 3.7 3.9 4.4  Chloride 101 - 111 mmol/L 98(L) 100 99  CO2 22 - 32 mmol/L 26 23 24   Calcium 8.9 - 10.3 mg/dL 9.5 9.0 9.2  Total Protein 6.5 - 8.1 g/dL 6.9 - -  Total Bilirubin 0.3 - 1.2 mg/dL 0.6 - -  Alkaline Phos 38 - 126 U/L 64 - -  AST 15 - 41 U/L 32 - -  ALT 14 - 54 U/L 32 - -      Carmell Austria, MD 05/10/2018, 10:10 AM  Cc: Burman Freestone, MD

## 2018-05-14 ENCOUNTER — Ambulatory Visit (AMBULATORY_SURGERY_CENTER): Payer: Medicare Other | Admitting: Gastroenterology

## 2018-05-14 ENCOUNTER — Encounter: Payer: Self-pay | Admitting: Gastroenterology

## 2018-05-14 VITALS — BP 105/56 | HR 62 | Temp 96.8°F | Resp 20 | Ht 64.0 in | Wt 212.0 lb

## 2018-05-14 DIAGNOSIS — D123 Benign neoplasm of transverse colon: Secondary | ICD-10-CM

## 2018-05-14 DIAGNOSIS — Z86010 Personal history of colonic polyps: Secondary | ICD-10-CM

## 2018-05-14 MED ORDER — SODIUM CHLORIDE 0.9 % IV SOLN: 500.0000 mL | Freq: Once | INTRAVENOUS | Status: DC

## 2018-05-14 NOTE — Progress Notes (Signed)
Report to PACU, RN, vss, BBS= Clear.  

## 2018-05-14 NOTE — Op Note (Signed)
[facility] Patient Name: Brooke Castillo Procedure Date: 05/14/2018 8:30 AM MRN: [id number] Endoscopist: Jackquline Denmark , MD Age: 73 Referring MD:  Date of Birth: [birth year] Gender: Female Account #: [id number] Procedure:                Colonoscopy Indications:              High risk colon cancer surveillance: Personal                            history of colonic polyps Medicines:                Monitored Anesthesia Care Procedure:                Pre-Anesthesia Assessment:                           - Prior to the procedure, a History and Physical                            was performed, and patient medications and                            allergies were reviewed. The patient's tolerance of                            previous anesthesia was also reviewed. The risks                            and benefits of the procedure and the sedation                            options and risks were discussed with the patient.                            All questions were answered, and informed consent                            was obtained. Prior Anticoagulants: The patient has                            taken no previous anticoagulant or antiplatelet                            agents. ASA Grade Assessment: II - A patient with                            mild systemic disease. After reviewing the risks                            and benefits, the patient was deemed in                            satisfactory condition to undergo the procedure.  After obtaining informed consent, the colonoscope                            was passed under direct vision. Throughout the                            procedure, the patient's blood pressure, pulse, and                            oxygen saturations were monitored continuously. The                            Colonoscope was introduced through the anus and                            advanced to the 2 cm into the ileum. The  quality of                            the bowel preparation was excellent. The terminal                            ileum, ileocecal valve, appendiceal orifice, and                            rectum were photographed. The colonoscopy was                            performed without difficulty. The patient tolerated                            the procedure well. Scope In: 8:34:01 AM Scope Out: 8:41:59 AM Scope Withdrawal Time: 0 hours 6 minutes 26 seconds  Total Procedure Duration: 0 hours 7 minutes 58 seconds  Findings:                 A 2 mm polyp was found in the hepatic flexure. The                            polyp was sessile. The polyp was removed with a                            cold biopsy forceps. Resection and retrieval were                            complete. Estimated blood loss: none.                           A few small-mouthed diverticula were found in the                            neo-sigmoid colon, descending colon and ascending                            colon.  There was evidence of a prior end-to-end                            colo-colonic anastomosis in the sigmoid colon, 20                            cm from the anal verge. This was patent and was                            characterized by healthy appearing mucosa. The                            anastomosis was traversed.                           Non-bleeding internal hemorrhoids were found during                            retroflexion. The hemorrhoids were small.                           The terminal ileum appeared normal.                           The exam was otherwise without abnormality on                            direct and retroflexion views. Complications:            No immediate complications. Estimated Blood Loss:     Estimated blood loss: none. Impression:               -Diminutive colonic polyp status post polypectomy.                           -Mild pancolonic  diverticulosis.                           -Status post sigmoid resection. Patent anastomosis. Recommendation:           - Patient has a contact number available for                            emergencies. The signs and symptoms of potential                            delayed complications were discussed with the                            patient. Return to normal activities tomorrow.                            Written discharge instructions were provided to the                            patient.                           -  Resume previous diet.                           - Continue present medications.                           - Await pathology results.                           - Repeat colonoscopy for surveillance based on                            pathology results.                           - Return to GI clinic PRN. Jackquline Denmark, MD 05/14/2018 8:47:38 AM This report has been signed electronically.

## 2018-05-14 NOTE — Progress Notes (Signed)
Called to room to assist during endoscopic procedure.  Patient ID and intended procedure confirmed with present staff. Received instructions for my participation in the procedure from the performing physician.  

## 2018-05-14 NOTE — Patient Instructions (Signed)
YOU HAD AN ENDOSCOPIC PROCEDURE TODAY AT THE [facility]:   Refer to the procedure report that was given to you for any specific questions about what was found during the examination.  If the procedure report does not answer your questions, please call your gastroenterologist to clarify.  If you requested that your care partner not be given the details of your procedure findings, then the procedure report has been included in a sealed envelope for you to review at your convenience later.  YOU SHOULD EXPECT: Some feelings of bloating in the abdomen. Passage of more gas than usual.  Walking can help get rid of the air that was put into your GI tract during the procedure and reduce the bloating. If you had a lower endoscopy (such as a colonoscopy or flexible sigmoidoscopy) you may notice spotting of blood in your stool or on the toilet paper. If you underwent a bowel prep for your procedure, you may not have a normal bowel movement for a few days.  Please Note:  You might notice some irritation and congestion in your nose or some drainage.  This is from the oxygen used during your procedure.  There is no need for concern and it should clear up in a day or so.  SYMPTOMS TO REPORT IMMEDIATELY:   Following lower endoscopy (colonoscopy or flexible sigmoidoscopy):  Excessive amounts of blood in the stool  Significant tenderness or worsening of abdominal pains  Swelling of the abdomen that is new, acute  Fever of 100F or higher   For urgent or emergent issues, a gastroenterologist can be reached at any hour by calling [phone].   DIET:  We do recommend a small meal at first, but then you may proceed to your regular diet.  Drink plenty of fluids but you should avoid alcoholic beverages for 24 hours.  ACTIVITY:  You should plan to take it easy for the rest of today and you should NOT DRIVE or use heavy machinery until tomorrow (because of the sedation medicines used during the test).     FOLLOW UP: Our staff will call the number listed on your records the next business day following your procedure to check on you and address any questions or concerns that you may have regarding the information given to you following your procedure. If we do not reach you, we will leave a message.  However, if you are feeling well and you are not experiencing any problems, there is no need to return our call.  We will assume that you have returned to your regular daily activities without incident.  If any biopsies were taken you will be contacted by phone or by letter within the next 1-3 weeks.  Please call us at [phone] if you have not heard about the biopsies in 3 weeks.    SIGNATURES/CONFIDENTIALITY: You and/or your care partner have signed paperwork which will be entered into your electronic medical record.  These signatures attest to the fact that that the information above on your After Visit Summary has been reviewed and is understood.  Full responsibility of the confidentiality of this discharge information lies with you and/or your care-partner.  Read all handouts given to you by your recovery room nurse. 

## 2018-05-17 ENCOUNTER — Telehealth: Payer: Self-pay | Admitting: *Deleted

## 2018-05-17 NOTE — Telephone Encounter (Signed)
  Follow up Call-  Call back number 05/14/2018  Post procedure Call Back phone  # [phone]  Permission to leave phone message Yes  Some recent data might be hidden     Patient questions:  Do you have a fever, pain , or abdominal swelling? No. Pain Score  0 *  Have you tolerated food without any problems? Yes.    Have you been able to return to your normal activities? Yes.    Do you have any questions about your discharge instructions: Diet   No. Medications  No. Follow up visit  No.  Do you have questions or concerns about your Care? No.  Actions: * If pain score is 4 or above: No action needed, pain <4.

## 2018-05-23 ENCOUNTER — Encounter: Payer: Self-pay | Admitting: Gastroenterology

## 2021-03-28 DIAGNOSIS — J019 Acute sinusitis, unspecified: Secondary | ICD-10-CM | POA: Diagnosis not present

## 2021-05-20 DIAGNOSIS — Z1231 Encounter for screening mammogram for malignant neoplasm of breast: Secondary | ICD-10-CM | POA: Diagnosis not present

## 2021-07-04 DIAGNOSIS — K219 Gastro-esophageal reflux disease without esophagitis: Secondary | ICD-10-CM | POA: Diagnosis not present

## 2021-07-04 DIAGNOSIS — E785 Hyperlipidemia, unspecified: Secondary | ICD-10-CM | POA: Diagnosis not present

## 2021-07-04 DIAGNOSIS — I10 Essential (primary) hypertension: Secondary | ICD-10-CM | POA: Diagnosis not present

## 2021-07-04 DIAGNOSIS — E1165 Type 2 diabetes mellitus with hyperglycemia: Secondary | ICD-10-CM | POA: Diagnosis not present

## 2021-09-26 DIAGNOSIS — E119 Type 2 diabetes mellitus without complications: Secondary | ICD-10-CM | POA: Diagnosis not present

## 2021-10-04 DIAGNOSIS — N951 Menopausal and female climacteric states: Secondary | ICD-10-CM | POA: Diagnosis not present

## 2021-10-04 DIAGNOSIS — E559 Vitamin D deficiency, unspecified: Secondary | ICD-10-CM | POA: Diagnosis not present

## 2021-10-04 DIAGNOSIS — I10 Essential (primary) hypertension: Secondary | ICD-10-CM | POA: Diagnosis not present

## 2021-10-04 DIAGNOSIS — E785 Hyperlipidemia, unspecified: Secondary | ICD-10-CM | POA: Diagnosis not present

## 2021-10-04 DIAGNOSIS — E1165 Type 2 diabetes mellitus with hyperglycemia: Secondary | ICD-10-CM | POA: Diagnosis not present

## 2022-01-15 DIAGNOSIS — Z23 Encounter for immunization: Secondary | ICD-10-CM | POA: Diagnosis not present

## 2022-01-15 DIAGNOSIS — I10 Essential (primary) hypertension: Secondary | ICD-10-CM | POA: Diagnosis not present

## 2022-01-15 DIAGNOSIS — Z0000 Encounter for general adult medical examination without abnormal findings: Secondary | ICD-10-CM | POA: Diagnosis not present

## 2022-01-15 DIAGNOSIS — E782 Mixed hyperlipidemia: Secondary | ICD-10-CM | POA: Diagnosis not present

## 2022-01-24 DIAGNOSIS — H6592 Unspecified nonsuppurative otitis media, left ear: Secondary | ICD-10-CM | POA: Diagnosis not present

## 2022-02-13 DIAGNOSIS — M25562 Pain in left knee: Secondary | ICD-10-CM | POA: Diagnosis not present

## 2022-02-13 DIAGNOSIS — Z96653 Presence of artificial knee joint, bilateral: Secondary | ICD-10-CM | POA: Diagnosis not present

## 2022-02-13 DIAGNOSIS — Z96652 Presence of left artificial knee joint: Secondary | ICD-10-CM | POA: Diagnosis not present

## 2022-02-13 DIAGNOSIS — M25552 Pain in left hip: Secondary | ICD-10-CM | POA: Diagnosis not present

## 2022-02-13 DIAGNOSIS — M533 Sacrococcygeal disorders, not elsewhere classified: Secondary | ICD-10-CM | POA: Diagnosis not present

## 2022-02-13 DIAGNOSIS — M79605 Pain in left leg: Secondary | ICD-10-CM | POA: Diagnosis not present

## 2022-02-17 DIAGNOSIS — L814 Other melanin hyperpigmentation: Secondary | ICD-10-CM | POA: Diagnosis not present

## 2022-02-17 DIAGNOSIS — L570 Actinic keratosis: Secondary | ICD-10-CM | POA: Diagnosis not present

## 2022-02-17 DIAGNOSIS — L578 Other skin changes due to chronic exposure to nonionizing radiation: Secondary | ICD-10-CM | POA: Diagnosis not present

## 2022-02-17 DIAGNOSIS — L820 Inflamed seborrheic keratosis: Secondary | ICD-10-CM | POA: Diagnosis not present

## 2022-02-27 DIAGNOSIS — M5459 Other low back pain: Secondary | ICD-10-CM | POA: Diagnosis not present

## 2022-02-27 DIAGNOSIS — M79605 Pain in left leg: Secondary | ICD-10-CM | POA: Diagnosis not present

## 2022-02-27 DIAGNOSIS — Z96653 Presence of artificial knee joint, bilateral: Secondary | ICD-10-CM | POA: Diagnosis not present

## 2022-02-27 DIAGNOSIS — M25552 Pain in left hip: Secondary | ICD-10-CM | POA: Diagnosis not present

## 2022-03-14 DIAGNOSIS — M79605 Pain in left leg: Secondary | ICD-10-CM | POA: Diagnosis not present

## 2022-03-14 DIAGNOSIS — M5459 Other low back pain: Secondary | ICD-10-CM | POA: Diagnosis not present

## 2022-03-14 DIAGNOSIS — M4316 Spondylolisthesis, lumbar region: Secondary | ICD-10-CM | POA: Diagnosis not present

## 2022-03-31 DIAGNOSIS — M6281 Muscle weakness (generalized): Secondary | ICD-10-CM | POA: Diagnosis not present

## 2022-03-31 DIAGNOSIS — S39012D Strain of muscle, fascia and tendon of lower back, subsequent encounter: Secondary | ICD-10-CM | POA: Diagnosis not present

## 2022-04-02 DIAGNOSIS — M6281 Muscle weakness (generalized): Secondary | ICD-10-CM | POA: Diagnosis not present

## 2022-04-02 DIAGNOSIS — S39012D Strain of muscle, fascia and tendon of lower back, subsequent encounter: Secondary | ICD-10-CM | POA: Diagnosis not present

## 2022-04-07 DIAGNOSIS — S39012D Strain of muscle, fascia and tendon of lower back, subsequent encounter: Secondary | ICD-10-CM | POA: Diagnosis not present

## 2022-04-07 DIAGNOSIS — M6281 Muscle weakness (generalized): Secondary | ICD-10-CM | POA: Diagnosis not present

## 2022-04-08 ENCOUNTER — Encounter: Payer: Self-pay | Admitting: Gastroenterology

## 2022-04-08 DIAGNOSIS — K080 Exfoliation of teeth due to systemic causes: Secondary | ICD-10-CM | POA: Diagnosis not present

## 2022-04-09 ENCOUNTER — Ambulatory Visit: Payer: Medicare Other | Admitting: Gastroenterology

## 2022-04-09 ENCOUNTER — Other Ambulatory Visit (INDEPENDENT_AMBULATORY_CARE_PROVIDER_SITE_OTHER): Payer: Medicare Other

## 2022-04-09 ENCOUNTER — Encounter: Payer: Self-pay | Admitting: Gastroenterology

## 2022-04-09 VITALS — BP 128/86 | HR 63 | Ht 64.0 in | Wt 204.0 lb

## 2022-04-09 DIAGNOSIS — S39012D Strain of muscle, fascia and tendon of lower back, subsequent encounter: Secondary | ICD-10-CM | POA: Diagnosis not present

## 2022-04-09 DIAGNOSIS — R1031 Right lower quadrant pain: Secondary | ICD-10-CM

## 2022-04-09 DIAGNOSIS — Z86010 Personal history of colonic polyps: Secondary | ICD-10-CM

## 2022-04-09 DIAGNOSIS — Z8719 Personal history of other diseases of the digestive system: Secondary | ICD-10-CM | POA: Diagnosis not present

## 2022-04-09 DIAGNOSIS — M6281 Muscle weakness (generalized): Secondary | ICD-10-CM | POA: Diagnosis not present

## 2022-04-09 LAB — COMPREHENSIVE METABOLIC PANEL
ALT: 16 U/L (ref 0–35)
AST: 21 U/L (ref 0–37)
Albumin: 4.4 g/dL (ref 3.5–5.2)
Alkaline Phosphatase: 47 U/L (ref 39–117)
BUN: 20 mg/dL (ref 6–23)
CO2: 32 mEq/L (ref 19–32)
Calcium: 9.6 mg/dL (ref 8.4–10.5)
Chloride: 98 mEq/L (ref 96–112)
Creatinine, Ser: 0.77 mg/dL (ref 0.40–1.20)
GFR: 76.05 mL/min (ref >60.00)
Glucose, Bld: 154 mg/dL — ABNORMAL HIGH (ref 70–99)
Potassium: 4.2 mEq/L (ref 3.5–5.1)
Sodium: 138 mEq/L (ref 135–145)
Total Bilirubin: 0.4 mg/dL (ref 0.2–1.2)
Total Protein: 7.5 g/dL (ref 6.0–8.3)

## 2022-04-09 LAB — CBC WITH DIFFERENTIAL/PLATELET
Basophils Absolute: 0.1 10*3/uL (ref 0.0–0.1)
Basophils Relative: 0.8 % (ref 0.0–3.0)
Eosinophils Absolute: 0.1 10*3/uL (ref 0.0–0.7)
Eosinophils Relative: 2 % (ref 0.0–5.0)
HCT: 41.4 % (ref 36.0–46.0)
Hemoglobin: 14.4 g/dL (ref 12.0–15.0)
Lymphocytes Relative: 33.4 % (ref 12.0–46.0)
Lymphs Abs: 2.3 10*3/uL (ref 0.7–4.0)
MCHC: 34.8 g/dL (ref 30.0–36.0)
MCV: 89.7 fl (ref 78.0–100.0)
Monocytes Absolute: 0.4 10*3/uL (ref 0.1–1.0)
Monocytes Relative: 6.3 % (ref 3.0–12.0)
Neutro Abs: 4 10*3/uL (ref 1.4–7.7)
Neutrophils Relative %: 57.5 % (ref 43.0–77.0)
Platelets: 285 10*3/uL (ref 150.0–400.0)
RBC: 4.61 Mil/uL (ref 3.87–5.11)
RDW: 12.4 % (ref 11.5–15.5)
WBC: 6.9 10*3/uL (ref 4.0–10.5)

## 2022-04-09 MED ORDER — AMOXICILLIN-POT CLAVULANATE 875-125 MG PO TABS: 1.0000 | ORAL_TABLET | Freq: Two times a day (BID) | ORAL | 0 refills | Status: DC

## 2022-04-09 NOTE — Progress Notes (Signed)
Chief Complaint: FU  Referring Provider:  Burman Freestone, MD      ASSESSMENT AND PLAN;   #1. RLQ pain. Presumed acute R diverticulitis. Got better with 3 days of amox (given for dental work)  #3. H/O Polyps (Dr Ferdinand Lango 2017, polyps).  #3. H/O diverticulitis s/p sigmoid resection 2011.  Plan: -CBC, CMP today -Augmentin 875 BID x 5 days -If still with problems or worsening, then proceed with CT AP with contrast -Rpt colon 04/2025 if clinically indicated.     HPI:    Brooke Castillo is a 77 y.o. female  With DM2 RLQ pain x 1 week No fever or chills Felt better when she was given amoxicillin x 3 days for dental reasons. This is similar to when she had acute diverticulitis.  No nausea, vomiting, heartburn, regurgitation, odynophagia or dysphagia.  No significant diarrhea or constipation.  There is no melena or hematochezia. No unintentional weight loss.   Previous GI workup: Colonoscopy 04/2018: -Diminutive colonic polyp s/p polypectomy. Bx- TA -Mild pancolonic diverticulosis -S/p sigmoid resection.  Patent anastomosis. -Repeat in 7 years if clinically indicated  Had colon by Dr Ferdinand Lango- had polyps 2015   Past Medical History:  Diagnosis Date   Allergy    Arthritis    Asthma    Complication of anesthesia    IN 2011 I HAD COLON SURGERY & IT TOOK ME 2 DAYS TO WAKE UP    Diverticulitis    s/p partial colectomy   Diverticulosis    DM2 (diabetes mellitus, type 2) ([place])    A1c in 12/16 was 6.6   GERD (gastroesophageal reflux disease)    History of echocardiogram    a. Echo 2/17 ([facility]):  EF 55-60%   History of nuclear stress test    a. Myoview 5/15: no ischemia, EF 67%  //  b. Myoview 3/17: EF 63%, septal defect c/w artifact, no ischemia; Low Risk   History of renal failure 2004   accidently took too much aleve   HLD (hyperlipidemia)    Hx of colonic polyps    Hymenoptera allergy    Hypertension    Hypertension    LBBB (left bundle branch block)     transient   Obesity    Rheumatic fever    as a child    Past Surgical History:  Procedure Laterality Date   ABDOMINAL HYSTERECTOMY  1980   complete   [place]  2011   removed 11 inches of colon   COLONOSCOPY  08/07/2008   Moderate sigmoid divertiuclosis. Small internal hemorrhoids. Otherwise normal colonoscopy   COLONOSCOPY     around 2017 with Dr Ferdinand Lango at [place] Bilateral 2009   SINOSCOPY     TONSILLECTOMY      Family History  Problem Relation Age of Onset   Heart failure Mother    COPD Mother    Coronary artery disease Father 24       CABGx5; brain stem stroke   Colon polyps Father    Hypertension Sister    Diabetes Sister    Breast cancer Sister    CAD Brother 67       stent   Bone cancer Brother    Prostate cancer Brother    Aneurysm Paternal Grandmother    Heart disease Paternal Grandfather    Colon cancer Neg Hx    Esophageal cancer Neg Hx  Stomach cancer Neg Hx    Rectal cancer Neg Hx     Social History   Tobacco Use   Smoking status: Never   Smokeless tobacco: Never  Vaping Use   Vaping Use: Never used  Substance Use Topics   Alcohol use: No   Drug use: No    Current Outpatient Medications  Medication Sig Dispense Refill   acetaminophen (TYLENOL) 500 MG tablet Take 500 mg by mouth every 8 (eight) hours as needed (for pain).      albuterol (PROVENTIL HFA;VENTOLIN HFA) 108 (90 BASE) MCG/ACT inhaler Inhale 1-2 puffs into the lungs every 4 (four) hours as needed for wheezing or shortness of breath.     aspirin EC 81 MG tablet Take 81 mg by mouth daily.     budesonide (RHINOCORT ALLERGY) 32 MCG/ACT nasal spray Place 1 spray into both nostrils every Monday, Wednesday, and Friday.      cholecalciferol (VITAMIN D) 1000 units tablet Take 1,000 Units by mouth daily.     EPINEPHrine 0.3 mg/0.3 mL IJ SOAJ injection Use as directed for life-threatening allergic reaction.  (Patient taking differently: Inject 0.3 mg into the muscle once as needed (for life-threatening allergic reaction).) 2 Device 3   estradiol (ESTRACE) 1 MG tablet Take 0.5 mg by mouth every other day.     fenofibrate 160 MG tablet Take 160 mg by mouth daily.     fexofenadine (ALLEGRA) 180 MG tablet Take 180 mg by mouth every other day.      furosemide (LASIX) 40 MG tablet Take 40 mg by mouth daily.     glipiZIDE (GLUCOTROL XL) 2.5 MG 24 hr tablet Take 2.5 mg by mouth daily with breakfast.     lisinopril (PRINIVIL,ZESTRIL) 40 MG tablet Take 40 mg by mouth daily.     meclizine (ANTIVERT) 12.5 MG tablet Take 12.5 mg by mouth 3 (three) times daily as needed for dizziness.      pantoprazole (PROTONIX) 40 MG tablet Take 40 mg by mouth daily.     ADVAIR DISKUS 250-50 MCG/DOSE AEPB Inhale 1 puff into the lungs 2 (two) times daily. (Patient not taking: Reported on 04/09/2022)     clotrimazole-betamethasone (LOTRISONE) cream Apply 1 application topically 2 (two) times daily as needed (FOR YEAST INFECTION).  (Patient not taking: Reported on 04/09/2022)     cyclobenzaprine (FLEXERIL) 10 MG tablet Take 10 mg by mouth 3 (three) times daily as needed for muscle spasms. (Patient not taking: Reported on 04/09/2022)     Fluticasone Furoate (ARNUITY ELLIPTA) 200 MCG/ACT AEPB Inhale 1 puff into the lungs daily. (Patient not taking: Reported on 04/09/2022) 30 each 3   Icosapent Ethyl 1 g CAPS Take 2 capsules (2 g total) by mouth 2 (two) times daily. (Patient not taking: Reported on 04/09/2022) 120 capsule 11   metFORMIN (GLUCOPHAGE) 500 MG tablet Take 500 mg by mouth 2 (two) times daily. (Patient not taking: Reported on 04/09/2022)     No current facility-administered medications for this visit.    Allergies  Allergen Reactions   Ciprofloxacin Hives   Codeine Other (See Comments)    "knocks me out"    Diflucan [Fluconazole] Hives   Niaspan [Niacin Er] Other (See Comments)    Feels "flush" badly   Statins Other (See  Comments)    Joint pain   Flagyl [Metronidazole] Palpitations    Review of Systems:  Constitutional: Denies fever, chills, diaphoresis, appetite change and fatigue.  HEENT: Denies photophobia, eye pain, redness, hearing loss, ear pain,  congestion, sore throat, rhinorrhea, sneezing, mouth sores, neck pain, neck stiffness and tinnitus.   Respiratory: Denies SOB, DOE, cough, chest tightness,  and wheezing.   Cardiovascular: Denies chest pain, palpitations and leg swelling.  Genitourinary: Denies dysuria, urgency, frequency, hematuria, flank pain and difficulty urinating.  Musculoskeletal: Denies myalgias, back pain, joint swelling, arthralgias and gait problem.  Skin: No rash.  Neurological: Denies dizziness, seizures, syncope, weakness, light-headedness, numbness and headaches.  Hematological: Denies adenopathy. Easy bruising, personal or family bleeding history  Psychiatric/Behavioral: No anxiety or depression     Physical Exam:    BP 128/86   Pulse 63   Ht 5' 4"$  (1.626 m)   Wt 204 lb (92.5 kg)   SpO2 96%   BMI 35.02 kg/m  Filed Weights   04/09/22 0957  Weight: 204 lb (92.5 kg)   Constitutional:  Well-developed, in no acute distress. Psychiatric: Normal mood and affect. Behavior is normal. HEENT: Pupils normal.  Conjunctivae are normal. No scleral icterus. Neck supple.  Cardiovascular: Normal rate, regular rhythm. No edema Pulmonary/chest: Effort normal and breath sounds normal. No wheezing, rales or rhonchi. Abdominal: Soft, nondistended. Nontender. Bowel sounds active throughout. There are no masses palpable. No hepatomegaly. Rectal:  defered Neurological: Alert and oriented to person place and time. Skin: Skin is warm and dry. No rashes noted.  Data Reviewed: I have personally reviewed following labs and imaging studies  CBC:    Latest Ref Rng & Units 05/29/2016    3:59 PM 07/21/2013    5:54 AM 07/20/2013    2:41 PM  CBC  WBC 4.0 - 10.5 K/uL 6.7  5.8  7.9    Hemoglobin 12.0 - 15.0 g/dL 15.2  14.2  15.5   Hematocrit 36.0 - 46.0 % 43.2  42.0  44.1   Platelets 150 - 400 K/uL 230  224  234     CMP:    Latest Ref Rng & Units 05/29/2016    3:59 PM 07/21/2013    5:54 AM 07/20/2013    2:41 PM  CMP  Glucose 65 - 99 mg/dL 265  100  146   BUN 6 - 20 mg/dL 15  20  22   $ Creatinine 0.44 - 1.00 mg/dL 0.68  0.59  0.67   Sodium 135 - 145 mmol/L 136  139  139   Potassium 3.5 - 5.1 mmol/L 3.7  3.9  4.4   Chloride 101 - 111 mmol/L 98  100  99   CO2 22 - 32 mmol/L 26  23  24   $ Calcium 8.9 - 10.3 mg/dL 9.5  9.0  9.2   Total Protein 6.5 - 8.1 g/dL 6.9     Total Bilirubin 0.3 - 1.2 mg/dL 0.6     Alkaline Phos 38 - 126 U/L 64     AST 15 - 41 U/L 32     ALT 14 - 54 U/L 32         Carmell Austria, MD 04/09/2022, 10:21 AM  Cc: Burman Freestone, MD

## 2022-04-09 NOTE — Patient Instructions (Signed)
_______________________________________________________  If your blood pressure at your visit was 140/90 or greater, please contact your primary care physician to follow up on this.  _______________________________________________________  If you are age 75 or older, your body mass index should be between 23-30. Your Body mass index is 35.02 kg/m. If this is out of the aforementioned range listed, please consider follow up with your Primary Care Provider.  If you are age 51 or younger, your body mass index should be between 19-25. Your Body mass index is 35.02 kg/m. If this is out of the aformentioned range listed, please consider follow up with your Primary Care Provider.   ________________________________________________________  The  GI providers would like to encourage you to use [facility] to communicate with providers for non-urgent requests or questions.  Due to long hold times on the telephone, sending your provider a message by [facility] may be a faster and more efficient way to get a response.  Please allow 48 business hours for a response.  Please remember that this is for non-urgent requests.  _______________________________________________________  Your provider has requested that you go to the basement level for lab work before leaving today. Press "B" on the elevator. The lab is located at the first door on the left as you exit the elevator.  We have sent the following medications to your pharmacy for you to pick up at your convenience: Augmentin 2 times a day for 5 days  Repeat colonoscopy for February 2027. Please call 2 months prior to schedule this. A letter will be sent as it gets closer.   Call with any questions or concerns.  Thank you,  Dr. Jackquline Denmark

## 2022-04-14 DIAGNOSIS — M6281 Muscle weakness (generalized): Secondary | ICD-10-CM | POA: Diagnosis not present

## 2022-04-14 DIAGNOSIS — S39012D Strain of muscle, fascia and tendon of lower back, subsequent encounter: Secondary | ICD-10-CM | POA: Diagnosis not present

## 2022-04-17 DIAGNOSIS — M6281 Muscle weakness (generalized): Secondary | ICD-10-CM | POA: Diagnosis not present

## 2022-04-17 DIAGNOSIS — S39012D Strain of muscle, fascia and tendon of lower back, subsequent encounter: Secondary | ICD-10-CM | POA: Diagnosis not present

## 2022-04-21 DIAGNOSIS — M6281 Muscle weakness (generalized): Secondary | ICD-10-CM | POA: Diagnosis not present

## 2022-04-21 DIAGNOSIS — S39012D Strain of muscle, fascia and tendon of lower back, subsequent encounter: Secondary | ICD-10-CM | POA: Diagnosis not present

## 2022-04-23 ENCOUNTER — Ambulatory Visit (INDEPENDENT_AMBULATORY_CARE_PROVIDER_SITE_OTHER): Payer: Medicare Other | Admitting: Student

## 2022-04-23 ENCOUNTER — Encounter: Payer: Self-pay | Admitting: Student

## 2022-04-23 VITALS — BP 128/76 | HR 62 | Ht 64.0 in | Wt 202.6 lb

## 2022-04-23 DIAGNOSIS — M154 Erosive (osteo)arthritis: Secondary | ICD-10-CM | POA: Insufficient documentation

## 2022-04-23 DIAGNOSIS — S39012D Strain of muscle, fascia and tendon of lower back, subsequent encounter: Secondary | ICD-10-CM | POA: Diagnosis not present

## 2022-04-23 DIAGNOSIS — M6281 Muscle weakness (generalized): Secondary | ICD-10-CM | POA: Diagnosis not present

## 2022-04-23 DIAGNOSIS — E1122 Type 2 diabetes mellitus with diabetic chronic kidney disease: Secondary | ICD-10-CM

## 2022-04-23 DIAGNOSIS — M1990 Unspecified osteoarthritis, unspecified site: Secondary | ICD-10-CM | POA: Diagnosis not present

## 2022-04-23 DIAGNOSIS — E119 Type 2 diabetes mellitus without complications: Secondary | ICD-10-CM | POA: Insufficient documentation

## 2022-04-23 DIAGNOSIS — I10 Essential (primary) hypertension: Secondary | ICD-10-CM

## 2022-04-23 DIAGNOSIS — M069 Rheumatoid arthritis, unspecified: Secondary | ICD-10-CM

## 2022-04-23 DIAGNOSIS — Z79899 Other long term (current) drug therapy: Secondary | ICD-10-CM

## 2022-04-23 DIAGNOSIS — E785 Hyperlipidemia, unspecified: Secondary | ICD-10-CM | POA: Diagnosis not present

## 2022-04-23 LAB — POCT GLYCOSYLATED HEMOGLOBIN (HGB A1C): HbA1c, POC (controlled diabetic range): 7 % (ref 0.0–7.0)

## 2022-04-23 MED ORDER — PRAVASTATIN SODIUM 20 MG PO TABS: 20.0000 mg | ORAL_TABLET | Freq: Every day | ORAL | 3 refills | Status: DC

## 2022-04-23 NOTE — Patient Instructions (Signed)
It was great to see you today!   Today we addressed: High cholesterol: I am starting you on a medication called Pravastatin that you can take at night. If you are unable to tolerate this medication daily, try taking it every other day.  Diabetes: your A1c is 7.0 today which is good. Look into a medication called Jardiance - this is a much better medication for diabetes with less risk for low blood sugar and gives benefit for your heart.  I will put in a referral to rheumatology for your rheumatoid arthritis. You should her from them within 1-2 weeks, if you do not please give our office a call and let us know.  I would like to see you back in the summer to check your blood work.   You should return to our clinic Return in about 6 months (around 10/22/2022).  Please arrive 15 minutes before your appointment to ensure smooth check in process.    Please call the clinic at [phone] if your symptoms worsen or you have any concerns.  Thank you for allowing me to participate in your care, Dr. Darci Current [facility]

## 2022-04-23 NOTE — Assessment & Plan Note (Signed)
BP stable in the office -Continue lisinopril 40 mg -Check BMP at next visit

## 2022-04-23 NOTE — Assessment & Plan Note (Signed)
Most recent labs 6 months ago triglycerides >200.  Discussed adding statin for cardiac and stroke risk reduction. -Prescribed pravastatin 20 mg daily at bedtime, discussed with patient if she begins to have muscle aches to try taking medication every other day -If she is able to tolerate statin consider discontinuing fenofibrate

## 2022-04-23 NOTE — Assessment & Plan Note (Signed)
Will continue glipizide 2.5 mg.  Encourage patient to research SGLT2 inhibitors.  -Recheck A1c/BMP at next visit -Continue discussions about discontinuing sulfonylurea and starting SGLT2

## 2022-04-23 NOTE — Progress Notes (Signed)
    SUBJECTIVE:   CHIEF COMPLAINT / HPI:   Brooke Castillo is a 77 y.o. female  presenting to establish care as a new patient.   T2DM:  Meds: Glipizide 2.5 mg; was previously on metformin but was unable to tolerate due to GI side effects.  She previously had been on 5 mg glipizide but was unable to tolerate due to low blood sugars.  She is hesitant to try new diabetes medication like an SGLT2.  Home sugars: Fasting 140-160s Eye Exam: UTD Last A1C: 6.2 six months ago  HLD: Is taking fenofibrate 160 mg daily.  Last triglyceride level in the 200s.  Strong family history CAD - father had 5 coronary bypasses and massive stroke.  Hypertension: Has been stable on lisinopril 40 mg with no reported side effects  Rheumatoid arthritis: Has been trying conservative measures with Tylenol and Voltaren gel.  She reports struggling with continued pain worse in the mornings in gets better throughout the day, however she does not have full function of her hands.  She is interested in having a rheumatology referral.  S/p hysterectomy with estrogen use: She has been on estrogen for many years after hysterectomy at 77 years old for night sweats.  She reports taking half of the pill every other night and is interested in weaning off.   PERTINENT  PMH / PSH: HTN, Asthma, GERD, T2DM, arthritis  OBJECTIVE:   BP 128/76   Pulse 62   Ht '5\' 4"'$  (1.626 m)   Wt 202 lb 9.6 oz (91.9 kg)   SpO2 99%   BMI 34.78 kg/m   Well-appearing, no acute distress Cardio: Regular rate, regular rhythm, no murmurs on exam. Pulm: Clear, no wheezing, no crackles. No increased work of breathing Abdominal: bowel sounds present, soft, non-tender, non-distended Extremities: no peripheral edema, swollen PIP/MIP joints bilateral hands with reduced grip strength bilaterally  ASSESSMENT/PLAN:   DM2 (diabetes mellitus, type 2) ([place]) Will continue glipizide 2.5 mg.  Encourage patient to research SGLT2 inhibitors.  -Recheck A1c/BMP at  next visit -Continue discussions about discontinuing sulfonylurea and starting SGLT2  HLD (hyperlipidemia) Most recent labs 6 months ago triglycerides >200.  Discussed adding statin for cardiac and stroke risk reduction. -Prescribed pravastatin 20 mg daily at bedtime, discussed with patient if she begins to have muscle aches to try taking medication every other day -If she is able to tolerate statin consider discontinuing fenofibrate  HTN (hypertension) BP stable in the office -Continue lisinopril 40 mg -Check BMP at next visit  Arthritis Patient has failed conservative management and is interested in a rheumatology referral. -Referral sent to rheumatology  Current use of estrogen therapy Discussed with patient the risk of continuing to use estrogen.  She is ready to begin weaning off. -Currently she is taking 0.5 mg every other day, she will begin spacing out her estrogen use to eventually wean off completely     Darci Current, [place]

## 2022-04-23 NOTE — Assessment & Plan Note (Signed)
Discussed with patient the risk of continuing to use estrogen.  She is ready to begin weaning off. -Currently she is taking 0.5 mg every other day, she will begin spacing out her estrogen use to eventually wean off completely

## 2022-04-23 NOTE — Assessment & Plan Note (Signed)
Patient has failed conservative management and is interested in a rheumatology referral. -Referral sent to rheumatology

## 2022-05-05 DIAGNOSIS — M1612 Unilateral primary osteoarthritis, left hip: Secondary | ICD-10-CM | POA: Diagnosis not present

## 2022-05-22 ENCOUNTER — Other Ambulatory Visit: Payer: Self-pay | Admitting: Student

## 2022-05-22 NOTE — Telephone Encounter (Signed)
Patient came in stating that she needs a refill on her Furosemide, she only has 2 left. Also asking if possible to send it in for 90 day supply. Stated that she went on mychart to try to request the refills, but it wouldn't let her with several of her medications.

## 2022-05-22 NOTE — Telephone Encounter (Signed)
Patient calls nurse line regarding prescription refill.   She is going out of town tomorrow morning. She states that she will take her last pill on Saturday.   States that she has been trying to get med refilled since Tuesday. Advised that we just received request today.   Will forward to PCP.   Talbot Grumbling, RN

## 2022-05-25 MED ORDER — FUROSEMIDE 40 MG PO TABS: ORAL_TABLET | ORAL | 0 refills | Status: DC

## 2022-06-10 ENCOUNTER — Other Ambulatory Visit: Payer: Self-pay

## 2022-06-10 MED ORDER — CONTOUR TEST VI STRP: ORAL_STRIP | 3 refills | Status: DC

## 2022-06-10 NOTE — Telephone Encounter (Signed)
Patient calls nurse line requesting prescription for Contour test strips. Requesting 90 day supply to be sent to [facility].   Order pended in this encounter.   Talbot Grumbling, RN

## 2022-07-14 ENCOUNTER — Telehealth: Payer: Self-pay | Admitting: Student

## 2022-07-14 NOTE — Telephone Encounter (Signed)
Contacted Tonie Griffith to schedule their annual wellness visit. Appointment made for 07/25/2022.  Thank you,  [facility] Support [facility] Direct dial  [phone]

## 2022-07-21 DIAGNOSIS — L814 Other melanin hyperpigmentation: Secondary | ICD-10-CM | POA: Diagnosis not present

## 2022-07-21 DIAGNOSIS — L570 Actinic keratosis: Secondary | ICD-10-CM | POA: Diagnosis not present

## 2022-07-21 DIAGNOSIS — L820 Inflamed seborrheic keratosis: Secondary | ICD-10-CM | POA: Diagnosis not present

## 2022-07-25 ENCOUNTER — Ambulatory Visit (INDEPENDENT_AMBULATORY_CARE_PROVIDER_SITE_OTHER): Payer: Medicare Other

## 2022-07-25 VITALS — Ht 64.0 in | Wt 202.0 lb

## 2022-07-25 DIAGNOSIS — Z0000 Encounter for general adult medical examination without abnormal findings: Secondary | ICD-10-CM | POA: Diagnosis not present

## 2022-07-25 NOTE — Progress Notes (Signed)
Subjective:   Brooke Castillo is a 77 y.o. female who presents for an Initial Medicare Annual Wellness Visit.  I connected with  Brooke Castillo on 07/25/22 by a audio enabled telemedicine application and verified that I am speaking with the correct person using two identifiers.  Patient Location: Home  Provider Location: Home Office  I discussed the limitations of evaluation and management by telemedicine. The patient expressed understanding and agreed to proceed.  Review of Systems     Cardiac Risk Factors include: diabetes mellitus;hypertension;advanced age (>20men, >53 women);dyslipidemia     Objective:    Today's Vitals   07/25/22 0904  Weight: 202 lb (91.6 kg)  Height: 5\' 4"  (1.626 m)   Body mass index is 34.67 kg/m.     07/25/2022    5:15 PM 04/23/2022    8:51 AM 05/29/2016    3:53 PM 07/21/2013    2:14 PM  Advanced Directives  Does Patient Have a Medical Advance Directive? No Yes No Patient has advance directive, copy in chart  Type of Advance Directive    Living will  Does patient want to make changes to medical advance directive?    No change requested  Would patient like information on creating a medical advance directive? Yes (MAU/Ambulatory/Procedural Areas - Information given)  No - Patient declined     Current Medications (verified) Outpatient Encounter Medications as of 07/25/2022  Medication Sig   acetaminophen (TYLENOL) 500 MG tablet Take 500 mg by mouth every 8 (eight) hours as needed (for pain).    albuterol (PROVENTIL HFA;VENTOLIN HFA) 108 (90 BASE) MCG/ACT inhaler Inhale 1-2 puffs into the lungs every 4 (four) hours as needed for wheezing or shortness of breath.   aspirin EC 81 MG tablet Take 81 mg by mouth daily.   cholecalciferol (VITAMIN D) 1000 units tablet Take 1,000 Units by mouth daily.   EPINEPHrine 0.3 mg/0.3 mL IJ SOAJ injection Use as directed for life-threatening allergic reaction. (Patient taking differently: Inject 0.3 mg into the muscle  once as needed (for life-threatening allergic reaction).)   estradiol (ESTRACE) 1 MG tablet Take 0.5 mg by mouth every other day.   fenofibrate 160 MG tablet Take 160 mg by mouth daily.   fexofenadine (ALLEGRA) 180 MG tablet Take 180 mg by mouth every other day.    furosemide (LASIX) 40 MG tablet TAKE 1 TABLET(40 MG) BY MOUTH DAILY   glipiZIDE (GLUCOTROL XL) 2.5 MG 24 hr tablet Take 2.5 mg by mouth daily with breakfast.   glucose blood (CONTOUR TEST) test strip Please use to check blood sugar levels up to three times daily.   lisinopril (PRINIVIL,ZESTRIL) 40 MG tablet Take 40 mg by mouth daily.   meclizine (ANTIVERT) 12.5 MG tablet Take 12.5 mg by mouth 3 (three) times daily as needed for dizziness.    pantoprazole (PROTONIX) 40 MG tablet Take 40 mg by mouth daily.   pravastatin (PRAVACHOL) 20 MG tablet Take 1 tablet (20 mg total) by mouth at bedtime.   ezetimibe (ZETIA) 10 MG tablet Take by mouth. (Patient not taking: Reported on 07/25/2022)   No facility-administered encounter medications on file as of 07/25/2022.    Allergies (verified) Ciprofloxacin, Codeine, Diflucan [fluconazole], Niaspan [niacin er], Statins, and Flagyl [metronidazole]   History: Past Medical History:  Diagnosis Date   Allergy    Arthritis    Asthma    Complication of anesthesia    IN 2011 I HAD COLON SURGERY & IT TOOK ME 2 DAYS TO WAKE UP  Diverticulitis    s/p partial colectomy   Diverticulosis    DM2 (diabetes mellitus, type 2) (HCC)    A1c in 12/16 was 6.6   GERD (gastroesophageal reflux disease)    History of echocardiogram    a. Echo 2/17 ([facility]):  EF 55-60%   History of nuclear stress test    a. Myoview 5/15: no ischemia, EF 67%  //  b. Myoview 3/17: EF 63%, septal defect c/w artifact, no ischemia; Low Risk   History of renal failure 2004   accidently took too much aleve   HLD (hyperlipidemia)    Hx of colonic polyps    Hymenoptera allergy    Hypertension    Hypertension    LBBB  (left bundle branch block)    transient   Obesity    Rheumatic fever    as a child   Past Surgical History:  Procedure Laterality Date   ABDOMINAL HYSTERECTOMY  1980   complete   APPENDECTOMY  1980   CHOLECYSTECTOMY  1989   COLON SURGERY  2011   removed 11 inches of colon   COLONOSCOPY  08/07/2008   Moderate sigmoid divertiuclosis. Small internal hemorrhoids. Otherwise normal colonoscopy   COLONOSCOPY     around 2017 with Dr Noe Gens at [facility]   KNEE SURGERY Bilateral 2009   SINOSCOPY     TONSILLECTOMY     Family History  Problem Relation Age of Onset   Heart failure Mother    COPD Mother    Hypertension Mother    Coronary artery disease Father 71       CABGx5; brain stem stroke   Colon polyps Father    Hypertension Sister    Diabetes Sister    Breast cancer Sister    CAD Brother 39       stent   Bone cancer Brother    Prostate cancer Brother    Aneurysm Paternal Grandmother    Heart disease Paternal Grandfather    Colon cancer Neg Hx    Esophageal cancer Neg Hx    Stomach cancer Neg Hx    Rectal cancer Neg Hx    Social History   Socioeconomic History   Marital status: Widowed    Spouse name: Not on file   Number of children: 2   Years of education: Not on file   Highest education level: Not on file  Occupational History   Occupation: Retired   Tobacco Use   Smoking status: Never   Smokeless tobacco: Never  Vaping Use   Vaping Use: Never used  Substance and Sexual Activity   Alcohol use: No   Drug use: No   Sexual activity: Not on file  Other Topics Concern   Not on file  Social History Narrative   Lives with son, Brooke Castillo, and 2 dogs   Reports son, Brooke Castillo, will be the one to make medical decisions for her if she was unable to do so   Social Determinants of Health   Financial Resource Strain: Low Risk  (07/25/2022)   Overall Financial Resource Strain (CARDIA)    Difficulty of Paying Living Expenses: Not hard at all  Food Insecurity: No  Food Insecurity (07/25/2022)   Hunger Vital Sign    Worried About Running Out of Food in the Last Year: Never true    Ran Out of Food in the Last Year: Never true  Transportation Needs: No Transportation Needs (07/25/2022)   PRAPARE - Transportation    Lack of Transportation (  Medical): No    Lack of Transportation (Non-Medical): No  Physical Activity: Insufficiently Active (07/25/2022)   Exercise Vital Sign    Days of Exercise per Week: 3 days    Minutes of Exercise per Session: 30 min  Stress: No Stress Concern Present (07/25/2022)   [employer] of Occupational Health - Occupational Stress Questionnaire    Feeling of Stress : Not at all  Social Connections: Moderately Integrated (07/25/2022)   Social Connection and Isolation Panel [NHANES]    Frequency of Communication with Friends and Family: More than three times a week    Frequency of Social Gatherings with Friends and Family: More than three times a week    Attends Religious Services: More than 4 times per year    Active Member of [employer] or Organizations: Yes    Attends Banker Meetings: More than 4 times per year    Marital Status: Widowed    Tobacco Counseling Counseling given: Not Answered   Clinical Intake:  Pre-visit preparation completed: Yes  Pain : No/denies pain  Diabetes: Yes CBG done?: Yes (pt reported as 165) CBG resulted in Enter/ Edit results?: No Did pt. bring in CBG monitor from home?: No  How often do you need to have someone help you when you read instructions, pamphlets, or other written materials from your doctor or pharmacy?: 1 - Never  Diabetic?Yes   Nutrition Risk Assessment:  Has the patient had any N/V/D within the last 2 months?  No  Does the patient have any non-healing wounds?  No  Has the patient had any unintentional weight loss or weight gain?  No   Diabetes:  Is the patient diabetic?  Yes  If diabetic, was a CBG obtained today?  No  Did the patient bring in their  glucometer from home?  No  How often do you monitor your CBG's? As needed .   Financial Strains and Diabetes Management:  Are you having any financial strains with the device, your supplies or your medication? No .  Does the patient want to be seen by Chronic Care Management for management of their diabetes?  No  Would the patient like to be referred to a Nutritionist or for Diabetic Management?  No   Diabetic Exams:  Diabetic Eye Exam: Completed 09/26/21 Diabetic Foot Exam: Overdue, Pt has been advised about the importance in completing this exam. Pt is scheduled for diabetic foot exam on at next office visit.   Interpreter Needed?: No  Information entered by :: Kandis Fantasia LPN   Activities of Daily Living    07/25/2022    5:14 PM  In your present state of health, do you have any difficulty performing the following activities:  Hearing? 0  Vision? 0  Difficulty concentrating or making decisions? 0  Walking or climbing stairs? 0  Dressing or bathing? 0  Doing errands, shopping? 0  Preparing Food and eating ? N  Using the Toilet? N  In the past six months, have you accidently leaked urine? N  Do you have problems with loss of bowel control? N  Managing your Medications? N  Managing your Finances? N  Housekeeping or managing your Housekeeping? N    Patient Care Team: Glendale Chard, DO as PCP - General (Family Medicine) Marcille Buffy (Optometry) Currence, Vladimir Crofts, PA-C (Orthopedic Surgery)  Indicate any recent Medical Services you may have received from other than [facility] providers in the past year (date may be approximate).     Assessment:  This is a routine wellness examination for Dahlia.  Hearing/Vision screen Hearing Screening - Comments:: Denies hearing difficulties   Vision Screening - Comments:: Wears rx glasses - up to date with routine eye exams with [facility])    Dietary issues and exercise activities discussed: Current Exercise  Habits: Home exercise routine, Type of exercise: walking, Time (Minutes): 30, Frequency (Times/Week): 3, Weekly Exercise (Minutes/Week): 90, Intensity: Mild   Goals Addressed             This Visit's Progress    Remain active and independent        Depression Screen    07/25/2022    4:55 PM 04/23/2022    8:50 AM  PHQ 2/9 Scores  PHQ - 2 Score 0 0  PHQ- 9 Score  2    Fall Risk    07/25/2022    5:14 PM 04/23/2022    8:50 AM  Fall Risk   Falls in the past year? 0 0  Number falls in past yr: 0 0  Injury with Fall? 0 0  Risk for fall due to : No Fall Risks   Follow up Falls prevention discussed;Education provided;Falls evaluation completed     FALL RISK PREVENTION PERTAINING TO THE HOME:  Any stairs in or around the home? No  If so, are there any without handrails? No  Home free of loose throw rugs in walkways, pet beds, electrical cords, etc? Yes  Adequate lighting in your home to reduce risk of falls? Yes   ASSISTIVE DEVICES UTILIZED TO PREVENT FALLS:  Life alert? No  Use of a cane, walker or w/c? No  Grab bars in the bathroom? Yes  Shower chair or bench in shower? No  Elevated toilet seat or a handicapped toilet? Yes   TIMED UP AND GO:  Was the test performed? No . Telephonic visit   Cognitive Function:        07/25/2022    5:15 PM  6CIT Screen  What Year? 0 points  What month? 0 points  What time? 0 points  Count back from 20 0 points  Months in reverse 0 points  Repeat phrase 0 points  Total Score 0 points    Immunizations Immunization History  Administered Date(s) Administered   Fluad Quad(high Dose 65+) 01/15/2022   Influenza, High Dose Seasonal PF 01/13/2014   Moderna Sars-Covid-2 Vaccination 05/27/2019, 06/24/2019, 01/24/2020, 07/26/2020   Pneumococcal Conjugate-13 01/03/2016   Pneumococcal Polysaccharide-23 09/07/2007, 09/12/2008, 08/24/2017   Rsv, Bivalent, Protein Subunit Rsvpref,pf Verdis Frederickson) 01/23/2022   Tdap 04/20/2018   Zoster  Recombinat (Shingrix) 10/04/2021, 01/23/2022   Zoster, Live 05/27/2010    TDAP status: Up to date  Flu Vaccine status: Up to date  Pneumococcal vaccine status: Up to date  Covid-19 vaccine status: Information provided on how to obtain vaccines.   Qualifies for Shingles Vaccine? Yes   Zostavax completed Yes   Shingrix Completed?: Yes  Screening Tests Health Maintenance  Topic Date Due   FOOT EXAM  Never done   OPHTHALMOLOGY EXAM  Never done   Diabetic kidney evaluation - Urine ACR  Never done   COVID-19 Vaccine (5 - 2023-24 season) 11/15/2021   INFLUENZA VACCINE  10/16/2022   HEMOGLOBIN A1C  10/22/2022   Diabetic kidney evaluation - eGFR measurement  04/10/2023   MAMMOGRAM  05/21/2023   Medicare Annual Wellness (AWV)  07/25/2023   COLONOSCOPY (Pts 45-49yrs Insurance coverage will need to be confirmed)  05/14/2025   DTaP/Tdap/Td (2 - Td or  Tdap) 04/20/2028   Pneumonia Vaccine 54+ Years old  Completed   DEXA SCAN  Completed   Hepatitis C Screening  Completed   Zoster Vaccines- Shingrix  Completed   HPV VACCINES  Aged Out    Health Maintenance  Health Maintenance Due  Topic Date Due   FOOT EXAM  Never done   OPHTHALMOLOGY EXAM  Never done   Diabetic kidney evaluation - Urine ACR  Never done   COVID-19 Vaccine (5 - 2023-24 season) 11/15/2021    Colorectal cancer screening: Type of screening: Colonoscopy. Completed 05/14/18. Repeat every 7 years  Mammogram status: Completed 05/20/21. Repeat every year  Bone Density status: Completed 09/20/21. Results reflect: Bone density results: NORMAL. Repeat every 5 years.  Lung Cancer Screening: (Low Dose CT Chest recommended if Age 73-80 years, 30 pack-year currently smoking OR have quit w/in 15years.) does not qualify.   Lung Cancer Screening Referral: n/a  Additional Screening:  Hepatitis C Screening: does qualify; Completed 10/09/16  Vision Screening: Recommended annual ophthalmology exams for early detection of glaucoma and  other disorders of the eye. Is the patient up to date with their annual eye exam?  Yes  Who is the provider or what is the name of the office in which the patient attends annual eye exams? [facility]  If pt is not established with a provider, would they like to be referred to a provider to establish care? No .   Dental Screening: Recommended annual dental exams for proper oral hygiene  Community Resource Referral / Chronic Care Management: CRR required this visit?  No   CCM required this visit?  No      Plan:     I have personally reviewed and noted the following in the patient's chart:   Medical and social history Use of alcohol, tobacco or illicit drugs  Current medications and supplements including opioid prescriptions. Patient is not currently taking opioid prescriptions. Functional ability and status Nutritional status Physical activity Advanced directives List of other physicians Hospitalizations, surgeries, and ER visits in previous 12 months Vitals Screenings to include cognitive, depression, and falls Referrals and appointments  In addition, I have reviewed and discussed with patient certain preventive protocols, quality metrics, and best practice recommendations. A written personalized care plan for preventive services as well as general preventive health recommendations were provided to patient.     Durwin Nora, [place]   06/23/8117   Due to this being a virtual visit, the after visit summary with patients personalized plan was offered to patient via mail or my-chart.  per request, patient was mailed a copy of AVS.  Nurse Notes: No concerns; would like to know when she will be due for next office visit and labs.

## 2022-07-25 NOTE — Patient Instructions (Signed)
Brooke Castillo , Thank you for taking time to come for your Medicare Wellness Visit. I appreciate your ongoing commitment to your health goals. Please review the following plan we discussed and let me know if I can assist you in the future.   These are the goals we discussed:  Goals      Remain active and independent        This is a list of the screening recommended for you and due dates:  Health Maintenance  Topic Date Due   Complete foot exam   Never done   Eye exam for diabetics  Never done   Yearly kidney health urinalysis for diabetes  Never done   COVID-19 Vaccine (5 - 2023-24 season) 11/15/2021   Flu Shot  10/16/2022   Hemoglobin A1C  10/22/2022   Yearly kidney function blood test for diabetes  04/10/2023   Mammogram  05/21/2023   Medicare Annual Wellness Visit  07/25/2023   Colon Cancer Screening  05/14/2025   DTaP/Tdap/Td vaccine (2 - Td or Tdap) 04/20/2028   Pneumonia Vaccine  Completed   DEXA scan (bone density measurement)  Completed   Hepatitis C Screening: USPSTF Recommendation to screen - Ages 38-79 yo.  Completed   Zoster (Shingles) Vaccine  Completed   HPV Vaccine  Aged Out    Advanced directives: Information on Advanced Care Planning can be found at [facility] [place] [facility] Advance Health Care Directives Advance Health Care Directives ([email or web address]    Conditions/risks identified: Aim for 30 minutes of exercise or brisk walking, 6-8 glasses of water, and 5 servings of fruits and vegetables each day.   Next appointment: Follow up in one year for your annual wellness visit    Preventive Care 65 Years and Older, Female Preventive care refers to lifestyle choices and visits with your health care provider that can promote health and wellness. What does preventive care include? A yearly physical exam. This is also called an annual well check. Dental exams once or twice a year. Routine eye exams. Ask your health care provider how often you should have your eyes  checked. Personal lifestyle choices, including: Daily care of your teeth and gums. Regular physical activity. Eating a healthy diet. Avoiding tobacco and drug use. Limiting alcohol use. Practicing safe sex. Taking low-dose aspirin every day. Taking vitamin and mineral supplements as recommended by your health care provider. What happens during an annual well check? The services and screenings done by your health care provider during your annual well check will depend on your age, overall health, lifestyle risk factors, and family history of disease. Counseling  Your health care provider may ask you questions about your: Alcohol use. Tobacco use. Drug use. Emotional well-being. Home and relationship well-being. Sexual activity. Eating habits. History of falls. Memory and ability to understand (cognition). Work and work Astronomer. Reproductive health. Screening  You may have the following tests or measurements: Height, weight, and BMI. Blood pressure. Lipid and cholesterol levels. These may be checked every 5 years, or more frequently if you are over 9 years old. Skin check. Lung cancer screening. You may have this screening every year starting at age 18 if you have a 30-pack-year history of smoking and currently smoke or have quit within the past 15 years. Fecal occult blood test (FOBT) of the stool. You may have this test every year starting at age 46. Flexible sigmoidoscopy or colonoscopy. You may have a sigmoidoscopy every 5 years or a colonoscopy every 10 years starting at  age 3. Hepatitis C blood test. Hepatitis B blood test. Sexually transmitted disease (STD) testing. Diabetes screening. This is done by checking your blood sugar (glucose) after you have not eaten for a while (fasting). You may have this done every 1-3 years. Bone density scan. This is done to screen for osteoporosis. You may have this done starting at age 50. Mammogram. This may be done every 1-2  years. Talk to your health care provider about how often you should have regular mammograms. Talk with your health care provider about your test results, treatment options, and if necessary, the need for more tests. Vaccines  Your health care provider may recommend certain vaccines, such as: Influenza vaccine. This is recommended every year. Tetanus, diphtheria, and acellular pertussis (Tdap, Td) vaccine. You may need a Td booster every 10 years. Zoster vaccine. You may need this after age 50. Pneumococcal 13-valent conjugate (PCV13) vaccine. One dose is recommended after age 51. Pneumococcal polysaccharide (PPSV23) vaccine. One dose is recommended after age 17. Talk to your health care provider about which screenings and vaccines you need and how often you need them. This information is not intended to replace advice given to you by your health care provider. Make sure you discuss any questions you have with your health care provider. Document Released: 03/30/2015 Document Revised: 11/21/2015 Document Reviewed: 01/02/2015 Elsevier Interactive Patient Education  2017 [employer] Prevention in the Home Falls can cause injuries. They can happen to people of all ages. There are many things you can do to make your home safe and to help prevent falls. What can I do on the outside of my home? Regularly fix the edges of walkways and driveways and fix any cracks. Remove anything that might make you trip as you walk through a door, such as a raised step or threshold. Trim any bushes or trees on the path to your home. Use bright outdoor lighting. Clear any walking paths of anything that might make someone trip, such as rocks or tools. Regularly check to see if handrails are loose or broken. Make sure that both sides of any steps have handrails. Any raised decks and porches should have guardrails on the edges. Have any leaves, snow, or ice cleared regularly. Use sand or salt on walking paths  during winter. Clean up any spills in your garage right away. This includes oil or grease spills. What can I do in the bathroom? Use night lights. Install grab bars by the toilet and in the tub and shower. Do not use towel bars as grab bars. Use non-skid mats or decals in the tub or shower. If you need to sit down in the shower, use a plastic, non-slip stool. Keep the floor dry. Clean up any water that spills on the floor as soon as it happens. Remove soap buildup in the tub or shower regularly. Attach bath mats securely with double-sided non-slip rug tape. Do not have throw rugs and other things on the floor that can make you trip. What can I do in the bedroom? Use night lights. Make sure that you have a light by your bed that is easy to reach. Do not use any sheets or blankets that are too big for your bed. They should not hang down onto the floor. Have a firm chair that has side arms. You can use this for support while you get dressed. Do not have throw rugs and other things on the floor that can make you trip. What can I  do in the kitchen? Clean up any spills right away. Avoid walking on wet floors. Keep items that you use a lot in easy-to-reach places. If you need to reach something above you, use a strong step stool that has a grab bar. Keep electrical cords out of the way. Do not use floor polish or wax that makes floors slippery. If you must use wax, use non-skid floor wax. Do not have throw rugs and other things on the floor that can make you trip. What can I do with my stairs? Do not leave any items on the stairs. Make sure that there are handrails on both sides of the stairs and use them. Fix handrails that are broken or loose. Make sure that handrails are as long as the stairways. Check any carpeting to make sure that it is firmly attached to the stairs. Fix any carpet that is loose or worn. Avoid having throw rugs at the top or bottom of the stairs. If you do have throw  rugs, attach them to the floor with carpet tape. Make sure that you have a light switch at the top of the stairs and the bottom of the stairs. If you do not have them, ask someone to add them for you. What else can I do to help prevent falls? Wear shoes that: Do not have high heels. Have rubber bottoms. Are comfortable and fit you well. Are closed at the toe. Do not wear sandals. If you use a stepladder: Make sure that it is fully opened. Do not climb a closed stepladder. Make sure that both sides of the stepladder are locked into place. Ask someone to hold it for you, if possible. Clearly mark and make sure that you can see: Any grab bars or handrails. First and last steps. Where the edge of each step is. Use tools that help you move around (mobility aids) if they are needed. These include: Canes. Walkers. Scooters. Crutches. Turn on the lights when you go into a dark area. Replace any light bulbs as soon as they burn out. Set up your furniture so you have a clear path. Avoid moving your furniture around. If any of your floors are uneven, fix them. If there are any pets around you, be aware of where they are. Review your medicines with your doctor. Some medicines can make you feel dizzy. This can increase your chance of falling. Ask your doctor what other things that you can do to help prevent falls. This information is not intended to replace advice given to you by your health care provider. Make sure you discuss any questions you have with your health care provider. Document Released: 12/28/2008 Document Revised: 08/09/2015 Document Reviewed: 04/07/2014 Elsevier Interactive Patient Education  2017 [employer].

## 2022-08-28 ENCOUNTER — Ambulatory Visit (INDEPENDENT_AMBULATORY_CARE_PROVIDER_SITE_OTHER): Payer: Medicare Other

## 2022-08-28 ENCOUNTER — Ambulatory Visit: Payer: Medicare Other

## 2022-08-28 ENCOUNTER — Ambulatory Visit: Payer: Medicare Other | Attending: Internal Medicine | Admitting: Internal Medicine

## 2022-08-28 ENCOUNTER — Encounter: Payer: Self-pay | Admitting: Internal Medicine

## 2022-08-28 VITALS — BP 115/75 | HR 58 | Resp 12 | Ht 62.0 in | Wt 205.0 lb

## 2022-08-28 DIAGNOSIS — M1990 Unspecified osteoarthritis, unspecified site: Secondary | ICD-10-CM

## 2022-08-28 DIAGNOSIS — M79641 Pain in right hand: Secondary | ICD-10-CM

## 2022-08-28 DIAGNOSIS — M25473 Effusion, unspecified ankle: Secondary | ICD-10-CM

## 2022-08-28 DIAGNOSIS — M7989 Other specified soft tissue disorders: Secondary | ICD-10-CM | POA: Diagnosis not present

## 2022-08-28 DIAGNOSIS — M79642 Pain in left hand: Secondary | ICD-10-CM

## 2022-08-28 NOTE — Progress Notes (Signed)
Office Visit Note  Patient: Brooke Castillo             Date of Birth: Oct 22, 1945           MRN: [id number]             PCP: Glendale Chard, DO Referring: Billey Co, MD Visit Date: 08/28/2022  Subjective:  New Patient (Initial Visit) (Patient states she is having pain and swelling in both of her hands. )   History of Present Illness: Brooke Castillo is a 77 y.o. female here for evaluation of joint pain and swelling affecting both hands history of rheumatoid arthritis.  Did not have any clear rheumatology records to review related to this diagnosis and is not on any long-term antirheumatic DMARD medication.  Symptoms are chronic for years but is recently been experiencing additional progression particularly with the amount of swelling occurring in several areas.  She has history of osteoarthritis with previous bilateral knee replacement.  Had previous surgery on her finger for removing a cyst or some other type of nodule.  She does not recall any particular change in use or activity to account for difference in hand arthritis symptoms.  Also associated with prolonged morning stiffness lasting up to 4 hours.  Does not usually see any visible discoloration does not have any change in sensation.  Takes Tylenol and uses topical diclofenac which is partially helpful. Besides the hand swelling also gets some persistent swelling at the feet and ankles.  This is usually worse with prolonged standing or at night.   Activities of Daily Living:  Patient reports morning stiffness for 2-4 hours.   Patient Denies nocturnal pain.  Difficulty dressing/grooming: Denies Difficulty climbing stairs: Reports Difficulty getting out of chair: Denies Difficulty using hands for taps, buttons, cutlery, and/or writing: Reports  Review of Systems  Constitutional:  Positive for fatigue.  HENT:  Positive for mouth sores and mouth dryness.   Eyes:  Positive for dryness.  Respiratory:  Positive for shortness of  breath.   Cardiovascular:  Positive for chest pain. Negative for palpitations.  Gastrointestinal:  Negative for blood in stool, constipation and diarrhea.  Endocrine: Positive for increased urination.  Genitourinary:  Negative for involuntary urination.  Musculoskeletal:  Positive for joint pain, joint pain, joint swelling, myalgias, muscle weakness, morning stiffness, muscle tenderness and myalgias. Negative for gait problem.  Skin:  Positive for hair loss and sensitivity to sunlight. Negative for color change and rash.  Allergic/Immunologic: Negative for susceptible to infections.  Neurological:  Negative for dizziness and headaches.  Hematological:  Negative for swollen glands.  Psychiatric/Behavioral:  Positive for sleep disturbance. Negative for depressed mood. The patient is not nervous/anxious.     PMFS History:  Patient Active Problem List   Diagnosis Date Noted   Leg swelling 09/08/2022   Arthritis 04/23/2022   DM2 (diabetes mellitus, type 2) (HCC) 04/23/2022   Current use of estrogen therapy 04/23/2022   Chest pain 07/20/2013   Dizziness 07/20/2013   HTN (hypertension) 07/20/2013   HLD (hyperlipidemia) 07/20/2013   Obesity 07/20/2013    Past Medical History:  Diagnosis Date   Allergy    Arthritis    Asthma    Complication of anesthesia    IN 2011 I HAD COLON SURGERY & IT TOOK ME 2 DAYS TO WAKE UP    Diverticulitis    s/p partial colectomy   Diverticulosis    DM2 (diabetes mellitus, type 2) (HCC)    A1c in 12/16 was  6.6   GERD (gastroesophageal reflux disease)    History of echocardiogram    a. Echo 2/17 ([facility]):  EF 55-60%   History of nuclear stress test    a. Myoview 5/15: no ischemia, EF 67%  //  b. Myoview 3/17: EF 63%, septal defect c/w artifact, no ischemia; Low Risk   History of renal failure 2004   accidently took too much aleve   HLD (hyperlipidemia)    Hx of colonic polyps    Hymenoptera allergy    Hypertension    Hypertension    LBBB  (left bundle branch block)    transient   Obesity    Rheumatic fever    as a child    Family History  Problem Relation Age of Onset   Heart failure Mother    COPD Mother    Hypertension Mother    Coronary artery disease Father 64       CABGx5; brain stem stroke   Colon polyps Father    Hypertension Sister    Diabetes Sister    Breast cancer Sister    CAD Brother 13       stent   Bone cancer Brother    Prostate cancer Brother    Aneurysm Paternal Grandmother    Heart disease Paternal Grandfather    Colon cancer Neg Hx    Esophageal cancer Neg Hx    Stomach cancer Neg Hx    Rectal cancer Neg Hx    Past Surgical History:  Procedure Laterality Date   ABDOMINAL HYSTERECTOMY  1980   complete   APPENDECTOMY  1980   CHOLECYSTECTOMY  1989   COLON SURGERY  2011   removed 11 inches of colon   COLONOSCOPY  08/07/2008   Moderate sigmoid divertiuclosis. Small internal hemorrhoids. Otherwise normal colonoscopy   COLONOSCOPY     around 2017 with Dr Noe Gens [facility]   KNEE SURGERY Bilateral 2009   SINOSCOPY     TONSILLECTOMY     Social History   Social History Narrative   Lives with son, [name], and 2 dogs   Reports son, [name], will be the one to make medical decisions for her if she was unable to do so   Immunization History  Administered Date(s) Administered   Fluad Quad(high Dose 65+) 01/15/2022   Influenza, High Dose Seasonal PF 01/13/2014   Moderna Sars-Covid-2 Vaccination 05/27/2019, 06/24/2019, 01/24/2020, 07/26/2020   Pneumococcal Conjugate-13 01/03/2016   Pneumococcal Polysaccharide-23 09/07/2007, 09/12/2008, 08/24/2017   Rsv, Bivalent, Protein Subunit Rsvpref,pf Verdis Frederickson) 01/23/2022   Tdap 04/20/2018   Zoster Recombinat (Shingrix) 10/04/2021, 01/23/2022   Zoster, Live 05/27/2010     Objective: Vital Signs: BP 115/75 (BP Location: Right Arm, Patient Position: Sitting, Cuff Size: Normal)   Pulse (!) 58   Resp 12   Ht 5\' 2"  (1.575 m)   Wt 205 lb  (93 kg)   BMI 37.49 kg/m    Physical Exam Constitutional:      Appearance: She is obese.  Cardiovascular:     Rate and Rhythm: Normal rate and regular rhythm.  Pulmonary:     Effort: Pulmonary effort is normal.     Breath sounds: Normal breath sounds.  Musculoskeletal:     Comments: Trace bilateral pedal edema Petechiae on the left ankle and top of left foot  Neurological:     Mental Status: She is alert.  Psychiatric:        Mood and Affect: Mood normal.  Musculoskeletal Exam:  Elbows full ROM no tenderness or swelling Wrists full ROM no tenderness or swelling Fingers with visible swelling of the fourth PIP joint both hands worse on right side, right first interphalangeal joint significantly wider as compared to left hand, Heberden's nodes throughout DIP joints of both hands some spots with lateral deviation Knees full ROM no tenderness or swelling Ankles full ROM no tenderness or swelling Right first MTP bunion   Investigation: No additional findings.  Imaging: XR Hand 2 View Right  Result Date: 09/11/2022 X-ray right hand 2 views Radiocarpal joint space appears normal.  Cystic changes present at the ulnar styloid and in carpal bones with mild to moderate first [facility] joint degenerative changes.  Worse at first IP.  Second third MCP joint space narrowing and small hook osteophyte process.  PIP joint narrowing worse to fourth digit.  DIP joint narrowing with apparent central erosion changes at varying stages and second third and fifth digits. Impression Osteoarthritis generalized throughout the hand with early central erosions at multiple DIPs appears most consistent with erosive osteoarthritis  XR Hand 2 View Left  Result Date: 09/11/2022 X-ray left hand 2 views Radiocarpal joint space appears normal.  There is moderately severe appearing first [facility] joint degenerative changes with joint space loss cysts and subluxation.  Moderate first MCP and IP joint changes.  Osteophytes  and asymmetric joint space loss at multiple PIP and DIP joints and second MCP.  Possible erosion at the third DIP joint.  Otherwise bone mineralization appears normal.  Impression Severe first [facility] joint osteoarthritis moderate to severe throughout the distal finger joints there is probable early erosion of 3rd DIP   Recent Labs: Lab Results  Component Value Date   WBC 6.9 04/09/2022   HGB 14.4 04/09/2022   PLT 285.0 04/09/2022   NA 138 04/09/2022   K 4.2 04/09/2022   CL 98 04/09/2022   CO2 32 04/09/2022   GLUCOSE 154 (H) 04/09/2022   BUN 20 04/09/2022   CREATININE 0.77 04/09/2022   BILITOT 0.4 04/09/2022   ALKPHOS 47 04/09/2022   AST 21 04/09/2022   ALT 16 04/09/2022   PROT 7.5 04/09/2022   ALBUMIN 4.4 04/09/2022   CALCIUM 9.6 04/09/2022   GFRAA >60 05/29/2016    Speciality Comments: No specialty comments available.  Procedures:  No procedures performed Allergies: Celecoxib, Ciprofloxacin, Codeine, Diflucan [fluconazole], Ezetimibe, Niaspan [niacin er], Statins, and Flagyl [metronidazole]   Assessment / Plan:     Visit Diagnoses: Arthritis  Finger swelling - Plan: Rheumatoid factor, Cyclic citrul peptide antibody, IgG, Sedimentation rate, Anti-DNAse B antibody, XR Hand 2 View Right, XR Hand 2 View Left  Has some generalized osteoarthritis but the hand pain and inflammation is the only peripheral joint synovitis appreciable.  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  Also checking lab markers for any evidence of rheumatoid arthritis but hand changes seem more distal predominant than would be expected for this.  Ankle swelling, unspecified laterality  Discussed ankle swelling looks more consistent with mild peripheral edema than coming from the ankle joint itself.  Has mild overlying petechial skin rash probably a very mild amount of stasis dermatitis.  She is prescribed Lasix 40 mg daily discussed  otherwise elevating legs or compression.  Orders: Orders Placed This Encounter  Procedures   XR Hand 2 View Right   XR Hand 2 View Left   Rheumatoid factor   Cyclic citrul peptide antibody, IgG  Sedimentation rate   Anti-DNAse B antibody   No orders of the defined types were placed in this encounter.    Follow-Up Instructions: Return in about 2 weeks (around 09/11/2022) for New pt ?RA f/u 2-3ks.   Fuller Plan, MD  Note - This record has been created using [employer].  Chart creation errors have been sought, but may not always  have been located. Such creation errors do not reflect on  the standard of medical care.

## 2022-09-03 LAB — CYCLIC CITRUL PEPTIDE ANTIBODY, IGG: Cyclic Citrullin Peptide Ab: <16 UNITS

## 2022-09-03 LAB — SEDIMENTATION RATE: Sed Rate: 11 mm/h (ref 0–30)

## 2022-09-03 LAB — RHEUMATOID FACTOR: Rheumatoid fact SerPl-aCnc: <10 IU/mL (ref <14)

## 2022-09-03 LAB — ANTI-DNASE B ANTIBODY: Anti-DNAse-B: <95 U/mL (ref <301)

## 2022-09-08 ENCOUNTER — Ambulatory Visit (INDEPENDENT_AMBULATORY_CARE_PROVIDER_SITE_OTHER): Payer: Medicare Other | Admitting: Family Medicine

## 2022-09-08 ENCOUNTER — Other Ambulatory Visit: Payer: Self-pay

## 2022-09-08 VITALS — BP 165/69 | HR 59 | Ht 63.0 in | Wt 205.6 lb

## 2022-09-08 DIAGNOSIS — M7989 Other specified soft tissue disorders: Secondary | ICD-10-CM

## 2022-09-08 DIAGNOSIS — I10 Essential (primary) hypertension: Secondary | ICD-10-CM | POA: Diagnosis not present

## 2022-09-08 NOTE — Progress Notes (Signed)
    SUBJECTIVE:   CHIEF COMPLAINT / HPI:   Patient states Friday and Saturday noticed right leg was swollen. Veins felt hot. Denies pain. Feels the swelling improved as she did elevate her legs yesterday. Denies recent travel. Denies current chest pain. No history of blood clots  BP elevated. Did take medications today  Lasix 40mg  daily, Lisinopril 40mg  daily   PERTINENT  PMH / PSH: Reviewed   OBJECTIVE:   BP (!) 165/69   Pulse (!) 59   Ht 5\' 3"  (1.6 m)   Wt 205 lb 9.6 oz (93.3 kg)   SpO2 94%   BMI 36.42 kg/m    Physical exam General: well appearing, NAD Cardiovascular: RRR, no murmurs Lungs: CTAB. Normal WOB Abdomen: soft, non-distended, non-tender Skin: warm, dry. No edema of LE or erythema   ASSESSMENT/PLAN:   HTN (hypertension) BP elevated today at 142/63 and higher on repeat check. Current medication: Lasix 40mg  daily, Lisinopril 40mg  daily and states she took both today.  Recommended that she check her blood pressure at home for couple days and bring that in for her follow-up appointment with her PCP  Leg swelling Not visualized today. Physical exam unremarkable without edema, erythema or tenderness. Discussed return precautions if it were to happen again and not improve with leg elevation or she was to have other concerning symptoms associated.     Cora Collum, DO [facility]

## 2022-09-08 NOTE — Assessment & Plan Note (Signed)
BP elevated today at 142/63 and higher on repeat check. Current medication: Lasix 40mg  daily, Lisinopril 40mg  daily and states she took both today.  Recommended that she check her blood pressure at home for couple days and bring that in for her follow-up appointment with her PCP

## 2022-09-08 NOTE — Assessment & Plan Note (Signed)
Not visualized today. Physical exam unremarkable without edema, erythema or tenderness. Discussed return precautions if it were to happen again and not improve with leg elevation or she was to have other concerning symptoms associated.

## 2022-09-08 NOTE — Patient Instructions (Addendum)
It was great seeing you today!  Im glad the swelling has improved. If it continues and you have pain, redness or other concerning symptoms please return   Your blood pressure was elevated today, I recommend checking your blood pressure at home for a few days in the morning and bring those values to your next visit.  Please check-out at the front desk before leaving the clinic. Schedule to see your primary doctor in about 2 weeks for blood pressure follow up, but if you need to be seen earlier than that for any new issues we're happy to fit you in, just give [place] a call!  Feel free to call with any questions or concerns at any time, at [phone].   Take care,  Dr. Cora Collum [facility]

## 2022-09-19 NOTE — Progress Notes (Unsigned)
    SUBJECTIVE:   CHIEF COMPLAINT / HPI:   Hypertension: - Medications: Lasix 40mg  daily, Lisinopril 40mg  daily  - Compliance: Yes - Checking BP at home: Yes - ranging in SBP 140s - Denies any SOB, CP, vision changes, LE edema, medication SEs, or symptoms of hypotension  Diabetes Current Regimen: Glipizide 2.5mg  daily CBGs: Fasting BGL in 150s (see media for details). Denies low BGL Last A1c:  Lab Results  Component Value Date   HGBA1C 6.8 09/22/2022    Denies polyuria, polydipsia, hypoglycemia. Last Eye Exam: DUE - appointment next week ([facility]) Statin: Pravastatin 20mg  daily ACE/ARB: Lisinopril 40mg  daily   Post-menopausal Patient has been tapering off estrogen slowly. States she had worsening mood swings and hot flashes when going below Estradiol 0.5mg  every other day. Would like to hold on any other medication reductions.   PERTINENT  PMH / PSH: Asthma, T2DM, GERD, HLD, HTN  OBJECTIVE:   BP 130/70 (BP Location: Left Arm, Patient Position: Sitting)   Pulse 70   Ht 5\' 3"  (1.6 m)   Wt 204 lb (92.5 kg)   SpO2 97%   BMI 36.14 kg/m    General: Alert, no apparent distress, well groomed HEENT: Normocephalic, atraumatic, moist mucus membranes, neck supple Respiratory: Normal respiratory effort GI: Obese abdomen, non-distended Skin: No rashes, no jaundice Psych: Appropriate mood and affect  ASSESSMENT/PLAN:   DM2 (diabetes mellitus, type 2) (HCC) A1c 6.8, compliant on Glipizide 2.5mg  daily. Fasting BGL in 150s. Discussed switching agents given risk of hypoglycemia, patient amenable. Compliant on statin and ACEi. -Start Ozempic 0.25mg  weekly. Showed patient how to injection medication. Will likely only need low dose given well controlled A1c -STOP taking Glipizide 2.5mg  daily -Keep appointment for diabetic eye exam next week -ACR today  HTN (hypertension) 130/70, controlled today. Compliant on Lasix 40mg  daily, Lisinopril 40mg  daily. BP check at home  140s/70s average but can increase to SBP 160s. -Scheduled for ambulatory blood pressure monitoring with Dr. Raymondo Band given inconsistency between home and office readings. Titrate medication pending results.  Dizziness Reports episodes of dizziness when turning her head and headache near temples bilaterally. Recently GI illness x 5 days, just started eating again this weekend. H/o vertigo, symptoms possibly exacerbated by dehydration. -Monitor for symptom improvement as she recovers from acute illness -Refilled Meclizine at patient request since she likes to have it available for vertigo episodes. Discussed she should not use the medication daily but only for episodic events.  Current use of estrogen therapy Unable to tolerate further wean of estrogen, currently taking Estradiol 0.5mg  every other day. Will continue current therapy.  Dr. Elberta Fortis, DO  [facility]

## 2022-09-22 ENCOUNTER — Ambulatory Visit (INDEPENDENT_AMBULATORY_CARE_PROVIDER_SITE_OTHER): Payer: Medicare Other | Admitting: Family Medicine

## 2022-09-22 ENCOUNTER — Encounter: Payer: Self-pay | Admitting: Family Medicine

## 2022-09-22 ENCOUNTER — Encounter: Payer: Self-pay | Admitting: Internal Medicine

## 2022-09-22 ENCOUNTER — Ambulatory Visit: Payer: Medicare Other | Attending: Internal Medicine | Admitting: Internal Medicine

## 2022-09-22 VITALS — BP 128/76 | HR 59 | Resp 14 | Ht 63.0 in | Wt 204.0 lb

## 2022-09-22 VITALS — BP 130/70 | HR 70 | Ht 63.0 in | Wt 204.0 lb

## 2022-09-22 DIAGNOSIS — I10 Essential (primary) hypertension: Secondary | ICD-10-CM | POA: Diagnosis not present

## 2022-09-22 DIAGNOSIS — M154 Erosive (osteo)arthritis: Secondary | ICD-10-CM

## 2022-09-22 DIAGNOSIS — E119 Type 2 diabetes mellitus without complications: Secondary | ICD-10-CM | POA: Diagnosis not present

## 2022-09-22 DIAGNOSIS — Z79899 Other long term (current) drug therapy: Secondary | ICD-10-CM | POA: Diagnosis not present

## 2022-09-22 DIAGNOSIS — R42 Dizziness and giddiness: Secondary | ICD-10-CM | POA: Diagnosis not present

## 2022-09-22 LAB — POCT GLYCOSYLATED HEMOGLOBIN (HGB A1C): HbA1c, POC (controlled diabetic range): 6.8 % (ref 0.0–7.0)

## 2022-09-22 MED ORDER — MECLIZINE HCL 12.5 MG PO TABS
12.5000 mg | ORAL_TABLET | Freq: Three times a day (TID) | ORAL | 0 refills | Status: DC | PRN
Start: 2022-09-22 — End: 2023-08-24

## 2022-09-22 MED ORDER — SEMAGLUTIDE(0.25 OR 0.5MG/DOS) 2 MG/1.5ML ~~LOC~~ SOPN
0.2500 mg | PEN_INJECTOR | SUBCUTANEOUS | 0 refills | Status: DC
Start: 2022-09-22 — End: 2022-09-23

## 2022-09-22 NOTE — Assessment & Plan Note (Signed)
Reports episodes of dizziness when turning her head and headache near temples bilaterally. Recently GI illness x 5 days, just started eating again this weekend. H/o vertigo, symptoms possibly exacerbated by dehydration. -Monitor for symptom improvement as she recovers from acute illness -Refilled Meclizine at patient request since she likes to have it available for vertigo episodes. Discussed she should not use the medication daily but only for episodic events.

## 2022-09-22 NOTE — Progress Notes (Signed)
Office Visit Note  Patient: Brooke Castillo             Date of Birth: 09/01/1945           MRN: [id number]             PCP: Glendale Chard, DO Referring: Glendale Chard, DO Visit Date: 09/22/2022   Subjective:  Follow-up   History of Present Illness: Brooke Castillo is a 77 y.o. female here for follow up for joint pain and swelling concern previous history of possible rheumatoid arthritis.  Laboratory testing at our initial visit was entirely negative for rheumatoid factor antibodies or systemic inflammatory markers.  X-ray of the hands look most consistent with erosive osteoarthritis of the hand.  Has continued joint pain couple areas especially in the fingers.  Seems more swelling usually around the fourth PIP joint more on some days than others hand pain is usually worse after increased use.  Previous HPI 08/28/22 Brooke Castillo is a 77 y.o. female here for evaluation of joint pain and swelling affecting both hands history of rheumatoid arthritis.  Did not have any clear rheumatology records to review related to this diagnosis and is not on any long-term antirheumatic DMARD medication.  Symptoms are chronic for years but is recently been experiencing additional progression particularly with the amount of swelling occurring in several areas.  She has history of osteoarthritis with previous bilateral knee replacement.  Had previous surgery on her finger for removing a cyst or some other type of nodule.  She does not recall any particular change in use or activity to account for difference in hand arthritis symptoms.  Also associated with prolonged morning stiffness lasting up to 4 hours.  Does not usually see any visible discoloration does not have any change in sensation.  Takes Tylenol and uses topical diclofenac which is partially helpful. Besides the hand swelling also gets some persistent swelling at the feet and ankles.  This is usually worse with prolonged standing or at night.   Review  of Systems  Constitutional:  Positive for fatigue.  HENT:  Negative for mouth sores and mouth dryness.   Eyes:  Positive for dryness.  Respiratory:  Negative for shortness of breath.   Cardiovascular:  Negative for chest pain and palpitations.  Gastrointestinal:  Positive for diarrhea. Negative for blood in stool and constipation.  Endocrine: Negative for increased urination.  Genitourinary:  Negative for involuntary urination.  Musculoskeletal:  Positive for joint pain, gait problem, joint pain, joint swelling, muscle weakness, morning stiffness and muscle tenderness. Negative for myalgias and myalgias.  Skin:  Negative for color change, rash, hair loss and sensitivity to sunlight.  Allergic/Immunologic: Negative for susceptible to infections.  Neurological:  Positive for dizziness and headaches.  Hematological:  Negative for swollen glands.  Psychiatric/Behavioral:  Negative for depressed mood and sleep disturbance. The patient is not nervous/anxious.     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 Echo 2/17 ([facility]):  EF 55-60%   History of nuclear stress test    a. Myoview 5/15: no ischemia, EF 67%  //  b. Myoview 3/17: EF 63%, septal defect c/w artifact, no ischemia; Low Risk   History of renal  failure 2004   accidently took too much aleve   HLD (hyperlipidemia)    Hx of colonic polyps    Hymenoptera allergy    Hypertension    Hypertension    LBBB (left bundle branch block)    transient   Obesity    Rheumatic fever    as a child    Family History  Problem Relation Age of Onset   Heart failure Mother    COPD Mother    Hypertension Mother    Coronary artery disease Father 50       CABGx5; brain stem stroke   Colon polyps Father    Hypertension Sister    Diabetes Sister    Breast cancer Sister    CAD Brother 5       stent   Bone cancer Brother    Prostate cancer Brother    Aneurysm Paternal Grandmother    Heart disease Paternal Grandfather    Colon cancer Neg Hx    Esophageal cancer Neg Hx    Stomach cancer Neg Hx    Rectal cancer Neg Hx    Past Surgical History:  Procedure Laterality Date   ABDOMINAL HYSTERECTOMY  1980   complete   APPENDECTOMY  1980   CHOLECYSTECTOMY  1989   COLON SURGERY  2011   removed 11 inches of colon   COLONOSCOPY  08/07/2008   Moderate sigmoid divertiuclosis. Small internal hemorrhoids. Otherwise normal colonoscopy   COLONOSCOPY     around 2017 with Dr Noe Gens at [facility]   KNEE SURGERY Bilateral 2009   SINOSCOPY     TONSILLECTOMY     Social History   Social History Narrative   Lives with son, [name], and 2 dogs   Reports son, [name], will be the one to make medical decisions for her if she was unable to do so   Immunization History  Administered Date(s) Administered   Fluad Quad(high Dose 65+) 01/15/2022   Influenza, High Dose Seasonal PF 01/13/2014   Moderna Sars-Covid-2 Vaccination 05/27/2019, 06/24/2019, 01/24/2020, 07/26/2020   Pneumococcal Conjugate-13 01/03/2016   Pneumococcal Polysaccharide-23 09/07/2007, 09/12/2008, 08/24/2017   Rsv, Bivalent, Protein Subunit Rsvpref,pf Verdis Frederickson) 01/23/2022   Tdap 04/20/2018   Zoster Recombinant(Shingrix) 10/04/2021, 01/23/2022   Zoster, Live 05/27/2010      Objective: Vital Signs: BP 128/76 (BP Location: Left Arm, Patient Position: Sitting, Cuff Size: Normal)   Pulse (!) 59   Resp 14   Ht 5\' 3"  (1.6 m)   Wt 204 lb (92.5 kg)   BMI 36.14 kg/m    Physical Exam Constitutional:      Appearance: She is obese.  Eyes:     Conjunctiva/sclera: Conjunctivae normal.  Cardiovascular:     Rate and Rhythm: Normal rate and regular rhythm.  Pulmonary:     Effort: Pulmonary effort is normal.     Breath sounds: Normal breath sounds.  Lymphadenopathy:     Cervical: No cervical adenopathy.  Skin:    General: Skin is warm and dry.     Findings: No rash.  Neurological:     Mental Status: She is alert.  Psychiatric:        Mood and Affect: Mood normal.      Musculoskeletal Exam:  Elbows full ROM no tenderness or swelling Wrists full ROM no tenderness or swelling Fingers right 4th PIP swelling without warmth or erythema, mild tenderness Heberden's nodes throughout DIP joints of both hands some spots with lateral deviation Knees full ROM no tenderness or swelling Ankles full ROM no tenderness or swelling Right first MTP bunion  Investigation: No additional findings.  Imaging: No results found.  Recent Labs: Lab Results  Component Value Date   WBC 6.9 04/09/2022   HGB 14.4 04/09/2022   PLT 285.0 04/09/2022   NA 138 04/09/2022   K 4.2 04/09/2022   CL 98 04/09/2022   CO2 32 04/09/2022   GLUCOSE 154 (H) 04/09/2022   BUN 20 04/09/2022   CREATININE 0.77 04/09/2022   BILITOT 0.4 04/09/2022   ALKPHOS 47 04/09/2022   AST 21 04/09/2022   ALT 16 04/09/2022   PROT 7.5 04/09/2022   ALBUMIN 4.4 04/09/2022   CALCIUM 9.6 04/09/2022   GFRAA >60 05/29/2016    Speciality Comments: No specialty comments available.  Procedures:  No procedures performed Allergies: Bee venom, Celecoxib, Ciprofloxacin, Codeine, Diflucan [fluconazole], Ezetimibe, Niaspan [niacin er], Statins, and Flagyl [metronidazole]   Assessment / Plan:     Visit  Diagnoses: Erosive osteoarthritis of both hands  Discussed her findings appear most consistent with erosive osteoarthritis of the hand.  Symptomatic management in line with primary osteoarthritis would be the most common approach.  Discussed possible trial of antirheumatic DMARD medication such as hydroxychloroquine or methotrexate but with very inconsistent data for efficacy.  Additionally discussed use of other oral and topical supplements for osteoarthritis pain relief.  The PIP joint synovitis will be option for intra-articular steroid injection, but recommending against this today as symptoms are trending towards better at present.  Could follow-up again as needed if this starts to progress or flareup again.  Orders: No orders of the defined types were placed in this encounter.  No orders of the defined types were placed in this encounter.    Follow-Up Instructions: Return if symptoms worsen or fail to improve, for For finger joint injection if symptoms increased.   Fuller Plan, MD  Note - This record has been created using [employer].  Chart creation errors have been sought, but may not always  have been located. Such creation errors do not reflect on  the standard of medical care.

## 2022-09-22 NOTE — Patient Instructions (Signed)
It was wonderful to see you today! Thank you for choosing [facility].   Please bring ALL of your medications with you to every visit.   Today we talked about:  Please stop taking your glipizide and start taking the Ozempic weekly. This is an injection you will give yourself. Please follow up with our Pharmacist to have the ambulatory blood pressure monitoring done. We can discuss medication changes based on those results.  Please follow up in 1 month  Call the clinic at [phone] if your symptoms worsen or you have any concerns.  Please be sure to schedule follow up at the front desk before you leave today.   Elberta Fortis, DO Family Medicine

## 2022-09-22 NOTE — Patient Instructions (Addendum)

## 2022-09-22 NOTE — Assessment & Plan Note (Addendum)
130/70, controlled today. Compliant on Lasix 40mg  daily, Lisinopril 40mg  daily. BP check at home 140s/70s average but can increase to SBP 160s. -Scheduled for ambulatory blood pressure monitoring with Dr. Raymondo Band given inconsistency between home and office readings. Titrate medication pending results.

## 2022-09-22 NOTE — Assessment & Plan Note (Addendum)
A1c 6.8, compliant on Glipizide 2.5mg  daily. Fasting BGL in 150s. Discussed switching agents given risk of hypoglycemia, patient amenable. Compliant on statin and ACEi. -Start Ozempic 0.25mg  weekly. Showed patient how to injection medication. Will likely only need low dose given well controlled A1c -STOP taking Glipizide 2.5mg  daily -Keep appointment for diabetic eye exam next week -ACR today

## 2022-09-22 NOTE — Assessment & Plan Note (Signed)
Unable to tolerate further wean of estrogen, currently taking Estradiol 0.5mg  every other day. Will continue current therapy.

## 2022-09-23 ENCOUNTER — Other Ambulatory Visit: Payer: Self-pay | Admitting: Family Medicine

## 2022-09-23 DIAGNOSIS — E119 Type 2 diabetes mellitus without complications: Secondary | ICD-10-CM

## 2022-09-23 LAB — MICROALBUMIN / CREATININE URINE RATIO
Creatinine, Urine: 52.2 mg/dL
Microalb/Creat Ratio: 51 mg/g creat — ABNORMAL HIGH (ref 0–29)
Microalbumin, Urine: 26.7 ug/mL

## 2022-09-23 MED ORDER — SEMAGLUTIDE(0.25 OR 0.5MG/DOS) 2 MG/1.5ML ~~LOC~~ SOPN: 0.2500 mg | PEN_INJECTOR | SUBCUTANEOUS | 0 refills | Status: DC

## 2022-09-25 ENCOUNTER — Telehealth: Payer: Self-pay

## 2022-09-25 NOTE — Telephone Encounter (Signed)
A Prior Authorization was initiated for this patients OZEMPIC through CoverMyMeds.   Key: [id number]

## 2022-09-26 NOTE — Telephone Encounter (Signed)
Pharmacy Patient Advocate Encounter  Received notification from [facility] that Prior Authorization for Ozempic has been APPROVED from 09/26/22 to 09/25/23.Marland Kitchen

## 2022-09-30 DIAGNOSIS — E119 Type 2 diabetes mellitus without complications: Secondary | ICD-10-CM | POA: Diagnosis not present

## 2022-10-07 ENCOUNTER — Encounter: Payer: Self-pay | Admitting: Pharmacist

## 2022-10-07 ENCOUNTER — Ambulatory Visit: Payer: Medicare Other | Admitting: Student

## 2022-10-07 ENCOUNTER — Ambulatory Visit (INDEPENDENT_AMBULATORY_CARE_PROVIDER_SITE_OTHER): Payer: Medicare Other | Admitting: Pharmacist

## 2022-10-07 VITALS — BP 182/80 | Ht 63.4 in | Wt 203.8 lb

## 2022-10-07 DIAGNOSIS — I10 Essential (primary) hypertension: Secondary | ICD-10-CM | POA: Diagnosis not present

## 2022-10-07 NOTE — Patient Instructions (Addendum)
Blood Pressure Activity Diary Time Lying down/ Sleeping Walking/ Exercise Stressed/ Angry Headache/ Pain Dizzy  9 AM       10 AM       11 AM       12 PM       1 PM       2 PM       Time Lying down/ Sleeping Walking/ Exercise Stressed/ Angry Headache/ Pain Dizzy  3 PM       4 PM        5 PM       6 PM       7 PM       8 PM       Time Lying down/ Sleeping Walking/ Exercise Stressed/ Angry Headache/ Pain Dizzy  9 PM       10 PM       11 PM       12 AM       1 AM       2 AM       3 AM       Time Lying down/ Sleeping Walking/ Exercise Stressed/ Angry Headache/ Pain Dizzy  4 AM       5 AM       6 AM       7 AM       8 AM       9 AM       10 AM        Time you woke up: _________                  Time you went to sleep:__________  Come back tomorrow at 8:30 to have the monitor removed Call the Family Medicine Clinic if you have any questions before then ([phone])  Wearing the Blood Pressure Monitor The cuff will inflate every 20 minutes during the day and every 30 minutes while you sleep. Your blood pressure readings will NOT display after cuff inflation Fill out the blood pressure-activity diary during the day, especially during activities that may affect your reading -- such as exercise, stress, walking, taking your blood pressure medications  Important things to know: Avoid taking the monitor off for the next 24 hours, unless it causes you discomfort or pain. Do NOT get the monitor wet and do NOT dry to clean the monitor with any cleaning products. Do NOT put the monitor on anyone else's arm. When the cuff inflates, avoid excess movement. Let the cuffed arm hang loosely, slightly away from the body. Avoid flexing the muscles or moving the hand/fingers. When you go to sleep, make sure that the hose is not kinked. Remember to fill out the blood pressure activity diary. If you experience severe pain or unusual pain (not associated with getting your blood pressure  checked), remove the monitor.  Troubleshooting:  Code  Troubleshooting   1  Check cuff position, tighten cuff   2, 3  Remain still during reading   4, 87  Check air hose connections and make sure cuff is tight   85, 89  Check hose connections and make tubing is not crimped   86  Push START/STOP to restart reading   88, 91  Retry by pushing START/STOP   90  Replace batteries. If problem persists, remove monitor and bring back to   clinic at follow up   97, 98, 99  Service required - Remove monitor and bring back to clinic at   follow up    

## 2022-10-07 NOTE — Progress Notes (Signed)
   S:     Chief Complaint  Patient presents with   Medication Management    Amb BP Monitor - Day #1   77 y.o. female who presents for hypertension evaluation, education, and management.  PMH is significant for Hypertension.Hernandex Patient was referred and last seen by Primary Care Provider, Dr. Ardyth Harps, on 09/22/2022.   Diagnosed with Hypertension in the year of 2015.    Medication compliance is reported to be good.  Discussed procedure for wearing the monitor and gave patient written instructions. Monitor was placed on non-dominant arm with instructions to return in the morning.   Current BP Medications include:  lisinopril 40mg  daily and furosemide 40mg  daily.  Antihypertensives tried in the past include: amlodipine     O:  Review of Systems  Cardiovascular:  Positive for leg swelling.    Physical Exam Vitals reviewed.  Pulmonary:     Effort: Pulmonary effort is normal.  Musculoskeletal:     Right lower leg: Edema (2+pitting) present.     Left lower leg: Edema (2+ pitting) present.  Neurological:     Mental Status: She is alert.  Psychiatric:        Mood and Affect: Mood normal.        Thought Content: Thought content normal.        Judgment: Judgment normal.     Last 3 Office BP readings: BP Readings from Last 3 Encounters:  10/07/22 (!) 182/80  09/22/22 128/76  09/22/22 130/70    Clinical Atherosclerotic Cardiovascular Disease (ASCVD): No  The 10-year ASCVD risk score (Arnett DK, et al., 2019) is: 57.3%   Values used to calculate the score:     Age: 74 years     Sex: Female     Is Non-Hispanic African American: No     Diabetic: Yes     Tobacco smoker: No     Systolic Blood Pressure: 182 mmHg     Is BP treated: Yes     HDL Cholesterol: 47 MG/DL     Total Cholesterol: 231 MG/DL  Basic Metabolic Panel    Component Value Date/Time   NA 138 04/09/2022 1052   K 4.2 04/09/2022 1052   CL 98 04/09/2022 1052   CO2 32 04/09/2022 1052   GLUCOSE 154  (H) 04/09/2022 1052   BUN 20 04/09/2022 1052   CREATININE 0.77 04/09/2022 1052   CALCIUM 9.6 04/09/2022 1052   GFRNONAA >60 05/29/2016 1559   GFRAA >60 05/29/2016 1559    Renal function: CrCl cannot be calculated (Patient's most recent lab result is older than the maximum 21 days allowed.).   ABPM Study Data: Arm Placement left arm   For Office Goal Goal BP of <140 systolic :  and lower if tolerated.  ABPM thresholds: Overall BP < 130, daytime BP <140mmHg, sleeptime BP <120 mmHg    A/P: History of hypertension since 2015 currently taking; lisinopril 40mg  daily and furosemide 40mg  daily.  Goal blood pressure is < 140 systolic or lower if tolerated.  -Placed blood pressure cuff, provided education, patient instructed to wear cuff for 24 hours and return tomorrow to review results.   Written patient instructions provided including activity/symptom/event log. Patient verbalized understanding of plan. Total time in face to face counseling 17 minutes.    Follow-up: Tomorrow AM - early morning appointment 8:30 AM   Patient seen with Adam Phenix, PharmD, PGY-1 Pharmacy Resident.

## 2022-10-07 NOTE — Progress Notes (Deleted)
   S:     Chief Complaint  Patient presents with   Medication Management    Amb BP Monitor - Day #1   77 y.o. female who presents for hypertension evaluation, education, and management.  PMH is significant for HTN, T2DM, HLD, GERD, Asthma.  Patient was referred and last seen by Primary Care Provider, Dr. Ardyth Harps on 09/22/22.  Medication compliance is reported to be appropriate.  Discussed procedure for wearing the monitor and gave patient written instructions. Monitor was placed on non-dominant arm with instructions to return in the morning.   Current BP Medications include:  lisinopril 40 mg daily, furosemide 40 mg daily  Antihypertensives tried in the past include: amlodipine  Dietary habits include:  O:  Review of Systems  Cardiovascular:  Positive for leg swelling (bilaterally).  All other systems reviewed and are negative.   Physical Exam Pulmonary:     Effort: Pulmonary effort is normal.  Musculoskeletal:     Right lower leg: Edema (2+ Pitting) present.     Left lower leg: Edema (2+ pitting) present.  Neurological:     Mental Status: She is alert.  Psychiatric:        Mood and Affect: Mood normal.        Behavior: Behavior normal.        Thought Content: Thought content normal.     Last 3 Office BP readings: BP Readings from Last 3 Encounters:  10/07/22 (!) 182/80  09/22/22 128/76  09/22/22 130/70    Clinical Atherosclerotic Cardiovascular Disease (ASCVD): No  The 10-year ASCVD risk score (Arnett DK, et al., 2019) is: 57.3%   Values used to calculate the score:     Age: 6 years     Sex: Female     Is Non-Hispanic African American: No     Diabetic: Yes     Tobacco smoker: No     Systolic Blood Pressure: 182 mmHg     Is BP treated: Yes     HDL Cholesterol: 47 MG/DL     Total Cholesterol: 231 MG/DL  Basic Metabolic Panel    Component Value Date/Time   NA 138 04/09/2022 1052   K 4.2 04/09/2022 1052   CL 98 04/09/2022 1052   CO2 32 04/09/2022  1052   GLUCOSE 154 (H) 04/09/2022 1052   BUN 20 04/09/2022 1052   CREATININE 0.77 04/09/2022 1052   CALCIUM 9.6 04/09/2022 1052   GFRNONAA >60 05/29/2016 1559   GFRAA >60 05/29/2016 1559    Renal function: CrCl cannot be calculated (Patient's most recent lab result is older than the maximum 21 days allowed.).   ABPM Study Data: Arm Placement left arm  For Office Goal Goal BP of <130/80:  ABPM thresholds: Overall BP < 125/75, daytime BP <130/80 mmHg, sleeptime BP <110/65 mmHg    A/P: History of hypertension longstanding currently taking lisinopril 40 mg and furosemide 40 mg daily with goal presssure of <130/80.     -Placed blood pressure cuff, provided education, patient instructed to wear cuff for 24 hours and return tomorrow to review results.   Written patient instructions provided including activity/symptom/event log. Patient verbalized understanding of plan. Total time in face to face counseling 16 minutes.    Follow-up: Tomorrow AM - early morning appointment 8:30  Patient seen with  Adam Phenix, PharmD, PGY-1 Pharmacy Resident.

## 2022-10-07 NOTE — Assessment & Plan Note (Addendum)
History of hypertension since 2015 currently taking; lisinopril 40mg  daily and furosemide 40mg  daily.  Goal blood pressure is < 140 systolic or lower if tolerated.  -Placed blood pressure cuff, provided education, patient instructed to wear cuff for 24 hours and return tomorrow to review results.

## 2022-10-08 ENCOUNTER — Ambulatory Visit (INDEPENDENT_AMBULATORY_CARE_PROVIDER_SITE_OTHER): Payer: Medicare Other | Admitting: Pharmacist

## 2022-10-08 ENCOUNTER — Encounter: Payer: Self-pay | Admitting: Pharmacist

## 2022-10-08 VITALS — BP 157/68 | HR 58

## 2022-10-08 DIAGNOSIS — I10 Essential (primary) hypertension: Secondary | ICD-10-CM

## 2022-10-08 MED ORDER — HYDROCHLOROTHIAZIDE 12.5 MG PO CAPS
12.5000 mg | ORAL_CAPSULE | Freq: Every day | ORAL | 6 refills | Status: DC
Start: 2022-10-08 — End: 2023-01-09

## 2022-10-08 NOTE — Progress Notes (Signed)
S:     Chief Complaint  Patient presents with   Medication Management    Ambulatory BP - Day #2   77 y.o. female who presents for hypertension evaluation, education, and management.  PMH is significant for HTN.  Patient returns to clinic with 24 hour blood pressure monitor and reports some pain overnight with the cuff but was able to tolerate.  Patient reports they were able to wear the Ambulatory Blood Pressure Cuff for the entire 24 evaluation period.   O:  Review of Systems  All other systems reviewed and are negative.   Physical Exam Constitutional:      Appearance: Normal appearance.  Pulmonary:     Effort: Pulmonary effort is normal.  Musculoskeletal:     Right lower leg: Edema present.     Left lower leg: Edema present.  Neurological:     Mental Status: She is alert.  Psychiatric:        Mood and Affect: Mood normal.        Thought Content: Thought content normal.        Judgment: Judgment normal.     Last 3 Office BP readings: BP Readings from Last 3 Encounters:  10/07/22 (!) 182/80  09/22/22 128/76  09/22/22 130/70    Clinical Atherosclerotic Cardiovascular Disease (ASCVD): No  The 10-year ASCVD risk score (Arnett DK, et al., 2019) is: 57.3%   Values used to calculate the score:     Age: 64 years     Sex: Female     Is Non-Hispanic African American: No     Diabetic: Yes     Tobacco smoker: No     Systolic Blood Pressure: 182 mmHg     Is BP treated: Yes     HDL Cholesterol: 47 MG/DL     Total Cholesterol: 231 MG/DL  Basic Metabolic Panel    Component Value Date/Time   NA 138 04/09/2022 1052   K 4.2 04/09/2022 1052   CL 98 04/09/2022 1052   CO2 32 04/09/2022 1052   GLUCOSE 154 (H) 04/09/2022 1052   BUN 20 04/09/2022 1052   CREATININE 0.77 04/09/2022 1052   CALCIUM 9.6 04/09/2022 1052   GFRNONAA >60 05/29/2016 1559   GFRAA >60 05/29/2016 1559    Renal function: CrCl cannot be calculated (Patient's most recent lab result is older than the  maximum 21 days allowed.).   ABPM Study Data: Arm Placement left arm  Overall Mean 24hr BP:   150/68 mmHg  HR: 56  Daytime Mean BP:  157/68 mmHg  HR: 58  Nighttime Mean BP:  132/60 mmHg  HR: 52  Dipping Pattern: Yes.    Sys:   15.9%   Dia: 11.7%   [normal dipping ~10-20%]  For Office Goal Goal BP of <140 systolic :  and lower if tolerated.  ABPM thresholds: Overall BP < 130, daytime BP <158mmHg, sleeptime BP <120 mmHg   A/P: History of hypertension longstanding since 2015 currently taking; lisinopril 40 mg daily and furosemide 40 mg daily with goal presssure of <140 systolic or lower if tolerated. Found to have isolated systolic hypertension with 24-hour ambulatory blood pressure evaluation which demonstrates an average AWAKE blood pressure of 157/68 mmHg and a pulse of 58.  Nocturnal dipping pattern is normal.  - Start hydrochlorothiazide 12.5 mg once daily - Continue lisinopril 40 mg once daily - Adjusted furosemide 40 mg daily to once daily PRN given initiation of thiazide. Counseled pt she may still need to take lasix  every day but if swelling is reduced she may hold the furosemide for a day and then reevaluated need for next dose.   Results reviewed and written information provided.   BMET and lipid panel planned for visit on 10/16/2022 - patient asked to arrive fasting.   Written patient instructions provided. Patient verbalized understanding of treatment plan.  Total time in face to face counseling 23 minutes.    Follow-up:  Pharmacist 10/16/2022 PCP clinic visit in 10/21/2022  Patient seen with Adam Phenix, PharmD, PGY-1 Pharmacy Resident.

## 2022-10-08 NOTE — Progress Notes (Signed)
Reviewed and agree with Dr Koval's plan.   

## 2022-10-08 NOTE — Patient Instructions (Addendum)
It was nice to see you today!  Your goal blood pressure is <130/80  Medication Changes: Start hydrochlorothiazide 12.5 mg once daily  Continue lisinopril 40 mg once daily  Adjust furosemide to as needed for leg swelling  Return on 8/1 for lab visit - please arrive fasting for appointment   Monitor blood pressure at home daily and keep a log (on your phone or piece of paper) to bring with you to your next visit. Write down date, time, blood pressure and pulse.  Keep up the good work with diet and exercise. Aim for a diet full of vegetables, fruit and lean meats (chicken, [place], fish). Try to limit salt intake by eating fresh or frozen vegetables (instead of canned), rinse canned vegetables prior to cooking and do not add any additional salt to meals.

## 2022-10-08 NOTE — Assessment & Plan Note (Signed)
History of hypertension longstanding since 2015 currently taking; lisinopril 40 mg daily and furosemide 40 mg daily with goal presssure of <140 systolic or lower if tolerated. Found to have isolated systolic hypertension with 24-hour ambulatory blood pressure evaluation which demonstrates an average AWAKE blood pressure of 157/68 mmHg and a pulse of 58.  Nocturnal dipping pattern is normal.  - Start hydrochlorothiazide 12.5 mg once daily - Continue lisinopril 40 mg once daily - Adjusted furosemide 40 mg daily to once daily PRN given initiation of thiazide. Counseled pt she may still need to take lasix every day but if swelling is reduced she may hold the furosemide for a day and then reevaluated need for next dose.

## 2022-10-16 ENCOUNTER — Ambulatory Visit: Payer: Medicare Other | Admitting: Pharmacist

## 2022-10-16 ENCOUNTER — Encounter: Payer: Self-pay | Admitting: Pharmacist

## 2022-10-16 VITALS — BP 144/54 | HR 56 | Ht 63.0 in | Wt 199.2 lb

## 2022-10-16 DIAGNOSIS — I10 Essential (primary) hypertension: Secondary | ICD-10-CM

## 2022-10-16 DIAGNOSIS — E785 Hyperlipidemia, unspecified: Secondary | ICD-10-CM | POA: Diagnosis not present

## 2022-10-16 NOTE — Patient Instructions (Signed)
It was nice to see you today!  Your goal blood pressure is 140 mmHg for your top (systolic) number.  Lower is OK if you can tolerate without any dizziness.  Medication Changes: None  Furosemide - Take if you notice significant swelling or if your weight is higher than 198 pounds when you check daily.   Continue all other medication the same.    Monitor blood pressure at home daily and keep a log (on your phone or piece of paper) to bring with you to your next visit. Write down date, time, blood pressure and pulse.  Keep up the good work with diet and exercise. Aim for a diet full of vegetables, fruit and lean meats (chicken, [place], fish). Try to limit salt intake by eating fresh or frozen vegetables (instead of canned), rinse canned vegetables prior to cooking and do not add any additional salt to meals.

## 2022-10-16 NOTE — Progress Notes (Signed)
S:     Chief Complaint  Patient presents with   Medication Management    Hypertension - Follow-up   77 y.o. female who presents for hypertension evaluation, education, and management.  PMH is significant for Hypertension, Diabetes.  Patient was referred and last seen by Primary Care Provider, Dr. Ardyth Harps, on 09/22/2022.   At last visit, hydrochlorothiazide was initiated.   Today, patient arrives in good spirits and presents without assistance.  Denies dizziness, headache, blurred vision, swelling.    Patient reports hypertension was diagnosed in 2015.   Medication adherence good. Patient has taken BP medications today.   Current antihypertensives include: Lisinopril 40mg  and hydrochlorothiazide 12.5mg  daily.  Patient has continued to take furosemide daily since last visit.   Antihypertensives tried in the past include: amlodipine  Reported home BP readings: 125-147 with majority of systolic readings < 135 and 67-77 diastolic.   O:  Review of Systems  Cardiovascular:  Negative for leg swelling.  All other systems reviewed and are negative.   Physical Exam Vitals reviewed.  Constitutional:      Appearance: Normal appearance.  Pulmonary:     Effort: Pulmonary effort is normal.  Musculoskeletal:     Right lower leg: No edema (improved).     Left lower leg: No edema (improved).  Neurological:     Mental Status: She is alert.  Psychiatric:        Mood and Affect: Mood normal.        Behavior: Behavior normal.        Thought Content: Thought content normal.        Judgment: Judgment normal.     Last 3 Office BP readings: BP Readings from Last 3 Encounters:  10/16/22 (!) 144/54  10/08/22 (!) 157/68  10/07/22 (!) 182/80    BMET    Component Value Date/Time   NA 138 04/09/2022 1052   K 4.2 04/09/2022 1052   CL 98 04/09/2022 1052   CO2 32 04/09/2022 1052   GLUCOSE 154 (H) 04/09/2022 1052   BUN 20 04/09/2022 1052   CREATININE 0.77 04/09/2022 1052   CALCIUM  9.6 04/09/2022 1052   GFRNONAA >60 05/29/2016 1559   GFRAA >60 05/29/2016 1559    Renal function: CrCl cannot be calculated (Patient's most recent lab result is older than the maximum 21 days allowed.).  Clinical ASCVD: No  The 10-year ASCVD risk score (Arnett DK, et al., 2019) is: 41.2%   Values used to calculate the score:     Age: 39 years     Sex: Female     Is Non-Hispanic African American: No     Diabetic: Yes     Tobacco smoker: No     Systolic Blood Pressure: 144 mmHg     Is BP treated: Yes     HDL Cholesterol: 47 MG/DL     Total Cholesterol: 231 MG/DL   A/P: Hypertension diagnosed in 2015 and currently appears much improved with addition of hydrochlorothiazide to lisinopril. Goal blood pressure is < 140 systolic or lower if tolerated. Medication adherence appears good.  Home readings are reassuring that blood pressure is improved and near goal.  Leg swelling is minimal.  -Continued lisinopril 40mg  daily and hydrochlorothiazide 12.5mg  daily  -Adjusted dose of furosemide to Daily PRN swelling or weight > 198 pounds until next follow-up with PCP.  -Patient educated on purpose, proper use, and potential adverse effects.  -F/u labs ordered BMET -Counseled on lifestyle modifications for blood pressure control including reduced dietary  sodium, increased exercise, adequate sleep. -Encouraged patient to check BP at home and bring log of readings to next visit. Counseled on proper use of home BP cuff.    History of hyperlipidemia taking pravastatin 20mg  daily.  Last cholesterol panel, > 1 year ago -repeated FASTING lipid panel today.   Results reviewed and written information provided.    Written patient instructions provided. Patient verbalized understanding of treatment plan.  Total time in face to face counseling 25 minutes.    Follow-up:  Pharmacist PRN. PCP clinic visit in 1 week (8/6).  Patient seen with Rickey Primus, PharmD Candidate.

## 2022-10-16 NOTE — Assessment & Plan Note (Signed)
History of hyperlipidemia taking pravastatin 20mg  daily.  Last cholesterol panel, > 1 year ago -repeated FASTING lipid panel today.

## 2022-10-16 NOTE — Assessment & Plan Note (Signed)
Hypertension diagnosed in 2015 and currently appears much improved with addition of hydrochlorothiazide to lisinopril. Goal blood pressure is < 140 systolic or lower if tolerated. Medication adherence appears good.  Home readings are reassuring that blood pressure is improved and near goal.  Leg swelling is minimal.  -Continued lisinopril 40mg  daily and hydrochlorothiazide 12.5mg  daily  -Adjusted dose of furosemide to Daily PRN swelling or weight > 198 pounds until next follow-up with PCP.  -Patient educated on purpose, proper use, and potential adverse effects.  -F/u labs ordered BMET -Counseled on lifestyle modifications for blood pressure control including reduced dietary sodium, increased exercise, adequate sleep. -Encouraged patient to check BP at home and bring log of readings to next visit. Counseled on proper use of home BP cuff.

## 2022-10-17 ENCOUNTER — Encounter: Payer: Self-pay | Admitting: Family Medicine

## 2022-10-17 NOTE — Progress Notes (Signed)
Reviewed and agree with Dr Koval's plan.   

## 2022-10-20 ENCOUNTER — Ambulatory Visit
Admission: RE | Admit: 2022-10-20 | Discharge: 2022-10-20 | Disposition: A | Discharge status: Home / Self Care | Payer: Medicare Other | Source: Ambulatory Visit | Attending: Family Medicine | Admitting: Family Medicine

## 2022-10-20 ENCOUNTER — Other Ambulatory Visit: Payer: Self-pay | Admitting: Student

## 2022-10-20 ENCOUNTER — Encounter: Payer: Self-pay | Admitting: Student

## 2022-10-20 ENCOUNTER — Ambulatory Visit (INDEPENDENT_AMBULATORY_CARE_PROVIDER_SITE_OTHER): Payer: Medicare Other | Admitting: Student

## 2022-10-20 VITALS — BP 122/65 | HR 59 | Wt 200.2 lb

## 2022-10-20 DIAGNOSIS — M546 Pain in thoracic spine: Secondary | ICD-10-CM

## 2022-10-20 MED ORDER — OXYCODONE HCL 5 MG PO TABS: 2.5000 mg | ORAL_TABLET | Freq: Two times a day (BID) | ORAL | 0 refills | Status: DC | PRN

## 2022-10-20 NOTE — Assessment & Plan Note (Addendum)
Seems MSK in nature, no red flag symptoms. Worry about compression fracture given extent of pain, XR ordered. Low dose oxy 2.5 mg twice a day as needed in acute flare of pain, discussed with faculty. Additionally, continue with lidocaine patches and Tylenol, has patches OTC. No rash, unlikely shingles, no urinary symptoms. Hold off on back exercises in acute pain. Follow up with Dr. Hyacinth Meeker tomorrow.

## 2022-10-20 NOTE — Progress Notes (Signed)
  SUBJECTIVE:   CHIEF COMPLAINT / HPI:   Back Pain She reports back pain for 3 days. There was not an injury that may have caused the pain. Patient notes that she was on a three hour long car ride prior to the onset of pain  The pain is located in the thoracic region and started as she stood to get out of her car.   It is described as sharp and radiates from the muscles to the spine. Symptoms are aggravated by standing, improved with laying down  She has tried Tizanidine for treatment with mild relief; helped her sleep last night   Never had imaging of the back  Last DEXA scan was normal per patient in [place] Pain with deep breathing   Denies bowel or bladder incontinence, unilateral weakness, numbness   PERTINENT  PMH / PSH:   HTN -- Furosemide PRN, hydrochlorothiazide, Lisinopril w/ last BMET 8/1 DM2 -- A1C 6.8, albuminuria to 51, GLP1 started, due for foot exam  OA Estrogen therapy use -- Estradiol every other day  HLD -- Pravastatin 20 mg, Lipid panel elevated previously  Obesity -- GLP1  Patient Care Team: Glendale Chard, DO as PCP - General (Family Medicine) Marcille Buffy (Optometry) Currence, Vladimir Crofts, PA-C (Orthopedic Surgery) OBJECTIVE:  BP 122/65 (BP Location: Left Arm, Patient Position: Sitting, Cuff Size: Normal)   Pulse (!) 59   Wt 200 lb 3.2 oz (90.8 kg)   SpO2 99%   BMI 35.46 kg/m  Physical Exam  General: Alert and oriented in no apparent distress, nontoxic Heart: Regular rate and rhythm with no murmurs appreciated Lungs: CTA bilaterally, no wheezing Skin: Warm and dry Extremities: No lower extremity edema Back Exam:  Inspection: Unremarkable  Palpable tenderness: right paraspinal muscle, along middle rib, mildly TTP over thoracic spine  Range of Motion:  Flexion limited 2/2 pain; Extension limited 2/2 pain;  Leg strength: Quad: 5/5 Hamstring: 5/5  Strength at foot: Plantar-flexion: 5/5 Dorsi-flexion: 5/5  Sensory change: Gross sensation intact to all  lumbar and sacral dermatomes.  Gait tentative, no foot drop  ASSESSMENT/PLAN:  Acute right-sided thoracic back pain Assessment & Plan: Seems MSK in nature, no red flag symptoms. Worry about compression fracture given extent of pain. Low dose oxy 2.5 mg twice a day as needed in acute flare of pain, discussed with faculty. Additionally, continue with lidocaine patches and Tylenol, has patches OTC. No rash, unlikely shingles, no urinary symptoms. Hold off on back exercises in acute pain. Follow up with Dr. Hyacinth Meeker tomorrow.   Orders: -     oxyCODONE HCl; Take 0.5 tablets (2.5 mg total) by mouth 2 (two) times daily as needed for severe pain.  Dispense: 10 tablet; Refill: 0 -     DG Thoracic Spine 4V; Future   Return in about 1 week (around 10/27/2022), or if symptoms worsen or fail to improve. Alfredo Martinez, MD 10/20/2022, 2:05 PM PGY-3, [facility]

## 2022-10-20 NOTE — Patient Instructions (Addendum)
It was great to see you today! Thank you for choosing [facility] for your primary care.  Today we addressed: Back pain  Use the oxycodone 2.5 mg twice a day. Anticipate that it could make you sleepy Stop the Tizanidine  Take 650-100 mg of Tylenol every 6 hours as needed for pain relief or fever.  Avoid taking more than 3000 mg in a 24-hour period (This may cause liver damage). Please go to [address] for your XR, you do not need an appt    HOME CARE INSTRUCTIONS For many people, back pain returns. Since low back pain is rarely dangerous, it is often a condition that people can learn to manage on their own.  Remain active. It is stressful on the back to sit or stand in one place. Do not sit, drive, or stand in one place for more than 30 minutes at a time. Take short walks on level surfaces as soon as pain allows. Try to increase the length of time you walk each day.  Do not stay in bed. Resting more than 1 or 2 days can delay your recovery.  Do not avoid exercise or work. Your body is made to move. It is not dangerous to be active, even though your back may hurt. Your back will likely heal faster if you return to being active before your pain is gone.  Pay attention to your body when you  bend and lift. Many people have less discomfort when lifting if they bend their knees, keep the load close to their bodies, and avoid twisting. Often, the most comfortable positions are those that put less stress on your recovering back.  Find a comfortable position to sleep. Use a firm mattress and lie on your side with your knees slightly bent. If you lie on your back, put a pillow under your knees.  Only take over-the-counter or prescription medicines as directed by your caregiver. Over-the-counter medicines to reduce pain and inflammation are often the most helpful. Your caregiver may prescribe muscle relaxant drugs. These medicines help dull your pain so you can more quickly return to your  normal activities and healthy exercise.  Put ice on the injured area.  Put ice in a plastic bag.  Place a towel between your skin and the bag.  Leave the ice on for 15 to 20 minutes, 3 to 4 times a day for the first 2 to 3 days. After that, ice and heat may be alternated to reduce pain and spasms.  Ask your caregiver about trying back exercises and gentle massage. This may be of some benefit.  Avoid feeling anxious or stressed. Stress increases muscle tension and can worsen back pain. It is important to recognize when you are anxious or stressed and learn ways to manage it. Exercise is a great option.  SEEK IMMEDIATE MEDICAL CARE IF:  You have pain that radiates from your back into your legs.  You develop new bowel or bladder control problems.  You have unusual weakness or numbness in your arms or legs.  You develop nausea or vomiting.  You develop abdominal pain.  You feel faint.    If you haven't already, sign up for My Chart to have easy access to your labs results, and communication with your primary care physician. I recommend that you always bring your medications to each appointment as this makes it easy to ensure you are on the correct medications and helps [place] not miss refills when you need them.  Call the clinic at [phone] if your symptoms worsen or you have any concerns. Return in about 1 week (around 10/27/2022), or if symptoms worsen or fail to improve. Please arrive 15 minutes before your appointment to ensure smooth check in process.  We appreciate your efforts in making this happen.  Thank you for allowing me to participate in your care, Alfredo Martinez, MD 10/20/2022, 1:53 PM PGY-3, [facility]

## 2022-10-21 ENCOUNTER — Encounter: Payer: Self-pay | Admitting: Student

## 2022-10-21 ENCOUNTER — Ambulatory Visit (INDEPENDENT_AMBULATORY_CARE_PROVIDER_SITE_OTHER): Payer: Medicare Other | Admitting: Student

## 2022-10-21 VITALS — BP 142/75 | HR 49 | Ht 63.0 in | Wt 198.6 lb

## 2022-10-21 DIAGNOSIS — E785 Hyperlipidemia, unspecified: Secondary | ICD-10-CM

## 2022-10-21 DIAGNOSIS — I10 Essential (primary) hypertension: Secondary | ICD-10-CM | POA: Diagnosis not present

## 2022-10-21 DIAGNOSIS — E782 Mixed hyperlipidemia: Secondary | ICD-10-CM

## 2022-10-21 NOTE — Assessment & Plan Note (Signed)
Most likely familial with A1c 6.8, denies drinking alcohol, and hx of mother with hypertriglyceridemia. Will continue pravastatin 20 mg daily, fenofibrate 160 mg daily and add fish oil supplement. Will recheck fasting lipid panel in 3 months. If triglycerides are >250 will add Vascepa for additional control.

## 2022-10-21 NOTE — Patient Instructions (Addendum)
It was great to see you today!   Today we addressed: High triglycerides - try fish oil supplements at home to see if that improves your numbers. Avoid alcohol as this can raise your triglycerides.  We can also start you on a new medication called Vascepa to see if this will give you additional coverage.   No future appointments.  Please arrive 15 minutes before your appointment to ensure smooth check in process.    Please call the clinic at [phone] if your symptoms worsen or you have any concerns.  Thank you for allowing me to participate in your care, Dr. Glendale Chard [facility]

## 2022-10-21 NOTE — Progress Notes (Signed)
    SUBJECTIVE:   CHIEF COMPLAINT / HPI:   Brooke Castillo is a 77 y.o. female  presenting for follow up of chronic conditions:   HTN:  Current meds: hydrochlorothiazide 12.5 mg daily, Lisinopril 40 mg daily, lasix prn Completed 24 hr blood pressure monitoring w/ Dr. Raymondo Band showing elevated pressures.   HLD:  Triglycerides elevated >500. Hx of mother with elevated triglycerides. She is compliant on pravastatin and fenofibrate taking them daily. She denies alcohol use.   PERTINENT  PMH / PSH: Reviewed and updated   OBJECTIVE:   BP (!) 142/75   Pulse (!) 49   Ht 5\' 3"  (1.6 m)   Wt 198 lb 9.6 oz (90.1 kg)   SpO2 98%   BMI 35.18 kg/m   Well-appearing, no acute distress Cardio: Regular rate, regular rhythm, no murmurs on exam. Pulm: Clear, no wheezing, no crackles. No increased work of breathing Abdominal: bowel sounds present, soft, non-tender, non-distended MSK: tenderness over the T9-10 right dermatomes without signs of rash Extremities: no peripheral edema  Neuro: alert and oriented x3, speech normal in content, no facial asymmetry, strength intact and equal bilaterally in UE and LE, pupils equal and reactive to light.  Psych:  Cognition and judgment appear intact. Alert, communicative  and cooperative with normal attention span and concentration. No apparent delusions, illusions, hallucinations      10/21/2022    9:52 AM 09/22/2022    9:48 AM 07/25/2022    4:55 PM  PHQ9 SCORE ONLY  PHQ-9 Total Score 0 9 0      ASSESSMENT/PLAN:   HTN (hypertension) BP elevated in the office today however patient brought BP recordings from home which were much lower. Due to her increased back pain will not make any medication adjustments today. Continue on hydrochlorothiazide and lisinopril 40 mg daily. If pressures are still elevated consider increasing hydrochlorothiazide to 25 mg daily.   Mixed hyperlipidemia Most likely familial with A1c 6.8, denies drinking alcohol, and hx of mother  with hypertriglyceridemia. Will continue pravastatin 20 mg daily, fenofibrate 160 mg daily and add fish oil supplement. Will recheck fasting lipid panel in 3 months. If triglycerides are >250 will add Vascepa for additional control.      Glendale Chard, DO [facility]

## 2022-10-21 NOTE — Assessment & Plan Note (Signed)
BP elevated in the office today however patient brought BP recordings from home which were much lower. Due to her increased back pain will not make any medication adjustments today. Continue on hydrochlorothiazide and lisinopril 40 mg daily. If pressures are still elevated consider increasing hydrochlorothiazide to 25 mg daily.

## 2022-10-29 ENCOUNTER — Other Ambulatory Visit: Payer: Self-pay | Admitting: Student

## 2022-10-29 DIAGNOSIS — E119 Type 2 diabetes mellitus without complications: Secondary | ICD-10-CM

## 2022-10-29 MED ORDER — SEMAGLUTIDE(0.25 OR 0.5MG/DOS) 2 MG/1.5ML ~~LOC~~ SOPN
0.2500 mg | PEN_INJECTOR | SUBCUTANEOUS | 0 refills | Status: DC
Start: 2022-10-29 — End: 2022-11-04

## 2022-10-30 DIAGNOSIS — K080 Exfoliation of teeth due to systemic causes: Secondary | ICD-10-CM | POA: Diagnosis not present

## 2022-11-03 DIAGNOSIS — K080 Exfoliation of teeth due to systemic causes: Secondary | ICD-10-CM | POA: Diagnosis not present

## 2022-11-04 ENCOUNTER — Other Ambulatory Visit: Payer: Self-pay | Admitting: Student

## 2022-11-04 ENCOUNTER — Telehealth: Payer: Self-pay

## 2022-11-04 DIAGNOSIS — E119 Type 2 diabetes mellitus without complications: Secondary | ICD-10-CM

## 2022-11-04 MED ORDER — SEMAGLUTIDE(0.25 OR 0.5MG/DOS) 2 MG/1.5ML ~~LOC~~ SOPN
0.2500 mg | PEN_INJECTOR | SUBCUTANEOUS | 0 refills | Status: DC
Start: 2022-11-04 — End: 2023-01-09

## 2022-11-04 NOTE — Telephone Encounter (Signed)
Called patient and left HIPAA compliant voicemail. Patient should stay on current dose of Ozempic as her A1c has been very well controlled.   Glendale Chard, DO [facility], PGY-2 11/04/22 3:01 PM

## 2022-11-04 NOTE — Telephone Encounter (Signed)
Patient calls nurse line in regards to Ozempic dosing. She has been on the 0.25 mg weekly dose for the last six weeks.   She states that she is out of medication and is supposed to receive weekly injection tomorrow.   Patient is unsure if she is supposed to increase to 0.5 mg tomorrow.   Per last note from Dr. Ardyth Harps, patient may only need low dose.   Patient reports tolerating medication well, no adverse side effects.   Please advise if patient should continue on current dosing or increase to 0.5 mg.   Veronda Prude, RN

## 2022-11-20 ENCOUNTER — Other Ambulatory Visit: Payer: Self-pay | Admitting: Student

## 2022-12-01 ENCOUNTER — Telehealth: Payer: Self-pay

## 2022-12-01 NOTE — Telephone Encounter (Signed)
Patient calls nurse line reporting Covid positive test.   She reports she tested positive on Friday. She reports symptoms started on Thursday morning. She reports fevers, body aches, sore throat, cough, congestion and diarrhea.   She reports she has been taking 12hr Mucinex, Tylenol and Nyquil.  She reports symptoms have not improved as she is almost on day 5.   She is requesting to be seen and non virtual.   Patient scheduled for tomorrow in [facility]. Patient advised to wear a mask.   Discussed staying well hydrated and drinking warm fluids with honey.   ED precautions discussed with patient.

## 2022-12-02 ENCOUNTER — Ambulatory Visit (INDEPENDENT_AMBULATORY_CARE_PROVIDER_SITE_OTHER): Payer: Medicare Other | Admitting: Family Medicine

## 2022-12-02 VITALS — BP 136/70 | HR 72 | Ht 63.0 in | Wt 193.2 lb

## 2022-12-02 DIAGNOSIS — U071 COVID-19: Secondary | ICD-10-CM | POA: Diagnosis not present

## 2022-12-02 MED ORDER — PAXLOVID (300/100) 20 X 150 MG & 10 X 100MG PO TBPK
ORAL_TABLET | ORAL | 0 refills | Status: DC
Start: 2022-12-02 — End: 2023-01-09

## 2022-12-02 NOTE — Assessment & Plan Note (Signed)
Day 5 of symptoms, given comorbidities qualifies for Paxlovid treatment. Symptomatically improving and exam reassuring in clinic today -Paxlovid twice daily x 5 days -Continued supportive care with Mucinex, Tylenol and hydration.  Advised patient she can use lidocaine patch over right ribs due to pain when coughing

## 2022-12-02 NOTE — Patient Instructions (Addendum)
It was wonderful to see you today! Thank you for choosing [facility].   Please bring ALL of your medications with you to every visit.   Today we talked about:  Sorry you are feeling better!  Please take the Paxlovid as directed on the bottle (it will be 2 tablets of 1 medication and 1 tablet of the other medication twice per day).  Please continue use the Mucinex every 12 hours and Tylenol as needed for pain.  I would recommend using a lidocaine patch on the side of your ribs. Please return to care if you develop worsening fever, difficulty breathing or inability to hold down any liquids.  Please follow up as needed for persistent symptoms and with PCP in approximately 2 months  Call the clinic at [phone] if your symptoms worsen or you have any concerns.  Please be sure to schedule follow up at the front desk before you leave today.   Elberta Fortis, DO Family Medicine

## 2022-12-02 NOTE — Progress Notes (Signed)
    SUBJECTIVE:   CHIEF COMPLAINT / HPI:   COVID Symptoms started approximately 5 days ago with excessive fatigue, chills, cough with dark green sputum production.  Denies difficulty breathing and no objective fever.  Reports she is taking Mucinex twice daily, NyQuil and Tylenol as needed.  Reports she is eating small amounts of food and drinking liquid IV in addition to water staying hydrated.  Having some pain on her right side around her ribs.  PERTINENT  PMH / PSH: T2DM, hypertension, hyperlipidemia  OBJECTIVE:   BP 136/70   Pulse 72   Ht 5\' 3"  (1.6 m)   Wt 193 lb 3.2 oz (87.6 kg)   SpO2 97%   BMI 34.22 kg/m    General: NAD, pleasant, able to participate in exam HEENT: Moist mucous membranes, TMs clear bilaterally, no rhinorrhea noted.  Nonerythematous pharynx.  Small, nontender palpable cervical lymphadenopathy Cardiac: RRR, no murmurs. Respiratory: CTAB, normal effort, No wheezes, rales or rhonchi Extremities: no edema or cyanosis. Skin: warm and dry, no rashes noted Neuro: alert, no obvious focal deficits Psych: Normal affect and mood  ASSESSMENT/PLAN:   COVID Day 5 of symptoms, given comorbidities qualifies for Paxlovid treatment. Symptomatically improving and exam reassuring in clinic today -Paxlovid twice daily x 5 days -Continued supportive care with Mucinex, Tylenol and hydration.  Advised patient she can use lidocaine patch over right ribs due to pain when coughing    Dr. Elberta Fortis, DO [facility]

## 2023-01-06 NOTE — Progress Notes (Signed)
    SUBJECTIVE:   CHIEF COMPLAINT / HPI:   Hypertension: - Medications: hydrochlorothiazide 12.5mg  daily, Lisinopril 40mg  daily, Lasix prn - Compliance: Yes - Checking BP at home: Yes - average 140/80s - Denies any SOB, CP, vision changes, LE edema, medication SEs, or symptoms of hypotension  Diabetes Current Regimen: Ozempic 0.25 mg weekly, ran out the beginning of October. CBGs: Not checking  Last A1c:  Lab Results  Component Value Date   HGBA1C 6.6 01/09/2023    Denies polyuria, polydipsia, hypoglycemia. Last Eye Exam: 09/2022 Statin: Pravastatin 20mg  daily ACE/ARB: Lisinopril 40mg  daily   PERTINENT  PMH / PSH: T2DM, hypertension, hyperlipidemia   OBJECTIVE:   BP (!) 162/87   Pulse 61   Ht 5\' 3"  (1.6 m)   Wt 192 lb 6.4 oz (87.3 kg)   SpO2 97%   BMI 34.08 kg/m    General: NAD, pleasant, able to participate in exam Cardiac: RRR, no murmurs. Respiratory: CTAB, normal effort, No wheezes, rales or rhonchi Abdomen: Bowel sounds present, nontender, nondistended Extremities: Minimal peripheral edema (R>L).  No redness or warmth of right calf noted. Skin: warm and dry, no rashes noted Neuro: alert, no obvious focal deficits Psych: Normal affect and mood  ASSESSMENT/PLAN:   Assessment & Plan Type 2 diabetes mellitus without complication, without long-term current use of insulin (HCC) A1c 6.6, congratulated patient on good control.  Did well on Ozempic, would like to restart as it does help with her food cravings. -Ozempic 0.5 mg weekly -Follow-up in 6 months for A1c Primary hypertension 162/87, suboptimal control on home readings. -Stop HCTZ 12.5mg  and lisinopril 40mg  -Start Azor 5-40 daily -Home log and follow-up for BP check in 1 month Encounter for screening mammogram for malignant neoplasm of breast Family history of breast cancer in sister, discussed and patient would like to continue mammography screening. -Mammogram ordered and patient given breast center  information Leg swelling Chronic, consistent with dependent edema. -Recommend compression stockings and leg elevation for symptomatic relief -May continue to use Lasix prn     Dr. Elberta Fortis, DO [facility]

## 2023-01-07 ENCOUNTER — Ambulatory Visit: Payer: Medicare Other | Admitting: Student

## 2023-01-09 ENCOUNTER — Other Ambulatory Visit: Payer: Self-pay | Admitting: Family Medicine

## 2023-01-09 ENCOUNTER — Ambulatory Visit (INDEPENDENT_AMBULATORY_CARE_PROVIDER_SITE_OTHER): Payer: Medicare Other | Admitting: Family Medicine

## 2023-01-09 VITALS — BP 162/87 | HR 61 | Ht 63.0 in | Wt 192.4 lb

## 2023-01-09 DIAGNOSIS — M7989 Other specified soft tissue disorders: Secondary | ICD-10-CM | POA: Diagnosis not present

## 2023-01-09 DIAGNOSIS — Z1231 Encounter for screening mammogram for malignant neoplasm of breast: Secondary | ICD-10-CM | POA: Diagnosis not present

## 2023-01-09 DIAGNOSIS — I10 Essential (primary) hypertension: Secondary | ICD-10-CM | POA: Diagnosis not present

## 2023-01-09 DIAGNOSIS — Z7985 Long-term (current) use of injectable non-insulin antidiabetic drugs: Secondary | ICD-10-CM

## 2023-01-09 DIAGNOSIS — E119 Type 2 diabetes mellitus without complications: Secondary | ICD-10-CM

## 2023-01-09 LAB — POCT GLYCOSYLATED HEMOGLOBIN (HGB A1C): HbA1c, POC (controlled diabetic range): 6.6 % (ref 0.0–7.0)

## 2023-01-09 MED ORDER — AMLODIPINE-OLMESARTAN 5-40 MG PO TABS
1.0000 | ORAL_TABLET | Freq: Every day | ORAL | 1 refills | Status: DC
Start: 2023-01-09 — End: 2023-01-12

## 2023-01-09 MED ORDER — SEMAGLUTIDE(0.25 OR 0.5MG/DOS) 2 MG/3ML ~~LOC~~ SOPN
0.5000 mg | PEN_INJECTOR | SUBCUTANEOUS | 3 refills | Status: DC
Start: 2023-01-09 — End: 2023-05-28

## 2023-01-09 NOTE — Assessment & Plan Note (Signed)
A1c 6.6, congratulated patient on good control.  Did well on Ozempic, would like to restart as it does help with her food cravings. -Ozempic 0.5 mg weekly -Follow-up in 6 months for A1c

## 2023-01-09 NOTE — Assessment & Plan Note (Signed)
Chronic, consistent with dependent edema. -Recommend compression stockings and leg elevation for symptomatic relief -May continue to use Lasix prn

## 2023-01-09 NOTE — Patient Instructions (Addendum)
It was wonderful to see you today! Thank you for choosing [facility].   Please bring ALL of your medications with you to every visit.   Today we talked about:  Please continue the Ozempic 0.5mg  weekly.  We can check your A1c again in 6 months because you are well-controlled.  You can take your fasting sugar a few times per week with a goal between 70 and 120 Please stop taking the hydrochlorothiazide and lisinopril blood pressure pills.  I would like you to take the combination medication called Azor once daily.  Please continue to check your blood pressure 4-5 times per week and return with a log.  Please see the list below for a log if needed. To obtain your mammogram you can go to the breast center.  Their phone number is [phone] to call and schedule. For the swelling in your legs I would recommend using compression socks when you are active.  When you are at home you do not have to wear them if your legs are elevated.  I do think this will help with your leg swelling more than Lasix well.  Please follow up in 1 month  Call the clinic at [phone] if your symptoms worsen or you have any concerns.  Please be sure to schedule follow up at the front desk before you leave today.   Elberta Fortis, DO Family Medicine    Blood Pressure Record Sheet To take your blood pressure, you will need a blood pressure machine. You can buy a blood pressure machine (blood pressure monitor) at your clinic, drug store, or online. When choosing one, consider: An automatic monitor that has an arm cuff. A cuff that wraps snugly around your upper arm. You should be able to fit only one finger between your arm and the cuff. A device that stores blood pressure reading results. Do not choose a monitor that measures your blood pressure from your wrist or finger. Follow your health care provider's instructions for how to take your blood pressure. To use this form: Take your blood pressure  medications every day These measurements should be taken when you have been at rest for at least 10-15 min Take at least 2 readings with each blood pressure check. This makes sure the results are correct. Wait 1-2 minutes between measurements. Write down the results in the spaces on this form. Keep in mind it should always be recorded systolic over diastolic. Both numbers are important.  Repeat this every day for 2-3 weeks, or as told by your health care provider.  Make a follow-up appointment with your health care provider to discuss the results.  Blood Pressure Log Date Medications taken? (Y/N) Blood Pressure Time of Day

## 2023-01-09 NOTE — Assessment & Plan Note (Signed)
162/87, suboptimal control on home readings. -Stop HCTZ 12.5mg  and lisinopril 40mg  -Start Azor 5-40 daily -Home log and follow-up for BP check in 1 month

## 2023-01-20 ENCOUNTER — Ambulatory Visit: Payer: Medicare Other | Admitting: Student

## 2023-01-24 DIAGNOSIS — L03213 Periorbital cellulitis: Secondary | ICD-10-CM | POA: Diagnosis not present

## 2023-01-26 ENCOUNTER — Encounter: Payer: Self-pay | Admitting: Family Medicine

## 2023-01-27 ENCOUNTER — Ambulatory Visit
Admission: RE | Admit: 2023-01-27 | Discharge: 2023-01-27 | Disposition: A | Discharge status: Home / Self Care | Payer: Medicare Other | Source: Ambulatory Visit | Attending: Family Medicine | Admitting: Family Medicine

## 2023-01-27 DIAGNOSIS — Z1231 Encounter for screening mammogram for malignant neoplasm of breast: Secondary | ICD-10-CM | POA: Diagnosis not present

## 2023-01-28 DIAGNOSIS — L814 Other melanin hyperpigmentation: Secondary | ICD-10-CM | POA: Diagnosis not present

## 2023-01-28 DIAGNOSIS — L820 Inflamed seborrheic keratosis: Secondary | ICD-10-CM | POA: Diagnosis not present

## 2023-01-28 DIAGNOSIS — L578 Other skin changes due to chronic exposure to nonionizing radiation: Secondary | ICD-10-CM | POA: Diagnosis not present

## 2023-01-28 DIAGNOSIS — L720 Epidermal cyst: Secondary | ICD-10-CM | POA: Diagnosis not present

## 2023-02-03 NOTE — Progress Notes (Unsigned)
    SUBJECTIVE:   CHIEF COMPLAINT / HPI:   Hypertension: - Medications: Azor 5-40, Lasix prn - Compliance: - Checking BP at home:  - Denies any SOB, CP, vision changes, LE edema, medication SEs, or symptoms of hypotension  *Flu/COVID? Foot exam  PERTINENT  PMH / PSH: ***  OBJECTIVE:   There were no vitals taken for this visit. ***  General: NAD, pleasant, able to participate in exam Cardiac: RRR, no murmurs. Respiratory: CTAB, normal effort, No wheezes, rales or rhonchi Abdomen: Bowel sounds present, nontender, nondistended Extremities: no edema or cyanosis. Skin: warm and dry, no rashes noted Neuro: alert, no obvious focal deficits Psych: Normal affect and mood  ASSESSMENT/PLAN:   No problem-specific Assessment & Plan notes found for this encounter.     Dr. Elberta Fortis, DO [facility]    {    This will disappear when note is signed, click to select method of visit    :1}

## 2023-02-04 ENCOUNTER — Encounter: Payer: Self-pay | Admitting: Family Medicine

## 2023-02-04 ENCOUNTER — Ambulatory Visit (INDEPENDENT_AMBULATORY_CARE_PROVIDER_SITE_OTHER): Payer: Medicare Other | Admitting: Family Medicine

## 2023-02-04 VITALS — BP 134/72 | HR 65 | Ht 63.0 in | Wt 192.4 lb

## 2023-02-04 DIAGNOSIS — E1169 Type 2 diabetes mellitus with other specified complication: Secondary | ICD-10-CM

## 2023-02-04 DIAGNOSIS — Z7985 Long-term (current) use of injectable non-insulin antidiabetic drugs: Secondary | ICD-10-CM

## 2023-02-04 DIAGNOSIS — H00015 Hordeolum externum left lower eyelid: Secondary | ICD-10-CM

## 2023-02-04 DIAGNOSIS — I10 Essential (primary) hypertension: Secondary | ICD-10-CM | POA: Diagnosis not present

## 2023-02-04 NOTE — Assessment & Plan Note (Signed)
134/72, well-controlled on current therapy

## 2023-02-04 NOTE — Assessment & Plan Note (Signed)
Fasting blood sugar 120s to 130s and weight trending down.  Denies side effects on Ozempic 0.5 mg weekly, will continue current therapy and advised patient we can titrate up if she plateaus.

## 2023-02-04 NOTE — Patient Instructions (Signed)
It was wonderful to see you today! Thank you for choosing [facility].   Please bring ALL of your medications with you to every visit.   Today we talked about:  Please continue your blood pressure medication and checking your blood pressure a few times per week. I am glad your blood sugars have been good! Please continue to make sure you are eating protein throughout the day and let me know if you plateau and would like to increase your dosing. For your eye I would recommend using tear free baby shampoo around your eye daily for the next 5 days. If you feels another stye coming on it can also help prevent then in the future. If you develop sudden vision changes or persistent fever please come back and be seen.  Please follow up in 5 months  Call the clinic at [phone] if your symptoms worsen or you have any concerns.  Please be sure to schedule follow up at the front desk before you leave today.   Elberta Fortis, DO Family Medicine

## 2023-02-18 ENCOUNTER — Other Ambulatory Visit: Payer: Self-pay | Admitting: Student

## 2023-02-18 DIAGNOSIS — M7989 Other specified soft tissue disorders: Secondary | ICD-10-CM

## 2023-04-09 ENCOUNTER — Ambulatory Visit: Payer: Medicare Other | Admitting: Family Medicine

## 2023-04-09 VITALS — BP 129/59 | HR 67 | Temp 97.9°F | Ht 63.0 in | Wt 189.5 lb

## 2023-04-09 DIAGNOSIS — J029 Acute pharyngitis, unspecified: Secondary | ICD-10-CM | POA: Diagnosis not present

## 2023-04-09 LAB — POC SOFIA 2 FLU + SARS ANTIGEN FIA
Influenza A, POC: POSITIVE — AB
Influenza B, POC: NEGATIVE
SARS Coronavirus 2 Ag: NEGATIVE

## 2023-04-09 MED ORDER — BENZONATATE 100 MG PO CAPS: 100.0000 mg | ORAL_CAPSULE | Freq: Two times a day (BID) | ORAL | 0 refills | Status: DC | PRN

## 2023-04-09 NOTE — Patient Instructions (Addendum)
Good to see you today - Thank you for coming in  Things we discussed today:  1) Your symptoms are most likely due to a viral upper respiratory infection, aka a cold. Your body should naturally fight this infection of over the next 7-14 days. You may have a lingering cough that lasts for 6-8 weeks afterwards.  -Drink plenty of water and fluids to stay hydrated.  Your urine should be clear or light yellow. -Take Tylenol or ibuprofen as needed for fevers of the 100.3 Fahrenheit or higher - Continue taking Mucinex -You can take 1 tablespoon of bees honey 4 times a day.  This can help soothe your throat. -You can also take Tessalon Perles twice a day as needed for coughing.  I have sent this to your pharmacy.  Please seek further medical attention if you: - have trouble breathing - are vomiting uncontrollably - are unable to drink enough to stay hydrated

## 2023-04-09 NOTE — Progress Notes (Signed)
    SUBJECTIVE:   CHIEF COMPLAINT / HPI:   FP is a 78yo F with history of hypertension, type 2 diabetes, hyperlipidemia that presents for cough. - Starting Monday, started to have sore throat and cough - Pt, son, daughter in law, and grandson all feeling sick since Monday - Having fevers, but stopped Tuesday night - They had gone to a big gathering for son's retirement celebration.  - Mainly bothered by cough and sore throat - Denies trouble breathing -She also reports that she is having some urinary incontinence from her how hard she has been coughing.  She reports that she does not normally have urinary incontinence.  OBJECTIVE:   BP (!) 129/59   Pulse 67   Ht 5\' 3"  (1.6 m)   Wt 189 lb 8 oz (86 kg)   SpO2 98%   BMI 33.57 kg/m   General: Alert, pleasant nontoxic-appearing woman. NAD. HEENT: NCAT. MMM. Oropharynx clear, no tonsillar exudates.  No LAD, neck supple. CV: RRR, no murmurs. Cap refill <2. Resp: Transmitted course upper airway sounds in all lung fields. Normal WOB on RA.  Abm: Soft, nontender, nondistended. BS present. Ext: Moves all ext spontaneously Skin: Warm, well perfused   ASSESSMENT/PLAN:   Assessment & Plan Sore throat Positive for flu, symptoms consistent with viral upper respiratory infection.  Vital stable here, afebrile, symptoms seem to be overall improving.  -Start Tessalon Perles 100 mg twice daily as needed for cough -Continue Tylenol and ibuprofen as needed for fevers and discomfort -Continue Mucinex -Discussed other supportive measures such as hydration, honey -Counseled on return precautions.   Lincoln Brigham, MD [facility]

## 2023-04-21 ENCOUNTER — Other Ambulatory Visit: Payer: Self-pay | Admitting: Student

## 2023-04-21 DIAGNOSIS — E785 Hyperlipidemia, unspecified: Secondary | ICD-10-CM

## 2023-04-23 ENCOUNTER — Telehealth: Payer: Self-pay | Admitting: Student

## 2023-04-23 NOTE — Telephone Encounter (Signed)
 patient dropped off form at front desk for [facility].  Verified that patient section of form has been completed.  Last DOS/WCC with PCP was 04/09/23.  Placed form in green team folder to be completed by clinical staff.  Brooke Castillo

## 2023-04-23 NOTE — Telephone Encounter (Signed)
 Reviewed form and placed in PCP's box for completion.  Glennie Hawk, CMA

## 2023-04-24 ENCOUNTER — Ambulatory Visit
Admission: EM | Admit: 2023-04-24 | Discharge: 2023-04-24 | Disposition: A | Discharge status: Home / Self Care | Payer: Medicare Other | Attending: Family Medicine | Admitting: Family Medicine

## 2023-04-24 DIAGNOSIS — J22 Unspecified acute lower respiratory infection: Secondary | ICD-10-CM | POA: Diagnosis not present

## 2023-04-24 MED ORDER — DOXYCYCLINE HYCLATE 100 MG PO CAPS: 100.0000 mg | ORAL_CAPSULE | Freq: Two times a day (BID) | ORAL | 0 refills | Status: DC

## 2023-04-24 NOTE — ED Provider Notes (Signed)
 [facility]    CSN: [id number] Arrival date & time: 04/24/23  9187      History   Chief Complaint No chief complaint on file.   HPI Brooke Castillo is a 78 y.o. female.   Presenting today with ongoing and worsening cough, chest tightness, fatigue, chills since having influenza diagnosed 04/06/2023.  She denies known fevers, chest pain, severe shortness of breath, wheezing, abdominal pain, nausea vomiting or diarrhea.  Has continued to take Mucinex, raw honey, Tylenol  Cold and sinus with minimal relief.  No known history of chronic pulmonary disease.    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 Small internal hemorrhoids. Otherwise normal colonoscopy   COLONOSCOPY     around 2017 with Dr Zulema at [facility]   KNEE SURGERY Bilateral 2009   SINOSCOPY     TONSILLECTOMY      OB History   No obstetric history on file.      Home Medications    Prior to Admission medications   Medication Sig Start Date End Date Taking? Authorizing  Provider  doxycycline  (VIBRAMYCIN ) 100 MG capsule Take 1 capsule (100 mg total) by mouth 2 (two) times daily. 04/24/23  Yes Stuart Vernell Norris, PA-C  albuterol  (PROVENTIL  HFA;VENTOLIN  HFA) 108 (90 BASE) MCG/ACT inhaler Inhale 1-2 puffs into the lungs every 4 (four) hours as needed for wheezing or shortness of breath.    [provider]  amLODipine -olmesartan  (AZOR ) 5-40 MG tablet TAKE 1 TABLET BY MOUTH DAILY 01/12/23   Cleotilde Perkins, DO  benzonatate  (TESSALON ) 100 MG capsule Take 1 capsule (100 mg total) by mouth 2 (two) times daily as needed for cough. 04/09/23   Zheng, Jacky, MD  cetirizine (ZYRTEC) 10 MG tablet Take 1 tablet by mouth every other day. 01/03/16   [provider]  cholecalciferol (VITAMIN D ) 1000 units tablet Take 1,000 Units by mouth daily.    [provider]  diclofenac Sodium (VOLTAREN) 1 % GEL APPLY TO AFFECTED JOINT UP TO 4 TIMES DAILY 02/13/22   [provider]  EPINEPHrine  0.3 mg/0.3 mL IJ SOAJ injection Use as directed for life-threatening allergic reaction. Patient taking differently: Inject 0.3 mg into the muscle once as needed (for life-threatening allergic reaction). 01/31/16   Kozlow, Camellia PARAS, MD  estradiol  (ESTRACE ) 1 MG tablet Take 0.5 mg by mouth every other day. 05/08/16  [provider]  fenofibrate  160 MG tablet Take 160 mg by mouth daily.    [provider]  fexofenadine (ALLEGRA) 180 MG tablet Take 180 mg by mouth every other day.     [provider]  fluticasone  (FLONASE) 50 MCG/ACT nasal spray Place into the nose. 03/28/21   [provider]  furosemide  (LASIX ) 40 MG tablet TAKE 1 TABLET(40 MG) BY MOUTH DAILY as needed for swelling 02/18/23   Alba Sharper, MD  glucose blood (CONTOUR TEST) test strip Please use to check blood sugar levels up to three times daily. 06/10/22   Cleotilde Perkins, DO  Magnesium Gluconate 27.5 MG TABS Take by mouth. 12/28/19   [provider]  meclizine   (ANTIVERT ) 12.5 MG tablet Take 1 tablet (12.5 mg total) by mouth 3 (three) times daily as needed for dizziness. 09/22/22   Theophilus Pagan, MD  nystatin (MYCOSTATIN/NYSTOP) powder Apply 2 times a day as needed 06/19/21   [provider]  pantoprazole  (PROTONIX ) 40 MG tablet Take 40 mg by mouth daily.    [provider]  pravastatin  (PRAVACHOL ) 20 MG tablet TAKE 1 TABLET(20 MG) BY MOUTH AT BEDTIME 04/21/23   Cleotilde Perkins, DO  Semaglutide ,0.25 or 0.5MG /DOS, 2 MG/3ML SOPN Inject 0.5 mg into the skin once a week. 01/09/23   Theophilus Pagan, MD    Family History Family History  Problem Relation Age of Onset   Heart failure Mother    COPD Mother    Hypertension Mother    Coronary artery disease Father 80       CABGx5; brain stem stroke   Colon polyps Father    Hypertension Sister    Diabetes Sister    Breast cancer Sister    CAD Brother 43       stent   Bone cancer Brother    Prostate cancer Brother    Aneurysm Paternal Grandmother    Heart disease Paternal Grandfather    Colon cancer Neg Hx    Esophageal cancer Neg Hx    Stomach cancer Neg Hx    Rectal cancer Neg Hx     Social History Social History   Tobacco Use   Smoking status: Never    Passive exposure: Past   Smokeless tobacco: Never  Vaping Use   Vaping status: Never Used  Substance Use Topics   Alcohol use: No   Drug use: No     Allergies   Bee venom, Celecoxib, Ciprofloxacin, Codeine, Diflucan [fluconazole], Ezetimibe, Flagyl [metronidazole], Niaspan [niacin er (antihyperlipidemic)], and Statins   Review of Systems Review of Systems Per HPI  Physical Exam Triage Vital Signs ED Triage Vitals [04/24/23 0853]  Encounter Vitals Group     BP (!) 155/82     Systolic BP Percentile      Diastolic BP Percentile      Pulse Rate 98     Resp 18     Temp 97.7 F (36.5 C)     Temp Source Oral     SpO2 94 %     Weight      Height      Head Circumference      Peak Flow      Pain Score 5      Pain Loc      Pain Education      Exclude from Growth Chart    No data found.  Updated Vital Signs BP (!) 155/82 (BP Location: Right Arm)   Pulse 98   Temp 97.7 F (36.5  C) (Oral)   Resp 18   SpO2 94%   Visual Acuity Right Eye Distance:   Left Eye Distance:   Bilateral Distance:    Right Eye Near:   Left Eye Near:    Bilateral Near:     Physical Exam Vitals and nursing note reviewed.  Constitutional:      Appearance: Normal appearance.  HENT:     Head: Atraumatic.     Right Ear: Tympanic membrane and external ear normal.     Left Ear: Tympanic membrane and external ear normal.     Nose: Congestion present.     Mouth/Throat:     Mouth: Mucous membranes are moist.     Pharynx: Posterior oropharyngeal erythema present.  Eyes:     Extraocular Movements: Extraocular movements intact.     Conjunctiva/sclera: Conjunctivae normal.  Cardiovascular:     Rate and Rhythm: Normal rate and regular rhythm.     Heart sounds: Normal heart sounds.  Pulmonary:     Effort: Pulmonary effort is normal.     Breath sounds: Normal breath sounds. No wheezing.  Musculoskeletal:        General: Normal range of motion.     Cervical back: Normal range of motion and neck supple.  Skin:    General: Skin is warm and dry.  Neurological:     Mental Status: She is alert and oriented to person, place, and time.  Psychiatric:        Mood and Affect: Mood normal.        Thought Content: Thought content normal.      UC Treatments / Results  Labs (all labs ordered are listed, but only abnormal results are displayed) Labs Reviewed - No data to display  EKG   Radiology No results found.  Procedures Procedures (including critical care time)  Medications Ordered in UC Medications - No data to display  Initial Impression / Assessment and Plan / UC Course  I have reviewed the triage vital signs and the nursing notes.  Pertinent labs & imaging results that were available during my care  of the patient were reviewed by me and considered in my medical decision making (see chart for details).     Given duration worsening course of symptoms, will start doxycycline  to cover for lower respiratory infection.  Continue Mucinex, over-the-counter cold congestion medications and supportive home care.  Return for any worsening symptoms. Final Clinical Impressions(s) / UC Diagnoses   Final diagnoses:  Lower respiratory tract infection     Discharge Instructions      Continue taking Mucinex, staying well-hydrated and taking big deep breaths.  I have sent in a course of antibiotics.  Follow-up if you are worsening and not improving    ED Prescriptions     Medication Sig Dispense Auth. Provider   doxycycline  (VIBRAMYCIN ) 100 MG capsule Take 1 capsule (100 mg total) by mouth 2 (two) times daily. 14 capsule Stuart Vernell Norris, [place]      PDMP not reviewed this encounter.   Stuart Vernell Mermentau, [place] 04/24/23 [phone]

## 2023-04-24 NOTE — Discharge Instructions (Signed)
 Continue taking Mucinex, staying well-hydrated and taking big deep breaths.  I have sent in a course of antibiotics.  Follow-up if you are worsening and not improving

## 2023-04-24 NOTE — ED Triage Notes (Signed)
 Pt reports she has a lingering cough since she has had the flu on the 20th of January. Feels like someone is pushing on her chest and she states she is fatigued.

## 2023-04-27 ENCOUNTER — Ambulatory Visit: Payer: Medicare Other

## 2023-04-28 ENCOUNTER — Telehealth: Payer: Self-pay | Admitting: Internal Medicine

## 2023-04-28 ENCOUNTER — Encounter (HOSPITAL_COMMUNITY): Payer: Self-pay

## 2023-04-28 ENCOUNTER — Ambulatory Visit (INDEPENDENT_AMBULATORY_CARE_PROVIDER_SITE_OTHER): Payer: Medicare Other | Admitting: Student

## 2023-04-28 ENCOUNTER — Other Ambulatory Visit: Payer: Self-pay

## 2023-04-28 ENCOUNTER — Encounter: Payer: Self-pay | Admitting: Student

## 2023-04-28 ENCOUNTER — Inpatient Hospital Stay (HOSPITAL_COMMUNITY)
Admission: EM | Admit: 2023-04-28 | Discharge: 2023-04-29 | DRG: 308 | Disposition: A | Discharge status: Home / Self Care | Payer: Medicare Other | Attending: Family Medicine | Admitting: Family Medicine

## 2023-04-28 ENCOUNTER — Emergency Department (HOSPITAL_COMMUNITY): Payer: Medicare Other

## 2023-04-28 ENCOUNTER — Ambulatory Visit: Payer: Medicare Other | Attending: Family Medicine

## 2023-04-28 VITALS — BP 151/80 | HR 66 | Ht 63.0 in | Wt 188.5 lb

## 2023-04-28 DIAGNOSIS — Z8042 Family history of malignant neoplasm of prostate: Secondary | ICD-10-CM | POA: Diagnosis not present

## 2023-04-28 DIAGNOSIS — Z860100 Personal history of colon polyps, unspecified: Secondary | ICD-10-CM

## 2023-04-28 DIAGNOSIS — J1100 Influenza due to unidentified influenza virus with unspecified type of pneumonia: Secondary | ICD-10-CM | POA: Diagnosis present

## 2023-04-28 DIAGNOSIS — J45909 Unspecified asthma, uncomplicated: Secondary | ICD-10-CM | POA: Diagnosis not present

## 2023-04-28 DIAGNOSIS — R002 Palpitations: Secondary | ICD-10-CM

## 2023-04-28 DIAGNOSIS — Z79899 Other long term (current) drug therapy: Secondary | ICD-10-CM

## 2023-04-28 DIAGNOSIS — Z6833 Body mass index (BMI) 33.0-33.9, adult: Secondary | ICD-10-CM | POA: Diagnosis not present

## 2023-04-28 DIAGNOSIS — R001 Bradycardia, unspecified: Secondary | ICD-10-CM

## 2023-04-28 DIAGNOSIS — I440 Atrioventricular block, first degree: Secondary | ICD-10-CM | POA: Diagnosis not present

## 2023-04-28 DIAGNOSIS — Z833 Family history of diabetes mellitus: Secondary | ICD-10-CM

## 2023-04-28 DIAGNOSIS — Z881 Allergy status to other antibiotic agents status: Secondary | ICD-10-CM

## 2023-04-28 DIAGNOSIS — Z888 Allergy status to other drugs, medicaments and biological substances status: Secondary | ICD-10-CM

## 2023-04-28 DIAGNOSIS — Z83719 Family history of colon polyps, unspecified: Secondary | ICD-10-CM | POA: Diagnosis not present

## 2023-04-28 DIAGNOSIS — Z7985 Long-term (current) use of injectable non-insulin antidiabetic drugs: Secondary | ICD-10-CM

## 2023-04-28 DIAGNOSIS — R42 Dizziness and giddiness: Secondary | ICD-10-CM | POA: Diagnosis not present

## 2023-04-28 DIAGNOSIS — Z90710 Acquired absence of both cervix and uterus: Secondary | ICD-10-CM | POA: Diagnosis not present

## 2023-04-28 DIAGNOSIS — I10 Essential (primary) hypertension: Secondary | ICD-10-CM | POA: Diagnosis not present

## 2023-04-28 DIAGNOSIS — E119 Type 2 diabetes mellitus without complications: Secondary | ICD-10-CM | POA: Diagnosis not present

## 2023-04-28 DIAGNOSIS — E66811 Obesity, class 1: Secondary | ICD-10-CM | POA: Diagnosis present

## 2023-04-28 DIAGNOSIS — Z825 Family history of asthma and other chronic lower respiratory diseases: Secondary | ICD-10-CM

## 2023-04-28 DIAGNOSIS — Z823 Family history of stroke: Secondary | ICD-10-CM

## 2023-04-28 DIAGNOSIS — K5790 Diverticulosis of intestine, part unspecified, without perforation or abscess without bleeding: Secondary | ICD-10-CM | POA: Diagnosis present

## 2023-04-28 DIAGNOSIS — K219 Gastro-esophageal reflux disease without esophagitis: Secondary | ICD-10-CM | POA: Diagnosis not present

## 2023-04-28 DIAGNOSIS — E1169 Type 2 diabetes mellitus with other specified complication: Secondary | ICD-10-CM

## 2023-04-28 DIAGNOSIS — I5031 Acute diastolic (congestive) heart failure: Secondary | ICD-10-CM | POA: Diagnosis present

## 2023-04-28 DIAGNOSIS — I447 Left bundle-branch block, unspecified: Secondary | ICD-10-CM | POA: Diagnosis present

## 2023-04-28 DIAGNOSIS — Z91030 Bee allergy status: Secondary | ICD-10-CM | POA: Diagnosis not present

## 2023-04-28 DIAGNOSIS — I110 Hypertensive heart disease with heart failure: Secondary | ICD-10-CM | POA: Diagnosis not present

## 2023-04-28 DIAGNOSIS — Z803 Family history of malignant neoplasm of breast: Secondary | ICD-10-CM | POA: Diagnosis not present

## 2023-04-28 DIAGNOSIS — I5030 Unspecified diastolic (congestive) heart failure: Secondary | ICD-10-CM | POA: Diagnosis not present

## 2023-04-28 DIAGNOSIS — R5383 Other fatigue: Secondary | ICD-10-CM | POA: Diagnosis not present

## 2023-04-28 DIAGNOSIS — Z8249 Family history of ischemic heart disease and other diseases of the circulatory system: Secondary | ICD-10-CM

## 2023-04-28 DIAGNOSIS — Z885 Allergy status to narcotic agent status: Secondary | ICD-10-CM

## 2023-04-28 DIAGNOSIS — E669 Obesity, unspecified: Secondary | ICD-10-CM | POA: Diagnosis not present

## 2023-04-28 DIAGNOSIS — Z7722 Contact with and (suspected) exposure to environmental tobacco smoke (acute) (chronic): Secondary | ICD-10-CM | POA: Diagnosis present

## 2023-04-28 DIAGNOSIS — E785 Hyperlipidemia, unspecified: Secondary | ICD-10-CM | POA: Diagnosis present

## 2023-04-28 DIAGNOSIS — Z950 Presence of cardiac pacemaker: Secondary | ICD-10-CM

## 2023-04-28 LAB — BASIC METABOLIC PANEL
Anion gap: 12 (ref 5–15)
BUN: 15 mg/dL (ref 8–23)
CO2: 22 mmol/L (ref 22–32)
Calcium: 9.4 mg/dL (ref 8.9–10.3)
Chloride: 103 mmol/L (ref 98–111)
Creatinine, Ser: 0.73 mg/dL (ref 0.44–1.00)
GFR, Estimated: >60 mL/min (ref >60)
Glucose, Bld: 128 mg/dL — ABNORMAL HIGH (ref 70–99)
Potassium: 4.1 mmol/L (ref 3.5–5.1)
Sodium: 137 mmol/L (ref 135–145)

## 2023-04-28 LAB — CBC WITH DIFFERENTIAL/PLATELET
Abs Immature Granulocytes: 0.04 10*3/uL (ref 0.00–0.07)
Basophils Absolute: 0 10*3/uL (ref 0.0–0.1)
Basophils Relative: 1 %
Eosinophils Absolute: 0.1 10*3/uL (ref 0.0–0.5)
Eosinophils Relative: 1 %
HCT: 44.8 % (ref 36.0–46.0)
Hemoglobin: 15.4 g/dL — ABNORMAL HIGH (ref 12.0–15.0)
Immature Granulocytes: 1 %
Lymphocytes Relative: 29 %
Lymphs Abs: 2.2 10*3/uL (ref 0.7–4.0)
MCH: 31.2 pg (ref 26.0–34.0)
MCHC: 34.4 g/dL (ref 30.0–36.0)
MCV: 90.7 fL (ref 80.0–100.0)
Monocytes Absolute: 0.5 10*3/uL (ref 0.1–1.0)
Monocytes Relative: 7 %
Neutro Abs: 4.7 10*3/uL (ref 1.7–7.7)
Neutrophils Relative %: 61 %
Platelets: 251 10*3/uL (ref 150–400)
RBC: 4.94 MIL/uL (ref 3.87–5.11)
RDW: 12.8 % (ref 11.5–15.5)
WBC: 7.5 10*3/uL (ref 4.0–10.5)
nRBC: 0 % (ref 0.0–0.2)

## 2023-04-28 LAB — POCT GLYCOSYLATED HEMOGLOBIN (HGB A1C): HbA1c, POC (controlled diabetic range): 5.8 % (ref 0.0–7.0)

## 2023-04-28 LAB — TSH: TSH: 3.381 u[IU]/mL (ref 0.350–4.500)

## 2023-04-28 LAB — TROPONIN I (HIGH SENSITIVITY)
Troponin I (High Sensitivity): 6 ng/L (ref <18)
Troponin I (High Sensitivity): 8 ng/L (ref <18)

## 2023-04-28 MED ORDER — PRAVASTATIN SODIUM 40 MG PO TABS
20.0000 mg | ORAL_TABLET | Freq: Every day | ORAL | Status: DC
  Administered 2023-04-28: 20 mg via ORAL
  Filled 2023-04-28: qty 2

## 2023-04-28 MED ORDER — FENOFIBRATE 160 MG PO TABS
160.0000 mg | ORAL_TABLET | Freq: Every evening | ORAL | Status: DC
  Administered 2023-04-28: 160 mg via ORAL
  Filled 2023-04-28 (×3): qty 1

## 2023-04-28 MED ORDER — HEPARIN SODIUM (PORCINE) 5000 UNIT/ML IJ SOLN
5000.0000 [IU] | Freq: Three times a day (TID) | INTRAMUSCULAR | Status: AC
  Administered 2023-04-28: 5000 [IU] via SUBCUTANEOUS
  Filled 2023-04-28: qty 1

## 2023-04-28 MED ORDER — ENOXAPARIN SODIUM 40 MG/0.4ML IJ SOSY
40.0000 mg | PREFILLED_SYRINGE | INTRAMUSCULAR | Status: DC
  Filled 2023-04-28: qty 0.4

## 2023-04-28 NOTE — H&P (Addendum)
Hospital Admission History and Physical Service Pager: [phone]  Patient name: Brooke Castillo Medical record number: [id number] Date of Birth: 1945-09-10 Age: 78 y.o. Gender: female  Primary Care Provider: Glendale Chard, DO Consultants: Cardiology Code Status: Full code Preferred Emergency Contact:   Contact Information     Name Relation Home Work Mobile   Nichols Son [phone]  [phone]   Smith,Bree Daughter [phone]  [phone]      Other Contacts   None on File     Chief Complaint: Palpitations  Assessment and Plan: Brooke Castillo is a 78 y.o. female  PMHx of HTN, HLD, GERD presenting with fatigue and lightheadedness. Differential for presentation of this includes:    Symptomatic Bradycardia, patient experiencing symptoms lightheadedness, palpitations and fatigue. Ongoing for months. EKG in clinic HR in 30s.  Syncope, patient reported several months of lightheadedness. Less suspicious, denies recent falls, dizziness or loss of consciousness.  Assessment & Plan Symptomatic bradycardia Pt reports symptoms of fatigue, lightheadedness and palpitations ongoing the past several months. Also notes intermittent chest pressure that comes and goes. EKG at family center concerning for bradycardia to 30s with 1st degree AV block. Patient currently asymptomatic with stable vitals.  - Admit to FMTS, Dr. Jennette Kettle  - Cardiology consult, to assess for pacemaker  - Continuous cardiac monitoring - ECHO  - AM BMP and CBC   Chronic and Stable Problems:  HTN: Patient normotensive. Hold amlodipine-olmesartan 5- 40 mg daily.   HLD: Continue Home Pravastain 20 mg and fenofibrate 160mg  daily GERD: Not taking meds currently  Type 2 DM: Hold home Semaglutide 0.5 mg every Wednesday  Diet: Carb modified Diet   VTE Prophylaxis: Lovenox   Disposition: Cardiac tele  History of Present Illness:  Brooke Castillo is a 78 y.o. female who presented to family medicine clinic  today for follow-up for pneumonia. Patient reported ~ 8 months with a heart flutter. EKG at clinic was concerning with bradycardia HR 39 and 1 st degree AV block. She was advised to report to the ED for assessment.   She has been sick with flu and pneumonia, causing worsening of symptoms. Associated symptoms include lightheadedness and fatigue. Symptoms occur any time, including when laying in the bed. Symptoms have now occurred every day for the past 3-4 days. Each time it happens it lasts longer. Patient has been compliant with antibiotic (Doxycycline) and has 5 pills left. She's also been taking Mucinex for supportive treatment for occasional cough.   Feels like a balloon, and get "deflated" for a couple hours with all the energy gone. Feels flushed. Reports one night within the last 2 weeks she had an episode of sharp pain behind left breast and felt in her back. Pain was localized and resolved quickly afterwards.    In the ED, CBC and BMP unremarkable. TSH 3.38. Trops 8 and 6. EKG Sinus 62, 1st degree AV block.   Review Of Systems: Per HPI  Pertinent Past Medical History: HTN  HLD  Type 2 DM  GERD  Diverticulosis  Rheumatic fever in childhood Remainder reviewed in history tab.   Pertinent Past Surgical History: Abdominal hysterectomy Appendectomy Cholecystectomy  Colon resection   Remainder reviewed in history tab.  Pertinent Social History: Tobacco use: Never. Second hand exposure to smoke from dad and husband Alcohol use: Rarely, small amount of wine every other month Other Substance use: None Lives with son, daughter in law and granddaughter  Pertinent Family History: Mom Heart Failure, COPD, HTN,  has a pacemaker (had for 13 years, placed around similar age, maybe due to second had smoke?) Father CAD, CABG, brain stream stroke, colon polyps  Sister HTN, DM, Breast Cancer  Brother CAD, Bone Cancer and Prostate Cancer Paternal GM: Aneurysm  Paternal GF: Heart  disease  Remainder reviewed in history tab.   Important Outpatient Medications: *May have taken her medication today  Proventil HFL;Ventolin HFA 108 1-2 puffs q4 hours as need (not used in years) Amlodipine-olmesartan 5-40 mg daily  Cetirizine 1 tablet every other day  Vitamin D 1000 units daily  Fenofibrate 160 mg tdaily  Flutiasone 50 mcg (not often uses) Furosemide 40 mg daily as needed (taken 2/9, increase 5 pounds prior to that time). Taken only twice Meclizine 12.5 mg TID as needed (none recently) Nystatin powder BID daily  Pravastain 20 mg at bedtime  Semalutide 0.5 mg once weekly (takes on Wednesday) Remainder reviewed in medication history.   Objective: BP (!) 158/70   Pulse 62   Temp 97.7 F (36.5 C)   Resp 14   Ht 5\' 3"  (1.6 m)   Wt 85.3 kg   SpO2 97%   BMI 33.30 kg/m  Exam: General: NAD, cooperative,  Eyes: Non scleral ictera Neck: No JVD noted  Cardiovascular: RRR, No murmur noted  Respiratory: CTAB in posterior lung fields, NWOB  Gastrointestinal: Non-tender to palpation, normoactive bowel sounds,  MSK: Normal range of motion  Derm: No skin lesions, ulcers or abscess noted Neuro: Alert and Oriented, no focal deficits  Psych: Pleasant, cooperative,   Labs:  CBC BMET  Recent Labs  Lab 04/28/23 1222  WBC 7.5  HGB 15.4*  HCT 44.8  PLT 251   Recent Labs  Lab 04/28/23 1222  NA 137  K 4.1  CL 103  CO2 22  BUN 15  CREATININE 0.73  GLUCOSE 128*  CALCIUM 9.4    Pertinent additional labs: Troponin: 6 and 8   EKG:   FMTS: EKG Sinus, 62 BPM and 1st degree AV block   Imaging Studies Performed:  CXR 2 view:   IMPRESSION: No active cardiopulmonary disease.  Peterson Ao, MD 04/28/2023, 3:11 PM PGY-1, [facility]  FPTS Intern pager: [phone], text pages welcome Secure chat group [facility] Teaching Service   Upper Level Addendum:   I have seen and evaluated this patient along with Dr. Birdena Crandall and  reviewed the above note, making necessary revisions as appropriate.  I agree with the medical decision making and physical exam as noted above.   Elberta Fortis, DO PGY-2, [facility] Family [facility] Residency

## 2023-04-28 NOTE — Telephone Encounter (Signed)
Dr. Hyacinth Meeker wants a consult regarding patient's EKG results.

## 2023-04-28 NOTE — Progress Notes (Signed)
    SUBJECTIVE:   CHIEF COMPLAINT / HPI:   Brooke Castillo is a 78 y.o. female  presenting for follow-up.  Palpitations: Patient reports having intermittent palpitations for the last 2 months with increased frequency when she became ill with flu which developed into pneumonia.  She is still on antibiotics for the pneumonia she will complete those in 3 days.  She reports having lightheadedness and fatigue.  She describes the palpitations as a flutter with her midsternal area.  They happen while she is sitting and not with exertion.  She reports having palpitation 5 out of 7 days this week.  Has had no loss of consciousness.  PERTINENT  PMH / PSH: Reviewed and updated   OBJECTIVE:   BP (!) 151/80   Pulse 66   Ht 5\' 3"  (1.6 m)   Wt 188 lb 8 oz (85.5 kg)   SpO2 99%   BMI 33.39 kg/m   Chronically ill-appearing, no acute distress Cardio: Bradycardic, irregular, no murmurs appreciated.  +2 radial pulses Pulm: Clear, no wheezing, no crackles. No increased work of breathing Abdominal: bowel sounds present, soft, non-tender, non-distended Extremities: no peripheral edema  Neuro: alert and oriented x3, speech normal in content, no facial asymmetry, strength intact and equal bilaterally in UE and LE, pupils equal and reactive to light.  Psych:  Cognition and judgment appear intact. Alert, communicative  and cooperative with normal attention span and concentration. No apparent delusions, illusions, hallucinations      04/28/2023    9:53 AM 04/09/2023    3:06 PM 02/04/2023    9:15 AM  PHQ9 SCORE ONLY  PHQ-9 Total Score 0 0 0    EKG: sinus bradycardia, 1st degree AV block, LBBB  ASSESSMENT/PLAN:   Palpitations EKG findings concerning for bradycardia with HR 39, 1st degree AV block. Called DOD on-call at [facility] cardiology who reviewed the EKG and recommended with patient's symptoms and EKG findings an ED referral for possible pacemaker placement.  Discussed with patient that she is in  agreement with plan.      Glendale Chard, DO [employer] [facility]

## 2023-04-28 NOTE — ED Notes (Signed)
Assumed pt care.

## 2023-04-28 NOTE — ED Triage Notes (Signed)
Patient went for follow up post PNA and fatigue.  Patient  had EKG at MD office which was extremely brady and her PCP consulted Dr Gery Pray and said she needed to come to ER for poss pacemaker. Patient denies pain just feels weak and fatigue.

## 2023-04-28 NOTE — Assessment & Plan Note (Addendum)
Pt reports symptoms of fatigue, lightheadedness and palpitations ongoing the past several months. Also notes intermittent chest pressure that comes and goes. EKG at family center concerning for bradycardia to 30s with 1st degree AV block. Patient currently asymptomatic with stable vitals.  - Admit to FMTS, Dr. Jennette Kettle  - Cardiology consult, to assess for pacemaker  - Continuous cardiac monitoring - ECHO  - AM BMP and CBC   Chronic and Stable Problems:  HTN: Patient normotensive. Hold amlodipine-olmesartan 5- 40 mg daily.   HLD: Continue Home Pravastain 20 mg and fenofibrate 160mg  daily GERD: Not taking meds currently  Type 2 DM: Hold home Semaglutide 0.5 mg every Wednesday  Diet: Carb modified Diet   VTE Prophylaxis: Lovenox

## 2023-04-28 NOTE — Plan of Care (Signed)
FMTS Brief Progress Note  S: Patient feeling well sitting up in bed on exam this evening.  She reports no further episodes of palpitations or chest pain, however she did experience 1 episode of dizziness related to her bradycardia while speaking with the electrophysiologist earlier in the evening.   O: BP (!) 182/72   Pulse 68   Temp 97.7 F (36.5 C)   Resp 16   Ht 5\' 3"  (1.6 m)   Wt 85.3 kg   SpO2 98%   BMI 33.30 kg/m   General: Well-appearing elderly female, no distress Cardiac: Intermittent profound bradycardia.  No M/R/G.  Patient remains conscious and talking throughout these episodes Respiratory: CTAB, no increased work of breathing Extremities: 2+ peripheral pulses in all 4 extremities  A/P: Patient is followed by EP inpatient and will likely undergo pacemaker placement in the AM. Changed DVT prophylaxis to heparin, and will hold this and make NPO at [facility].  Echo is still pending, cardiology will make final decision on pacemaker placement after seeing echocardiogram results. - Orders reviewed. Labs for AM ordered, which was adjusted as needed.  - If condition changes, plan includes rapid reassessment with any changes to treatment based on patient presentation and need.   Gerrit Heck, DO 04/28/2023, 7:24 PM PGY-1,  Family Medicine Night Resident  Please page [phone] with questions.

## 2023-04-28 NOTE — Telephone Encounter (Signed)
Form completed and given to patient at visit on 04/28/23   Glendale Chard, DO [facility], PGY-2 04/28/23 9:52 AM

## 2023-04-28 NOTE — Patient Instructions (Addendum)
Patient is being sent to the ED due to lightheadedness and fatigue has been present worse over the last few weeks has progressed.  She had an EKG done in our office today which showed sinus bradycardia with a ventricular rate of 39 bpm with a first-degree AV block and left bundle block.   Called DOD on-call at [facility] cardiology who recommended ED for possible pacemaker placement.

## 2023-04-28 NOTE — ED Notes (Signed)
Attending at bedside.

## 2023-04-28 NOTE — ED Notes (Signed)
Family medicine at bedside.

## 2023-04-28 NOTE — ED Provider Notes (Signed)
[place] [facility]MENT AT [facility] Provider Note   CSN: [id number] Arrival date & time: 04/28/23  1201     History  Chief Complaint  Patient presents with   Fatigue   Bradycardia    Brooke Castillo is a 78 y.o. female.  HPI Patient presents with her son who assists with history.  She initially went to family practice clinic/primary care and was sent here.  She notes notable recent history of influenza.  Over the past few weeks that she has recovered she has had multiple periods of fatigue, now consistent, as well as chest pressure, also now consistent.    Home Medications Prior to Admission medications   Medication Sig Start Date End Date Taking? Authorizing Provider  albuterol (PROVENTIL HFA;VENTOLIN HFA) 108 (90 BASE) MCG/ACT inhaler Inhale 1-2 puffs into the lungs every 4 (four) hours as needed for wheezing or shortness of breath.    [provider]  amLODipine-olmesartan (AZOR) 5-40 MG tablet TAKE 1 TABLET BY MOUTH DAILY 01/12/23   Glendale Chard, DO  cetirizine (ZYRTEC) 10 MG tablet Take 1 tablet by mouth every other day. 01/03/16   [provider]  cholecalciferol (VITAMIN D) 1000 units tablet Take 1,000 Units by mouth daily.    [provider]  diclofenac Sodium (VOLTAREN) 1 % GEL APPLY TO AFFECTED JOINT UP TO 4 TIMES DAILY 02/13/22   [provider]  doxycycline (VIBRAMYCIN) 100 MG capsule Take 1 capsule (100 mg total) by mouth 2 (two) times daily. 04/24/23   Particia Nearing, PA-C  EPINEPHrine 0.3 mg/0.3 mL IJ SOAJ injection Use as directed for life-threatening allergic reaction. Patient taking differently: Inject 0.3 mg into the muscle once as needed (for life-threatening allergic reaction). 01/31/16   Kozlow, Alvira Philips, MD  fenofibrate 160 MG tablet Take 160 mg by mouth daily.    [provider]  fluticasone (FLONASE) 50 MCG/ACT nasal spray Place into the nose. 03/28/21   [provider]   furosemide (LASIX) 40 MG tablet TAKE 1 TABLET(40 MG) BY MOUTH DAILY as needed for swelling 02/18/23   Celine Mans, MD  glucose blood (CONTOUR TEST) test strip Please use to check blood sugar levels up to three times daily. 06/10/22   Glendale Chard, DO  Magnesium Gluconate 27.5 MG TABS Take by mouth. 12/28/19   [provider]  meclizine (ANTIVERT) 12.5 MG tablet Take 1 tablet (12.5 mg total) by mouth 3 (three) times daily as needed for dizziness. 09/22/22   Elberta Fortis, MD  nystatin (MYCOSTATIN/NYSTOP) powder Apply 2 times a day as needed 06/19/21   [provider]  pravastatin (PRAVACHOL) 20 MG tablet TAKE 1 TABLET(20 MG) BY MOUTH AT BEDTIME 04/21/23   Glendale Chard, DO  Semaglutide,0.25 or 0.5MG /DOS, 2 MG/3ML SOPN Inject 0.5 mg into the skin once a week. 01/09/23   Elberta Fortis, MD      Allergies    Bee venom, Celecoxib, Ciprofloxacin, Codeine, Diflucan [fluconazole], Ezetimibe, Flagyl [metronidazole], Niaspan [niacin er (antihyperlipidemic)], and Statins    Review of Systems   Review of Systems  Physical Exam Updated Vital Signs BP (!) 147/63   Pulse 68   Temp 97.7 F (36.5 C)   Resp 17   Ht 5\' 3"  (1.6 m)   Wt 85.3 kg   SpO2 97%   BMI 33.30 kg/m  Physical Exam Vitals and nursing note reviewed.  Constitutional:      General: She is not in acute distress.    Appearance: She is well-developed.  HENT:     Head: Normocephalic and atraumatic.  Eyes:     Conjunctiva/sclera: Conjunctivae normal.  Cardiovascular:     Rate and Rhythm: Normal rate and regular rhythm.  Pulmonary:     Effort: Pulmonary effort is normal. No respiratory distress.     Breath sounds: Normal breath sounds. No stridor.  Abdominal:     General: There is no distension.  Skin:    General: Skin is warm and dry.  Neurological:     Mental Status: She is alert and oriented to person, place, and time.     Cranial Nerves: No cranial nerve deficit.  Psychiatric:        Mood and  Affect: Mood normal.     ED Results / Procedures / Treatments   Labs (all labs ordered are listed, but only abnormal results are displayed) Labs Reviewed  CBC WITH DIFFERENTIAL/PLATELET - Abnormal; Notable for the following components:      Result Value   Hemoglobin 15.4 (*)    All other components within normal limits  BASIC METABOLIC PANEL  TROPONIN I (HIGH SENSITIVITY)    EKG None  Radiology No results found.  Procedures Procedures    Medications Ordered in ED Medications - No data to display  ED Course/ Medical Decision Making/ A&P                                 Medical Decision Making Patient with recent influenza illness presents with fatigue, with malaise.  Patient was seen at primary care prior to ED arrival, and there she was found to have bradycardia and sent here for evaluation. Suspicion for idiopathic bradycardia, though postviral is an additional consideration.  Patient does have chest pain ACS considered as well, but the patient has no distress, and initial EKG is nonischemic. Patient monitored, labs sent, and I discussed her case with her cardiology colleagues to follow as a consulting service. Cardiac 65 sinus normal Pulse ox 97% room air normal  Amount and/or Complexity of Data Reviewed Independent Historian:     Details: Son at bedside External Data Reviewed: notes.    Details: Notes from this morning's evaluation Labs: ordered. Decision-making details documented in ED Course. Radiology: ordered and independent interpretation performed. Decision-making details documented in ED Course. ECG/medicine tests: independent interpretation performed. Decision-making details documented in ED Course.  Risk Prescription drug management. Decision regarding hospitalization. Diagnosis or treatment significantly limited by social determinants of health.  Pacer pads to bedside after initial eval   1:31 PM Patient hemodynamically about the same.  Initial  labs unremarkable, I discussed her case with her cardiology colleagues.  On reviewing the EKG from this morning's visit patient had notable bradycardia, with intraventricular conduction delay.  Recommendation at that point was to proceed to the ED, for cardiology eval. additional labs pending, but given concern for symptomatic bradycardia in the context of recent flu patient will require admission for further monitoring, management.  Cardiology will follow as a consulting service.   Final Clinical Impression(s) / ED Diagnoses Final diagnoses:  Symptomatic bradycardia    Rx / DC Orders ED Discharge Orders     None         Gerhard Munch, MD 04/28/23 1435

## 2023-04-28 NOTE — Hospital Course (Signed)
Pt assess for fatigue, weakness and palpitations.

## 2023-04-28 NOTE — Progress Notes (Unsigned)
EP to read.

## 2023-04-28 NOTE — Consult Note (Cosign Needed)
Cardiology Consultation   Patient ID: Brooke Castillo MRN: [id number]; DOB: 1946/01/06  Admit date: 04/28/2023 Date of Consult: 04/28/2023  PCP:  Glendale Chard, DO   [facility] Providers Cardiologist: Dr. Rennis Golden [phone]   Patient Profile:   Brooke Castillo is a 78 y.o. female with a hx of HTN, HLD, DM, asthma (perhaps), no known CAD,  LBBB who is being seen 04/28/2023 for the evaluation of bradycardia at the request of Dr. Birdena Crandall.  History of Present Illness:   Brooke Castillo was at her PMD office today for f/u after having had the flu (diagnosed 04/06/23) > that developed into PNA, antibiotics not quite completed with 3 more days to go. Mentions some fluttering in her chest over the last week, typically noted at rest, no syncope reported EKG was done noting bradycardia at 39bpm.   Reached out to DOD at [place] Dr. Allyson Sabal recommended she be sent to the ER for evaluation and likely PPM  In the ER she also reported fatigue, EKG here in the 60's, pt reported since her flu diagnosis feeling progressively fatigued, weak with a component of chest pressure that had become consistent EKG without clear ischemic changes BP stable IM admitted with cardiology consulting With no clear reversible causes for her bradycardia, cardiology service enlisted EP to see her.  LABS K+ 4.1 BUN/Creat 15/0.73 HS Trop 6 > 8 WBC 7.5 H/H 15/44 Plts 251 TSH 3.381  She reports years of infrequent flutters, as well as perhaps dizzy spells, though not related to each other and not often enough to really have noted pattern, trigger, correlation. Since she had the flu that she was very ill with for a week, sand had started to feel the flutters more frequently she has generally felt poorly, weak, fatigued, only of late feeling like she had finally started to turn towards improvement (after ABX for PNA)  When at her PMD today she mentioned the flutters > EKG done  She reports the sense of her heart  beat in her throat/high up in her chest, and a sense that some one is laying their hand on her chest > not pressure, not an elephant and not painful. The flutters have been associate with a fleeting sense of lightheaded She has not had syncope   Past Medical History:  Diagnosis Date   Complication of anesthesia    IN 2011 I HAD COLON SURGERY & IT TOOK ME 2 DAYS TO WAKE UP    Diverticulosis    DM2 (diabetes mellitus, type 2) (HCC)    A1c in 12/16 was 6.6   GERD (gastroesophageal reflux disease)    Hx of colonic polyps    Hymenoptera allergy    LBBB (left bundle branch block)    transient   Obesity    Rheumatic fever    as a child    Past Surgical History:  Procedure Laterality Date   ABDOMINAL HYSTERECTOMY  1980   complete   APPENDECTOMY  1980   CHOLECYSTECTOMY  1989   COLON SURGERY  2011   removed 11 inches of colon   COLONOSCOPY  08/07/2008   Moderate sigmoid divertiuclosis. Small internal hemorrhoids. Otherwise normal colonoscopy   COLONOSCOPY     around 2017 with Dr Noe Gens at [facility]   KNEE SURGERY Bilateral 2009   SINOSCOPY     TONSILLECTOMY       Home Medications:  Prior to Admission medications   Medication Sig Start Date End Date Taking? Authorizing  Provider  albuterol (PROVENTIL HFA;VENTOLIN HFA) 108 (90 BASE) MCG/ACT inhaler Inhale 1-2 puffs into the lungs every 4 (four) hours as needed for wheezing or shortness of breath.   Yes [provider]  amLODipine-olmesartan (AZOR) 5-40 MG tablet TAKE 1 TABLET BY MOUTH DAILY 01/12/23  Yes Glendale Chard, DO  cetirizine (ZYRTEC) 10 MG tablet Take 1 tablet by mouth daily. 01/03/16  Yes [provider]  cholecalciferol (VITAMIN D) 1000 units tablet Take 1,000 Units by mouth daily.   Yes [provider]  diclofenac Sodium (VOLTAREN) 1 % GEL Apply 2 g topically 4 (four) times daily. 02/13/22  Yes [provider]  doxycycline (VIBRAMYCIN) 100 MG capsule Take 1 capsule (100 mg  total) by mouth 2 (two) times daily. 04/24/23  Yes Particia Nearing, PA-C  EPINEPHrine 0.3 mg/0.3 mL IJ SOAJ injection Use as directed for life-threatening allergic reaction. Patient taking differently: Inject 0.3 mg into the muscle once as needed (for life-threatening allergic reaction). 01/31/16  Yes Kozlow, Alvira Philips, MD  fenofibrate 160 MG tablet Take 160 mg by mouth every evening.   Yes [provider]  fluticasone (FLONASE) 50 MCG/ACT nasal spray Place 1-2 sprays into the nose daily. 03/28/21  Yes [provider]  furosemide (LASIX) 40 MG tablet TAKE 1 TABLET(40 MG) BY MOUTH DAILY as needed for swelling Patient taking differently: Take 40 mg by mouth daily as needed for fluid. 02/18/23  Yes Celine Mans, MD  Magnesium Gluconate 27.5 MG TABS Take by mouth. 12/28/19  Yes [provider]  meclizine (ANTIVERT) 12.5 MG tablet Take 1 tablet (12.5 mg total) by mouth 3 (three) times daily as needed for dizziness. 09/22/22  Yes Elberta Fortis, MD  nystatin (MYCOSTATIN/NYSTOP) powder Apply 1 Application topically 2 (two) times daily as needed. 06/19/21  Yes [provider]  pravastatin (PRAVACHOL) 20 MG tablet TAKE 1 TABLET(20 MG) BY MOUTH AT BEDTIME Patient taking differently: Take 20 mg by mouth at bedtime. 04/21/23  Yes Glendale Chard, DO  Semaglutide,0.25 or 0.5MG /DOS, 2 MG/3ML SOPN Inject 0.5 mg into the skin once a week. 01/09/23  Yes Elberta Fortis, MD  glucose blood (CONTOUR TEST) test strip Please use to check blood sugar levels up to three times daily. 06/10/22   Glendale Chard, DO    Inpatient Medications: Scheduled Meds:  enoxaparin (LOVENOX) injection  40 mg Subcutaneous Q24H   fenofibrate  160 mg Oral QPM   pravastatin  20 mg Oral QHS   Continuous Infusions:  PRN Meds:   Allergies:    Allergies  Allergen Reactions   Bee Venom Anaphylaxis and Shortness Of Breath    Within minutes pt feels swelling and SOB, has been to ER for reaction    Celecoxib Other (See Comments)    SOB, weight gain      Ciprofloxacin Hives    Denies airway involvement   Codeine Other (See Comments)    "knocks me out", CNS change    Diflucan [Fluconazole] Hives    Denies airway involvement   Ezetimibe Other (See Comments)    Caused leg swelling and flushing     Flagyl [Metronidazole] Palpitations   Niaspan [Niacin Er (Antihyperlipidemic)] Other (See Comments)    Feels "flush" badly   Statins Other (See Comments)    Achey, muscle pain    Social History:   Social History   Socioeconomic History   Marital status: Widowed    Spouse name: Not on file   Number of children: 2   Years of education:  Not on file   Highest education level: Not on file  Occupational History   Occupation: Retired   Tobacco Use   Smoking status: Never    Passive exposure: Past   Smokeless tobacco: Never  Vaping Use   Vaping status: Never Used  Substance and Sexual Activity   Alcohol use: No   Drug use: No   Sexual activity: Not on file  Other Topics Concern   Not on file  Social History Narrative   Lives with son, [name], and 2 dogs   Reports son, [name], will be the one to make medical decisions for her if she was unable to do so   Social Drivers of [employer]: Low Risk  (07/25/2022)   Overall Financial Resource Strain (CARDIA)    Difficulty of Paying Living Expenses: Not hard at all  Food Insecurity: No Food Insecurity (04/28/2023)   Hunger Vital Sign    Worried About Running Out of Food in the Last Year: Never true    Ran Out of Food in the Last Year: Never true  Transportation Needs: No Transportation Needs (04/28/2023)   PRAPARE - Administrator, Civil Service (Medical): No    Lack of Transportation (Non-Medical): No  Physical Activity: Insufficiently Active (07/25/2022)   Exercise Vital Sign    Days of Exercise per Week: 3 days    Minutes of Exercise per Session: 30 min  Stress: No Stress Concern Present  (07/25/2022)   [employer] of Occupational Health - Occupational Stress Questionnaire    Feeling of Stress : Not at all  Social Connections: Moderately Integrated (04/28/2023)   Social Connection and Isolation Panel [NHANES]    Frequency of Communication with Friends and Family: More than three times a week    Frequency of Social Gatherings with Friends and Family: More than three times a week    Attends Religious Services: More than 4 times per year    Active Member of [employer] or Organizations: Yes    Attends Banker Meetings: More than 4 times per year    Marital Status: Widowed  Intimate Partner Violence: Not At Risk (04/28/2023)   Humiliation, Afraid, Rape, and Kick questionnaire    Fear of Current or Ex-Partner: No    Emotionally Abused: No    Physically Abused: No    Sexually Abused: No    Family History:   Family History  Problem Relation Age of Onset   Heart failure Mother    COPD Mother    Hypertension Mother    Coronary artery disease Father 51       CABGx5; brain stem stroke   Colon polyps Father    Hypertension Sister    Diabetes Sister    Breast cancer Sister    CAD Brother 36       stent   Bone cancer Brother    Prostate cancer Brother    Aneurysm Paternal Grandmother    Heart disease Paternal Grandfather    Colon cancer Neg Hx    Esophageal cancer Neg Hx    Stomach cancer Neg Hx    Rectal cancer Neg Hx      ROS:  Please see the history of present illness.  All other ROS reviewed and negative.     Physical Exam/Data:   Vitals:   04/28/23 1330 04/28/23 1338 04/28/23 1615 04/28/23 1745  BP: (!) 158/70  137/70 (!) 147/82  Pulse: 63 62 (!) 56 (!) 59  Resp: 15  14 14 19   Temp:      SpO2: 99% 97% 96% 99%  Weight:      Height:       No intake or output data in the 24 hours ending 04/28/23 1751    04/28/2023   12:19 PM 04/28/2023    9:53 AM 04/09/2023    3:06 PM  Last 3 Weights  Weight (lbs) 188 lb 188 lb 8 oz 189 lb 8 oz  Weight (kg)  85.276 kg 85.503 kg 85.957 kg     Body mass index is 33.3 kg/m.  General:  Well nourished, well developed, in no acute distress HEENT: normal Neck: no JVD Vascular: No carotid bruits; Distal pulses 2+ bilaterally Cardiac:  RRR; no murmurs, gallops or rubs  Lungs:  CTA b/l, no wheezing, rhonchi or rales  Abd: soft, nontender, no hepatomegaly  Ext: no edema Musculoskeletal:  No deformities, BUE and BLE strength normal and equal Skin: warm and dry  Neuro:  CNs 2-12 intact, no focal abnormalities noted Psych:  Normal affect   EKG:  The EKG was personally reviewed and demonstrates:    Media tab: EKG dated 04/28/23: SSB/sinus arrhythmia, LBBB, LAD, 1st degree AVblock  Here: SR 62bpm, LBBB, 1st degree AVBlock 22ms  OLD SR 63bpm, IVCD   Telemetry:  Telemetry was personally reviewed and demonstrates:   SR/SB 50's-60's, 2 very short sinus pauses approx 2 seconds  Relevant CV Studies:  Echo ordered  2017 TTE LVEF 55-60%  Laboratory Data:  High Sensitivity Troponin:   Recent Labs  Lab 04/28/23 1222 04/28/23 1332  TROPONINIHS 6 8     Chemistry Recent Labs  Lab 04/28/23 1222  NA 137  K 4.1  CL 103  CO2 22  GLUCOSE 128*  BUN 15  CREATININE 0.73  CALCIUM 9.4  GFRNONAA >60  ANIONGAP 12    No results for input(s): "PROT", "ALBUMIN", "AST", "ALT", "ALKPHOS", "BILITOT" in the last 168 hours. Lipids No results for input(s): "CHOL", "TRIG", "HDL", "LABVLDL", "LDLCALC", "CHOLHDL" in the last 168 hours.  Hematology Recent Labs  Lab 04/28/23 1222  WBC 7.5  RBC 4.94  HGB 15.4*  HCT 44.8  MCV 90.7  MCH 31.2  MCHC 34.4  RDW 12.8  PLT 251   Thyroid  Recent Labs  Lab 04/28/23 1332  TSH 3.381    BNPNo results for input(s): "BNP", "PROBNP" in the last 168 hours.  DDimer No results for input(s): "DDIMER" in the last 168 hours.   Radiology/Studies:  DG Chest 2 View Result Date: 04/28/2023 CLINICAL DATA:  brady.  Bradycardia.  Fatigue.  Follow-up pneumonia.  EXAM: CHEST - 2 VIEW COMPARISON:  12/09/2016. FINDINGS: Bilateral lung fields are clear. Bilateral costophrenic angles are clear. Normal cardio-mediastinal silhouette. No acute osseous abnormalities. The soft tissues are within normal limits. IMPRESSION: No active cardiopulmonary disease. Electronically Signed   By: Jules Schick M.D.   On: 04/28/2023 15:28     Assessment and Plan:   SB Sinus arrhythmia LBBB (suspect is baseline with nsIVCD back in 2019  Initially discussed plan to monitor overnight and if no abnormal tele and pending her echo, likely d/c with monitoring that she was glad to hear As we are bedside she mentions she is having a episode of lightheadedness, like she just got off a carnival ride > fleeting and associated with transient SB/and back>back short pauses  No nodal blocking agents  Unremarkable labs Suspect she may end up with a pacer Echo has been ordered NPO after MN  Dr. Elberta Fortis has been bedside   Risk Assessment/Risk Scores:    For questions or updates, please contact [facility] Please consult [email or web address] for contact info under    Signed, Sheilah Pigeon, PA-C  04/28/2023 5:51 PM  I have seen and examined this patient with Francis Dowse.  Agree with above, note added to reflect my findings.  Patient with a history of hypertension, hyperlipidemia, diabetes, left bundle branch block presented to the hospital after being seen by her primary physician with palpitations.  She has had no syncope.  She has had some fluttering and dizziness.  EKG showed sinus rhythm with intermittent sinus pauses.  Throughout her time in the emergency room, she continued to have sinus pauses, and did state that her symptoms occurred during these times.  When she is in normal rhythm, she feels well and is without complaint.  Of note, she has recently had the flu and is on the end of her antibiotics for pneumonia.  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  Monitor overnight.  If these continue and she is symptomatic, will likely need pacemaker implant.  If not, could potentially be discharged home with cardiac monitoring. Left bundle branch block: Has been known for quite some time.  Will plan for transthoracic echo. Hypertension: Per primary team  Will M. Camnitz MD 04/29/2023 10:25 AM

## 2023-04-28 NOTE — Telephone Encounter (Signed)
Dr. Hyacinth Meeker calling to talk to Dr. Allyson Sabal (DOD) regarding EKG and current symptoms while in her office today. Pt is being seen and is feeling fatigued and weak. EKG placed under media per Dr. Hyacinth Meeker. EKG reviewed by Dr. Allyson Sabal, pt is not on any negative chronotropic medications. Per Dr. Allyson Sabal, pt should present to the ED for low heart rate that will possibly need a pacemaker in the near future.

## 2023-04-28 NOTE — Assessment & Plan Note (Addendum)
EKG findings concerning for bradycardia with HR 39, 1st degree AV block. Called DOD on-call at [facility] cardiology who reviewed the EKG and recommended with patient's symptoms and EKG findings an ED referral for possible pacemaker placement.  Discussed with patient that she is in agreement with plan.

## 2023-04-28 NOTE — ED Notes (Signed)
Cards to bedside

## 2023-04-29 ENCOUNTER — Inpatient Hospital Stay (HOSPITAL_COMMUNITY): Payer: Medicare Other

## 2023-04-29 ENCOUNTER — Inpatient Hospital Stay (HOSPITAL_BASED_OUTPATIENT_CLINIC_OR_DEPARTMENT_OTHER)
Admit: 2023-04-29 | Discharge: 2023-04-29 | Disposition: A | Discharge status: Home / Self Care | Payer: Medicare Other | Attending: Physician Assistant | Admitting: Physician Assistant

## 2023-04-29 ENCOUNTER — Encounter (HOSPITAL_COMMUNITY)
Admission: EM | Disposition: A | Discharge status: Home / Self Care | Payer: Self-pay | Source: Home / Self Care | Attending: Family Medicine

## 2023-04-29 DIAGNOSIS — E66811 Obesity, class 1: Secondary | ICD-10-CM | POA: Diagnosis not present

## 2023-04-29 DIAGNOSIS — R001 Bradycardia, unspecified: Secondary | ICD-10-CM | POA: Diagnosis not present

## 2023-04-29 DIAGNOSIS — R42 Dizziness and giddiness: Secondary | ICD-10-CM

## 2023-04-29 DIAGNOSIS — Z7985 Long-term (current) use of injectable non-insulin antidiabetic drugs: Secondary | ICD-10-CM | POA: Diagnosis not present

## 2023-04-29 DIAGNOSIS — R002 Palpitations: Secondary | ICD-10-CM | POA: Diagnosis not present

## 2023-04-29 DIAGNOSIS — Z83719 Family history of colon polyps, unspecified: Secondary | ICD-10-CM | POA: Diagnosis not present

## 2023-04-29 DIAGNOSIS — Z6833 Body mass index (BMI) 33.0-33.9, adult: Secondary | ICD-10-CM | POA: Diagnosis not present

## 2023-04-29 DIAGNOSIS — E785 Hyperlipidemia, unspecified: Secondary | ICD-10-CM | POA: Diagnosis not present

## 2023-04-29 DIAGNOSIS — Z881 Allergy status to other antibiotic agents status: Secondary | ICD-10-CM | POA: Diagnosis not present

## 2023-04-29 DIAGNOSIS — J45909 Unspecified asthma, uncomplicated: Secondary | ICD-10-CM | POA: Diagnosis not present

## 2023-04-29 DIAGNOSIS — Z803 Family history of malignant neoplasm of breast: Secondary | ICD-10-CM | POA: Diagnosis not present

## 2023-04-29 DIAGNOSIS — Z90710 Acquired absence of both cervix and uterus: Secondary | ICD-10-CM | POA: Diagnosis not present

## 2023-04-29 DIAGNOSIS — Z833 Family history of diabetes mellitus: Secondary | ICD-10-CM | POA: Diagnosis not present

## 2023-04-29 DIAGNOSIS — E119 Type 2 diabetes mellitus without complications: Secondary | ICD-10-CM | POA: Diagnosis not present

## 2023-04-29 DIAGNOSIS — I441 Atrioventricular block, second degree: Secondary | ICD-10-CM

## 2023-04-29 DIAGNOSIS — I5030 Unspecified diastolic (congestive) heart failure: Secondary | ICD-10-CM | POA: Diagnosis not present

## 2023-04-29 DIAGNOSIS — I440 Atrioventricular block, first degree: Secondary | ICD-10-CM | POA: Diagnosis not present

## 2023-04-29 DIAGNOSIS — Z8042 Family history of malignant neoplasm of prostate: Secondary | ICD-10-CM | POA: Diagnosis not present

## 2023-04-29 DIAGNOSIS — I110 Hypertensive heart disease with heart failure: Secondary | ICD-10-CM | POA: Diagnosis not present

## 2023-04-29 DIAGNOSIS — Z860100 Personal history of colon polyps, unspecified: Secondary | ICD-10-CM | POA: Diagnosis not present

## 2023-04-29 DIAGNOSIS — K219 Gastro-esophageal reflux disease without esophagitis: Secondary | ICD-10-CM | POA: Diagnosis not present

## 2023-04-29 DIAGNOSIS — Z825 Family history of asthma and other chronic lower respiratory diseases: Secondary | ICD-10-CM | POA: Diagnosis not present

## 2023-04-29 DIAGNOSIS — E669 Obesity, unspecified: Secondary | ICD-10-CM | POA: Diagnosis not present

## 2023-04-29 DIAGNOSIS — J1100 Influenza due to unidentified influenza virus with unspecified type of pneumonia: Secondary | ICD-10-CM | POA: Diagnosis not present

## 2023-04-29 DIAGNOSIS — Z823 Family history of stroke: Secondary | ICD-10-CM | POA: Diagnosis not present

## 2023-04-29 DIAGNOSIS — Z91030 Bee allergy status: Secondary | ICD-10-CM | POA: Diagnosis not present

## 2023-04-29 DIAGNOSIS — I5031 Acute diastolic (congestive) heart failure: Secondary | ICD-10-CM | POA: Diagnosis not present

## 2023-04-29 DIAGNOSIS — Z8249 Family history of ischemic heart disease and other diseases of the circulatory system: Secondary | ICD-10-CM | POA: Diagnosis not present

## 2023-04-29 LAB — BASIC METABOLIC PANEL
Anion gap: 9 (ref 5–15)
BUN: 15 mg/dL (ref 8–23)
CO2: 24 mmol/L (ref 22–32)
Calcium: 9.2 mg/dL (ref 8.9–10.3)
Chloride: 104 mmol/L (ref 98–111)
Creatinine, Ser: 0.68 mg/dL (ref 0.44–1.00)
GFR, Estimated: >60 mL/min (ref >60)
Glucose, Bld: 118 mg/dL — ABNORMAL HIGH (ref 70–99)
Potassium: 3.5 mmol/L (ref 3.5–5.1)
Sodium: 137 mmol/L (ref 135–145)

## 2023-04-29 LAB — ECHOCARDIOGRAM COMPLETE
AR max vel: 1.64 cm2
AV Area VTI: 1.63 cm2
AV Area mean vel: 1.67 cm2
AV Mean grad: 4 mm[Hg]
AV Peak grad: 7.9 mm[Hg]
Ao pk vel: 1.41 m/s
Area-P 1/2: 2.83 cm2
Calc EF: 54.9 %
Height: 63 in
MV VTI: 1.64 cm2
S' Lateral: 3.6 cm
Single Plane A2C EF: 52.2 %
Single Plane A4C EF: 40.6 %
Weight: 2990.4 [oz_av]

## 2023-04-29 LAB — CBC
HCT: 37.5 % (ref 36.0–46.0)
Hemoglobin: 13.1 g/dL (ref 12.0–15.0)
MCH: 30.8 pg (ref 26.0–34.0)
MCHC: 34.9 g/dL (ref 30.0–36.0)
MCV: 88 fL (ref 80.0–100.0)
Platelets: 222 10*3/uL (ref 150–400)
RBC: 4.26 MIL/uL (ref 3.87–5.11)
RDW: 12.8 % (ref 11.5–15.5)
WBC: 8.1 10*3/uL (ref 4.0–10.5)
nRBC: 0 % (ref 0.0–0.2)

## 2023-04-29 LAB — SURGICAL PCR SCREEN
MRSA, PCR: NEGATIVE
Staphylococcus aureus: NEGATIVE

## 2023-04-29 LAB — MRSA NEXT GEN BY PCR, NASAL: MRSA by PCR Next Gen: NOT DETECTED

## 2023-04-29 SURGERY — PACEMAKER IMPLANT

## 2023-04-29 MED ORDER — SODIUM CHLORIDE 0.9 % IV SOLN: 250.0000 mL | INTRAVENOUS | Status: DC

## 2023-04-29 MED ORDER — SODIUM CHLORIDE 0.9% FLUSH: 3.0000 mL | INTRAVENOUS | Status: DC | PRN

## 2023-04-29 MED ORDER — CHLORHEXIDINE GLUCONATE 4 % EX SOLN: 60.0000 mL | Freq: Once | CUTANEOUS | Status: DC

## 2023-04-29 MED ORDER — SODIUM CHLORIDE 0.9% FLUSH: 3.0000 mL | Freq: Two times a day (BID) | INTRAVENOUS | Status: DC

## 2023-04-29 MED ORDER — SODIUM CHLORIDE 0.9% FLUSH
3.0000 mL | Freq: Two times a day (BID) | INTRAVENOUS | Status: DC
  Administered 2023-04-29: 3 mL via INTRAVENOUS

## 2023-04-29 MED ORDER — CEFAZOLIN SODIUM-DEXTROSE 2-4 GM/100ML-% IV SOLN: 2.0000 g | INTRAVENOUS | Status: DC

## 2023-04-29 MED ORDER — CHLORHEXIDINE GLUCONATE 4 % EX SOLN
60.0000 mL | Freq: Once | CUTANEOUS | Status: AC
  Administered 2023-04-29: 4 via TOPICAL

## 2023-04-29 MED ORDER — AMLODIPINE BESYLATE 5 MG PO TABS
5.0000 mg | ORAL_TABLET | Freq: Every day | ORAL | Status: DC
  Administered 2023-04-29: 5 mg via ORAL
  Filled 2023-04-29: qty 1

## 2023-04-29 MED ORDER — SODIUM CHLORIDE 0.9 % IV SOLN
80.0000 mg | INTRAVENOUS | Status: DC
  Filled 2023-04-29: qty 2

## 2023-04-29 NOTE — Discharge Instructions (Signed)
Dear Brooke Castillo,  Thank you for letting us participate in your care. You were hospitalized for Symptomatic bradycardia. You were discharged with a monitor and encouraged to follow up with cardiology.   POST-HOSPITAL & CARE INSTRUCTIONS Please follow up with cardiology to review your monitor Go to your follow up appointments (listed below)   DOCTOR'S APPOINTMENT   Future Appointments  Date Time Provider Department Center  05/28/2023 10:45 AM Mealor, Roberts Gaudy, MD [facility]  06/04/2023 11:15 AM Park Meo, FNP [facility]     Take care and be well!  Family Medicine Teaching Service Inpatient Team [place]  [facility]  [address] [place], [place] [address] [phone]

## 2023-04-29 NOTE — Progress Notes (Cosign Needed Addendum)
Rounding Note    Patient Name: Brooke Castillo Date of Encounter: 04/29/2023  [facility] Cardiologist: None  Subjective   No CP, intermittent dizzy spells through the night and this morning, no SOB  Inpatient Medications    Scheduled Meds:  amLODipine  5 mg Oral Daily   fenofibrate  160 mg Oral QPM   pravastatin  20 mg Oral QHS   sodium chloride flush  3 mL Intravenous Q12H   Continuous Infusions:  PRN Meds:    Vital Signs    Vitals:   04/28/23 2350 04/29/23 0342 04/29/23 0905 04/29/23 0908  BP: (!) 152/67 (!) 161/82 (!) 159/97 (!) 171/87  Pulse: 60 66 60 (!) 58  Resp: 13 20 11 16   Temp:  98.8 F (37.1 C) 97.7 F (36.5 C)   TempSrc:  Oral Oral   SpO2: 95% 95% 98% 96%  Weight:      Height:        Intake/Output Summary (Last 24 hours) at 04/29/2023 1021 Last data filed at 04/28/2023 2330 Gross per 24 hour  Intake 240 ml  Output --  Net 240 ml      04/28/2023   11:10 PM 04/28/2023   12:19 PM 04/28/2023    9:53 AM  Last 3 Weights  Weight (lbs) 186 lb 14.4 oz 188 lb 188 lb 8 oz  Weight (kg) 84.777 kg 85.276 kg 85.503 kg      Telemetry    SR 60's, with recurrent transient sinus pauses>> individually 2-2.5 seconds, though intermittently back to back with rates dipping into the 30's  - Personally Reviewed  ECG    No new EKGs - Personally Reviewed  Physical Exam   GEN: No acute distress.   Neck: No JVD Cardiac: RRR, no murmurs, rubs, or gallops.  Respiratory: CTA b/l GI: Soft, nontender, non-distended  MS: No edema; No deformity. Neuro:  Nonfocal  Psych: Normal affect   Labs    High Sensitivity Troponin:   Recent Labs  Lab 04/28/23 1222 04/28/23 1332  TROPONINIHS 6 8     Chemistry Recent Labs  Lab 04/28/23 1222 04/29/23 0234  NA 137 137  K 4.1 3.5  CL 103 104  CO2 22 24  GLUCOSE 128* 118*  BUN 15 15  CREATININE 0.73 0.68  CALCIUM 9.4 9.2  GFRNONAA >60 >60  ANIONGAP 12 9    Lipids No results for input(s): "CHOL",  "TRIG", "HDL", "LABVLDL", "LDLCALC", "CHOLHDL" in the last 168 hours.  Hematology Recent Labs  Lab 04/28/23 1222 04/29/23 0234  WBC 7.5 8.1  RBC 4.94 4.26  HGB 15.4* 13.1  HCT 44.8 37.5  MCV 90.7 88.0  MCH 31.2 30.8  MCHC 34.4 34.9  RDW 12.8 12.8  PLT 251 222   Thyroid  Recent Labs  Lab 04/28/23 1332  TSH 3.381    BNPNo results for input(s): "BNP", "PROBNP" in the last 168 hours.  DDimer No results for input(s): "DDIMER" in the last 168 hours.   Radiology    DG Chest 2 View Result Date: 04/28/2023 CLINICAL DATA:  brady.  Bradycardia.  Fatigue.  Follow-up pneumonia. EXAM: CHEST - 2 VIEW COMPARISON:  12/09/2016. FINDINGS: Bilateral lung fields are clear. Bilateral costophrenic angles are clear. Normal cardio-mediastinal silhouette. No acute osseous abnormalities. The soft tissues are within normal limits. IMPRESSION: No active cardiopulmonary disease. Electronically Signed   By: Jules Schick M.D.   On: 04/28/2023 15:28    Cardiac Studies    Echo completed this morning, pending  read   2017 TTE LVEF 55-60%  Patient Profile     78 y.o. female HTN, HLD, DM, asthma (perhaps), no known CAD,  LBBB, admitted with palpitations, dizziness  Assessment & Plan    SB Sinus arrhythmia LBBB (suspect is baseline with nsIVCD back in 2019  She has had recurrent transient sinus pauses, individually 2-2.5 seconds, though back to back 2-3 in a row causing rates to dip into the 30's These have been symptomatic, she is acutely aware of them  She this morning in hindsight reports ongoing for several months, maybe year or more, never really knew what to attribute them to Definitely worse, more frequent in the last few weeks/months No reversible cause, no nodal blockers Labs unremarkable Recommend pacemaker for symptomatic bradycardia, SN dysfunction Dr. Elberta Fortis discussed with her, procedure, she would like to proceed Will plan for later today/Dr. Mealor as schedule allows  04/06/23  > FLU >> progressed to PNA > abx near completion by record She is feeling better from this WBC is WNL afebrile   ADDEND: While Dr. Nelly Laurence was bedside, patient reported active symptoms though no associated change in HRs, no pausing or brady > making PPM implant indication less then clear. Long discussion with patient/family bedside Plan is to proceed with live monitoring placed here and close EP out patient follow up. I have ordered the monitor Called EKG department to place  Made attending team aware  The patient had an out patient monitor ordered (seems via PMD), we have advised the patient when she gets it delivered at the hose to return it.   Management otherwise with IM/attending team  For questions or updates, please contact [facility] Please consult [email or web address] for contact info under        Signed, Sheilah Pigeon, PA-C  04/29/2023, 10:21 AM

## 2023-04-29 NOTE — Progress Notes (Signed)
ZIO AT applied in hospital. Dr. Nelly Laurence to read.

## 2023-04-29 NOTE — TOC Transition Note (Signed)
Transition of Care [facility]) - Discharge Note   Patient Details  Name: Brooke Castillo MRN: [id number] Date of Birth: Sep 20, 1945  Transition of Care [facility]) CM/SW Contact:  Harriet Masson, RN Phone Number: 04/29/2023, 2:49 PM   Clinical Narrative:    Patient stable to discharge home.  No TOC needs at this time.    Final next level of care: Home/Self Care Barriers to Discharge: Barriers Resolved   Patient Goals and CMS Choice Patient states their goals for this hospitalization and ongoing recovery are:: return home          Discharge Placement             home          Discharge Plan and Services Additional resources added to the After Visit Summary for                                       Social Drivers of Health (SDOH) Interventions SDOH Screenings   Food Insecurity: No Food Insecurity (04/28/2023)  Housing: Low Risk  (04/28/2023)  Transportation Needs: No Transportation Needs (04/28/2023)  Utilities: Not At Risk (04/28/2023)  Alcohol Screen: Low Risk  (07/25/2022)  Depression (PHQ2-9): Low Risk  (04/28/2023)  Financial Resource Strain: Low Risk  (07/25/2022)  Physical Activity: Insufficiently Active (07/25/2022)  Social Connections: Moderately Integrated (04/28/2023)  Stress: No Stress Concern Present (07/25/2022)  Tobacco Use: Low Risk  (04/28/2023)     Readmission Risk Interventions    04/29/2023    2:48 PM  Readmission Risk Prevention Plan  Post Dischage Appt Complete  Medication Screening Complete  Transportation Screening Complete

## 2023-04-29 NOTE — Assessment & Plan Note (Addendum)
Pt hypertensive 160s overnight and this morning - Holding Home amlodipine-olmesartan 5- 40 mg daily - Start Amolodipine 5 mg daily to optimize blood pressure   Chronic and Stable Problems:  HLD: Continue Home Pravastain 20 mg and fenofibrate 160mg  daily GERD: Not taking meds currently  Type 2 DM: Hold home Semaglutide 0.5 mg every Wednesday  Diet: Current NPO for possible procedure, restart carb modified diet afterwards  VTE Prophylaxis: subcutaneous heparin (held) for possible procedure, restart DVT ppx afterwards

## 2023-04-29 NOTE — Progress Notes (Signed)
     Daily Progress Note Intern Pager: [phone]  Patient name: Brooke Castillo Medical record number: [id number] Date of birth: [birth year] Age: 78 y.o. Gender: female  Primary Care Provider: Glendale Chard, DO Consultants: EP Code Status: Full Code   Pt Overview and Major Events to Date:  2/11 Admitted  2/12 Planned pacemaker placement   Assessment and Plan:  BELISA EICHHOLZ is a 78 y.o. female who presented with fatigue and lightheadedness who was admitted for symptomatic bradycardia. Pertinent PMH/PSH includes HTN, HLD, GERD and Type 2 DM.  Assessment & Plan Symptomatic bradycardia Pt reports symptoms of fatigue, lightheadedness and palpitations ongoing the past several months. Denies chest pain.  [facility] at family center concerning for bradycardia to 30s with 1st degree AV block. Per tele patient intermittently bradycardic to the 50s overnight and normal sinus this morning. Scheduled for pacemaker placement today.  - Continuous cardiac monitoring - EP following, scheduled for pacemaker placement today  - BMP and CBC WNL  (HFpEF) heart failure with preserved ejection fraction (HCC) Pt echo consistent with EF 50-55%, global hypokinesis, Grade I diastolic dysfunction  - Hold home meds: Lasix 20 mg as needed  HTN (hypertension) Pt hypertensive 160s overnight and this morning - Holding Home amlodipine-olmesartan 5- 40 mg daily - Start Amolodipine 5 mg daily to optimize blood pressure   Chronic and Stable Problems:  HLD: Continue Home Pravastain 20 mg and fenofibrate 160mg  daily GERD: Not taking meds currently  Type 2 DM: Hold home Semaglutide 0.5 mg every Wednesday  Diet: Current NPO for possible procedure, restart carb modified diet afterwards  VTE Prophylaxis: subcutaneous heparin (held) for possible procedure, restart DVT ppx afterwards   Dispo:Home pending medically stability   Subjective:  Patient denies chest pain. A brief episode of dizziness and some palpitations overnight,  which quickly resolved. Reports symptoms have been ongoing for years.   Objective: Temp:  [97.7 F (36.5 C)-98.8 F (37.1 C)] 98.8 F (37.1 C) (02/12 0342) Pulse Rate:  [56-68] 66 (02/12 0342) Resp:  [13-20] 20 (02/12 0342) BP: (100-182)/(62-120) 161/82 (02/12 0342) SpO2:  [95 %-99 %] 95 % (02/12 0342) Weight:  [84.8 kg-85.5 kg] 84.8 kg (02/11 2310) Physical Exam: General: NAD, cooperative  Cardiovascular: No appreciable JVD, RRR, No murmur noted  Respiratory: CTAB in posterior lung fields, NWOB  Gastrointestinal: Non-tender to palpation, normoactive bowel sounds,  Neuro: Alert and Oriented, no focal deficits  Psych: Pleasant  Laboratory: Most recent CBC Lab Results  Component Value Date   WBC 8.1 04/29/2023   HGB 13.1 04/29/2023   HCT 37.5 04/29/2023   MCV 88.0 04/29/2023   PLT 222 04/29/2023   Most recent BMP    Latest Ref Rng & Units 04/29/2023    2:34 AM  BMP  Glucose 70 - 99 mg/dL 962   BUN 8 - 23 mg/dL 15   Creatinine 9.52 - 1.00 mg/dL 8.41   Sodium 324 - 401 mmol/L 137   Potassium 3.5 - 5.1 mmol/L 3.5   Chloride 98 - 111 mmol/L 104   CO2 22 - 32 mmol/L 24   Calcium 8.9 - 10.3 mg/dL 9.2    Other pertinent labs:   Trops 6 and 8  TSH wnl 3.381 A1c 5.8  Imaging/Diagnostic Tests:  CXR 2/11  IMPRESSION: No active cardiopulmonary disease.  Peterson Ao, MD 04/29/2023, 7:05 AM  PGY-1, [facility] Family Medicine FPTS Intern pager: [phone], text pages welcome Secure chat group [facility] Teaching Service

## 2023-04-29 NOTE — Progress Notes (Signed)
Echocardiogram 2D Echocardiogram has been performed.  Brooke Castillo Brooke Castillo 04/29/2023, 8:52 AM

## 2023-04-29 NOTE — Assessment & Plan Note (Addendum)
Pt reports symptoms of fatigue, lightheadedness and palpitations ongoing the past several months. Denies chest pain.  E[facility] at family [facility] concerning for bradycardia to 30s with 1st degree AV block. Per tele patient intermittently bradycardic to the 50s overnight and normal sinus this morning. Scheduled for pacemaker placement today.  - Continuous cardiac monitoring - EP following, scheduled for pacemaker placement today  - BMP and CBC WNL

## 2023-04-29 NOTE — Assessment & Plan Note (Signed)
Pt echo consistent with EF 50-55%, global hypokinesis, Grade I diastolic dysfunction  - Hold home meds: Lasix 20 mg as needed

## 2023-04-29 NOTE — Plan of Care (Signed)
Problem: Education: Goal: Knowledge of General Education information will improve Description: Including pain rating scale, medication(s)/side effects and non-pharmacologic comfort measures 04/29/2023 1526 by Jeri Cos, Alphonzo Dublin, RN Outcome: Adequate for Discharge 04/29/2023 1525 by Jeri Cos, Alphonzo Dublin, RN Outcome: Progressing   Problem: Health Behavior/Discharge Planning: Goal: Ability to manage health-related needs will improve 04/29/2023 1526 by Jeri Cos, Alphonzo Dublin, RN Outcome: Adequate for Discharge 04/29/2023 1525 by Jeri Cos, Alphonzo Dublin, RN Outcome: Progressing   Problem: Clinical Measurements: Goal: Ability to maintain clinical measurements within normal limits will improve 04/29/2023 1526 by Jeri Cos, Alphonzo Dublin, RN Outcome: Adequate for Discharge 04/29/2023 1525 by Jeri Cos, Alphonzo Dublin, RN Outcome: Progressing Goal: Will remain free from infection 04/29/2023 1526 by Jeri Cos, Alphonzo Dublin, RN Outcome: Adequate for Discharge 04/29/2023 1525 by Jeri Cos, Alphonzo Dublin, RN Outcome: Progressing Goal: Diagnostic test results will improve 04/29/2023 1526 by Jeri Cos, Alphonzo Dublin, RN Outcome: Adequate for Discharge 04/29/2023 1525 by Jeri Cos, Alphonzo Dublin, RN Outcome: Progressing Goal: Respiratory complications will improve 04/29/2023 1526 by Jeri Cos, Alphonzo Dublin, RN Outcome: Adequate for Discharge 04/29/2023 1525 by Jeri Cos, Alphonzo Dublin, RN Outcome: Progressing Goal: Cardiovascular complication will be avoided 04/29/2023 1526 by Jeri Cos, Alphonzo Dublin, RN Outcome: Adequate for Discharge 04/29/2023 1525 by Jeri Cos, Alphonzo Dublin, RN Outcome: Progressing   Problem: Activity: Goal: Risk for activity intolerance will decrease 04/29/2023 1526 by Jeri Cos, Alphonzo Dublin, RN Outcome: Adequate for Discharge 04/29/2023 1525 by Jeri Cos, Alphonzo Dublin, RN Outcome: Progressing   Problem: Nutrition: Goal: Adequate nutrition will be maintained 04/29/2023 1526 by Jeri Cos, Alphonzo Dublin,  RN Outcome: Adequate for Discharge 04/29/2023 1525 by Jeri Cos, Alphonzo Dublin, RN Outcome: Progressing   Problem: Coping: Goal: Level of anxiety will decrease 04/29/2023 1526 by Jeri Cos, Alphonzo Dublin, RN Outcome: Adequate for Discharge 04/29/2023 1525 by Jeri Cos, Alphonzo Dublin, RN Outcome: Progressing   Problem: Elimination: Goal: Will not experience complications related to bowel motility 04/29/2023 1526 by Jeri Cos, Alphonzo Dublin, RN Outcome: Adequate for Discharge 04/29/2023 1525 by Jeri Cos, Alphonzo Dublin, RN Outcome: Progressing Goal: Will not experience complications related to urinary retention 04/29/2023 1526 by Jeri Cos, Alphonzo Dublin, RN Outcome: Adequate for Discharge 04/29/2023 1525 by Jeri Cos, Alphonzo Dublin, RN Outcome: Progressing   Problem: Pain Managment: Goal: General experience of comfort will improve and/or be controlled 04/29/2023 1526 by Jeri Cos, Alphonzo Dublin, RN Outcome: Adequate for Discharge 04/29/2023 1525 by Jeri Cos, Alphonzo Dublin, RN Outcome: Progressing   Problem: Safety: Goal: Ability to remain free from injury will improve 04/29/2023 1526 by Jeri Cos, Alphonzo Dublin, RN Outcome: Adequate for Discharge 04/29/2023 1525 by Jeri Cos, Alphonzo Dublin, RN Outcome: Progressing   Problem: Skin Integrity: Goal: Risk for impaired skin integrity will decrease 04/29/2023 1526 by Jeri Cos, Alphonzo Dublin, RN Outcome: Adequate for Discharge 04/29/2023 1525 by Jeri Cos, Alphonzo Dublin, RN Outcome: Progressing   Problem: Education: Goal: Knowledge of cardiac device and self-care will improve 04/29/2023 1526 by Jeri Cos, Alphonzo Dublin, RN Outcome: Adequate for Discharge 04/29/2023 1525 by Jeri Cos, Alphonzo Dublin, RN Outcome: Progressing Goal: Ability to safely manage health related needs after discharge will improve 04/29/2023 1526 by Jeri Cos, Alphonzo Dublin, RN Outcome: Adequate for Discharge 04/29/2023 1525 by Jeri Cos, Alphonzo Dublin, RN Outcome: Progressing Goal: Individualized Educational  Video(s) 04/29/2023 1526 by Jeri Cos, Alphonzo Dublin, RN Outcome: Adequate for Discharge 04/29/2023 1525 by Jeri Cos, Alphonzo Dublin, RN Outcome: Progressing   Problem: Cardiac: Goal:  Ability to achieve and maintain adequate cardiopulmonary perfusion will improve 04/29/2023 1526 by Jeri Cos, Alphonzo Dublin, RN Outcome: Adequate for Discharge 04/29/2023 1525 by Jeri Cos, Alphonzo Dublin, RN Outcome: Progressing

## 2023-04-29 NOTE — TOC CM/SW Note (Signed)
Was notified by UR, Donnajean Lopes, that patient was a code 71 but bedside RN already left patient discharge.  TOC manager,Brenda Chandler, notified of the above.

## 2023-04-29 NOTE — TOC CM/SW Note (Signed)
Transition of Care [facility]) - Inpatient Brief Assessment   Patient Details  Name: Brooke Castillo MRN: [id number] Date of Birth: [birth year]  Transition of Care [facility]) CM/SW Contact:    Harriet Masson, RN Phone Number: 04/29/2023, 11:33 AM   Clinical Narrative: Plan pacemaker placement today.  Transition of Care Department [facility]) has reviewed patient and no TOC needs have been identified at this time. We will continue to monitor patient advancement through interdisciplinary progression rounds. If new patient transition needs arise, please place a TOC consult.   Transition of Care Asessment: Insurance and Status: Insurance coverage has been reviewed Patient has primary care physician: Yes Home environment has been reviewed: safe to discharge home Prior level of function:: independent Prior/Current Home Services: No current home services Social Drivers of Health Review: SDOH reviewed no interventions necessary Readmission risk has been reviewed: Yes Transition of care needs: no transition of care needs at this time

## 2023-04-29 NOTE — Discharge Summary (Signed)
Family Medicine Teaching [facility] Discharge Summary  Patient name: Brooke Castillo Medical record number: [id number] Date of birth: July 12, 1945 Age: 78 y.o. Gender: female Date of Admission: 04/28/2023  Date of Discharge: 04/29/23 Admitting Physician: Nestor Ramp, MD  Primary Care Provider: Glendale Chard, DO Consultants: Cardiology/EP  Indication for Hospitalization: Symptomatic bradycardia  Discharge Diagnoses/Problem List:  Principal Problem for Admission: Symptomatic bradycardia Other Problems addressed during stay:  Principal Problem:   Symptomatic bradycardia Active Problems:   HTN (hypertension)   (HFpEF) heart failure with preserved ejection fraction [facility])    Brief Hospital Course:  Brooke Castillo is a 78 y.o.female with a history of hypertension, hyperlipidemia, T2DM, GERD, diverticulosis who was admitted to the family medicine teaching Service at [facility] for symptomatic bradycardia. Her hospital course is detailed below:  Symptomatic Bradycardia Patient presented from PCP clinic due to palpitations with heart rate of 39 with first-degree AV block on EKG.  Chest x-ray unremarkable.  Patient was evaluated by cardiac electrophysiology who recommended echocardiogram and initially felt she will require require pacemaker placement.  Her echocardiogram showed EF of 50 to 55% with grade 1 diastolic dysfunction.  Upon reevaluation it was decided not to pursue pacemaker placement, and instead place a long-term monitor for the patient to be discharged with.  Patient was discharged with monitor, and outpatient cardiology follow-up was scheduled 3/13.  She was hemodynamically stable upon discharge and was not having recurrent symptomatic bradycardia.   Other chronic conditions were medically managed with home medications and formulary alternatives as necessary  PCP Follow-up Recommendations: Ensure cardiology follow-up and Zio monitor placement Due to normal chest x-ray and  afebrile her outpatient antibiotic (doxycycline) for suspected pneumonia was discontinued.  Follow-up symptoms  Disposition: Home  Discharge Condition: Stable  Discharge Exam:  Vitals:   04/29/23 0908 04/29/23 1100  BP: (!) 171/87 (!) 169/69  Pulse: (!) 58 63  Resp: 16 20  Temp:  97.8 F (36.6 C)  SpO2: 96% 98%   Per Dr. Birdena Crandall who evaluated the patient on the day of discharge: Physical Exam: General: NAD, cooperative  Cardiovascular: No appreciable JVD, RRR, No murmur noted  Respiratory: CTAB in posterior lung fields, NWOB  Gastrointestinal: Non-tender to palpation, normoactive bowel sounds,  Neuro: Alert and Oriented, no focal deficits  Psych: Pleasant   Significant Procedures: zio placed  Significant Labs and Imaging:  Recent Labs  Lab 04/28/23 1222 04/29/23 0234  WBC 7.5 8.1  HGB 15.4* 13.1  HCT 44.8 37.5  PLT 251 222   Recent Labs  Lab 04/28/23 1222 04/29/23 0234  NA 137 137  K 4.1 3.5  CL 103 104  CO2 22 24  GLUCOSE 128* 118*  BUN 15 15  CREATININE 0.73 0.68  CALCIUM 9.4 9.2    CXR: no active disease  Echo:   IMPRESSIONS     1. Left ventricular ejection fraction, by estimation, is 50 to 55%. Left  ventricular ejection fraction by 2D MOD biplane is 54.9 %. The left  ventricle has low normal function. The left ventricle demonstrates global  hypokinesis. Left ventricular  diastolic parameters are consistent with Grade I diastolic dysfunction  (impaired relaxation). The average left ventricular global longitudinal  strain is -16.5 %. The global longitudinal strain is abnormal.   2. Right ventricular systolic function is low normal. The right  ventricular size is mildly enlarged. There is normal pulmonary artery  systolic pressure. The estimated right ventricular systolic pressure is  27.0 mmHg.   3. The mitral  valve is grossly normal. Trivial mitral valve  regurgitation.   4. The aortic valve is tricuspid. Aortic valve regurgitation is not   visualized.   5. The inferior vena cava is normal in size with greater than 50%  respiratory variability, suggesting right atrial pressure of 3 mmHg.   Results/Tests Pending at Time of Discharge: na  Discharge Medications:  Allergies as of 04/29/2023       Reactions   Bee Venom Anaphylaxis, Shortness Of Breath   Within minutes pt feels swelling and SOB, has been to ER for reaction   Celecoxib Other (See Comments)   SOB, weight gain    Ciprofloxacin Hives   Denies airway involvement   Codeine Other (See Comments)   "knocks me out", CNS change   Diflucan [fluconazole] Hives   Denies airway involvement   Ezetimibe Other (See Comments)   Caused leg swelling and flushing    Flagyl [metronidazole] Palpitations   Niaspan [niacin Er (antihyperlipidemic)] Other (See Comments)   Feels "flush" badly   Statins Other (See Comments)   Achey, muscle pain        Medication List     STOP taking these medications    doxycycline 100 MG capsule Commonly known as: VIBRAMYCIN       TAKE these medications    albuterol 108 (90 Base) MCG/ACT inhaler Commonly known as: VENTOLIN HFA Inhale 1-2 puffs into the lungs every 4 (four) hours as needed for wheezing or shortness of breath.   amLODipine-olmesartan 5-40 MG tablet Commonly known as: AZOR TAKE 1 TABLET BY MOUTH DAILY   cetirizine 10 MG tablet Commonly known as: ZYRTEC Take 1 tablet by mouth daily.   cholecalciferol 1000 units tablet Commonly known as: VITAMIN D Take 1,000 Units by mouth daily.   Contour Test test strip Generic drug: glucose blood Please use to check blood sugar levels up to three times daily.   diclofenac Sodium 1 % Gel Commonly known as: VOLTAREN Apply 2 g topically 4 (four) times daily.   EPINEPHrine 0.3 mg/0.3 mL Soaj injection Commonly known as: EPI-PEN Use as directed for life-threatening allergic reaction. What changed:  how much to take how to take this when to take this reasons to take  this additional instructions   fenofibrate 160 MG tablet Take 160 mg by mouth every evening.   fluticasone 50 MCG/ACT nasal spray Commonly known as: FLONASE Place 1-2 sprays into the nose daily.   furosemide 40 MG tablet Commonly known as: LASIX TAKE 1 TABLET(40 MG) BY MOUTH DAILY as needed for swelling What changed:  how much to take how to take this when to take this reasons to take this additional instructions   Magnesium Gluconate 27.5 MG Tabs Take by mouth.   meclizine 12.5 MG tablet Commonly known as: ANTIVERT Take 1 tablet (12.5 mg total) by mouth 3 (three) times daily as needed for dizziness.   nystatin powder Commonly known as: MYCOSTATIN/NYSTOP Apply 1 Application topically 2 (two) times daily as needed.   pravastatin 20 MG tablet Commonly known as: PRAVACHOL TAKE 1 TABLET(20 MG) BY MOUTH AT BEDTIME What changed: See the new instructions.   Semaglutide(0.25 or 0.5MG /DOS) 2 MG/3ML Sopn Inject 0.5 mg into the skin once a week.        Discharge Instructions: Please refer to Patient Instructions section of EMR for full details.  Patient was counseled important signs and symptoms that should prompt return to medical care, changes in medications, dietary instructions, activity restrictions, and follow up appointments.  Follow-Up Appointments:  Follow-up Information     Glendale Chard, DO. Schedule an appointment as soon as possible for a visit in 3 day(s).   Specialty: Family Medicine Contact information: [address] [place] [address] [phone]         Mealor, Roberts Gaudy, MD. Go on 05/28/2023.   Specialty: Cardiology Why: 10:45 AM Contact information: [address] [place] [address] [phone]                 Vonna Drafts, MD 04/29/2023, 2:44 PM PGY-2, [facility] Family Medicine

## 2023-04-30 ENCOUNTER — Telehealth: Payer: Self-pay | Admitting: Internal Medicine

## 2023-04-30 ENCOUNTER — Telehealth: Payer: Self-pay

## 2023-04-30 DIAGNOSIS — R42 Dizziness and giddiness: Secondary | ICD-10-CM | POA: Diagnosis not present

## 2023-04-30 DIAGNOSIS — R002 Palpitations: Secondary | ICD-10-CM | POA: Diagnosis not present

## 2023-04-30 NOTE — Telephone Encounter (Signed)
Called pt to let her know it's recommended she not drive until she follows up with Dr. Nelly Laurence. She verbalized understanding, no further questions at this time.

## 2023-04-30 NOTE — Transitions of Care (Post Inpatient/ED Visit) (Signed)
04/30/2023  Name: Brooke Castillo MRN: [id number] DOB: 1945/10/09  Today's TOC FU Call Status: Today's TOC FU Call Status:: Successful TOC FU Call Completed TOC FU Call Complete Date: 04/30/23 Patient's Name and Date of Birth confirmed.  Transition Care Management Follow-up Telephone Call Date of Discharge: 04/29/23 Discharge Facility: [facility]) Type of Discharge: Inpatient Admission Primary Inpatient Discharge Diagnosis:: Symptomatic Bradycardia How have you been since you were released from the hospital?: Better Any questions or concerns?: No  Items Reviewed: Did you receive and understand the discharge instructions provided?: Yes Medications obtained,verified, and reconciled?: Yes (Medications Reviewed) Any new allergies since your discharge?: No Dietary orders reviewed?: No Do you have support at home?: Yes People in Home: child(ren), adult Name of Support/Comfort Primary Source: [name] (Son)  Medications Reviewed Today: Medications Reviewed Today     Reviewed by Jodelle Gross, RN (Case Manager) on 04/30/23 at 1113  Med List Status: <None>   Medication Order Taking? Sig Documenting Provider Last Dose Status Informant  albuterol (PROVENTIL HFA;VENTOLIN HFA) 108 (90 BASE) MCG/ACT inhaler [id number] Yes Inhale 1-2 puffs into the lungs every 4 (four) hours as needed for wheezing or shortness of breath. [provider] Taking Active Self  amLODipine-olmesartan (AZOR) 5-40 MG tablet [id number] Yes TAKE 1 TABLET BY MOUTH DAILY Glendale Chard, DO Taking Active Self  cetirizine (ZYRTEC) 10 MG tablet [id number] Yes Take 1 tablet by mouth daily. [provider] Taking Active Self  cholecalciferol (VITAMIN D) 1000 units tablet [id number] Yes Take 1,000 Units by mouth daily. [provider] Taking Active Self  diclofenac Sodium (VOLTAREN) 1 % GEL [id number] Yes Apply 2 g topically 4 (four) times daily. [provider] Taking Active Self           Med  Note Jory Ee, TYELISHA Elbert Ewings   Tue Apr 28, 2023  2:47 PM)    EPINEPHrine 0.3 mg/0.3 mL IJ SOAJ injection [id number] Yes Use as directed for life-threatening allergic reaction.  Patient taking differently: Inject 0.3 mg into the muscle once as needed (for life-threatening allergic reaction).   Jessica Priest, MD Taking Active Self           Med Note Azucena Fallen   Tue Apr 28, 2023  2:47 PM)    fenofibrate 160 MG tablet [id number] Yes Take 160 mg by mouth every evening. [provider] Taking Active Self           Med Note Jory Ee, TYELISHA L   Tue Apr 28, 2023  2:47 PM)    fluticasone (FLONASE) 50 MCG/ACT nasal spray [id number] Yes Place 1-2 sprays into the nose daily. [provider] Taking Active Self           Med Note Jory Ee, TYELISHA L   Tue Apr 28, 2023  2:47 PM)    furosemide (LASIX) 40 MG tablet [id number] Yes TAKE 1 TABLET(40 MG) BY MOUTH DAILY as needed for swelling  Patient taking differently: Take 40 mg by mouth daily as needed for fluid.   Celine Mans, MD Taking Active Self  glucose blood (CONTOUR TEST) test strip [id number] Yes Please use to check blood sugar levels up to three times daily. Glendale Chard, DO Taking Active Self           Med Note Azucena Fallen   Tue Apr 28, 2023  2:47 PM)    Magnesium Gluconate 27.5 MG TABS [id number] Yes Take by mouth. [provider] Taking Active Self  meclizine (ANTIVERT) 12.5 MG tablet  [id number] Yes Take 1 tablet (12.5 mg total) by mouth 3 (three) times daily as needed for dizziness. Elberta Fortis, MD Taking Active Self           Med Note Azucena Fallen   Tue Apr 28, 2023  2:47 PM)    nystatin (MYCOSTATIN/NYSTOP) powder [id number] Yes Apply 1 Application topically 2 (two) times daily as needed. [provider] Taking Active Self           Med Note Jory Ee, TYELISHA L   Tue Apr 28, 2023  2:47 PM)    pravastatin (PRAVACHOL) 20 MG tablet [id number] Yes TAKE 1 TABLET(20 MG) BY  MOUTH AT BEDTIME  Patient taking differently: Take 20 mg by mouth at bedtime.   Glendale Chard, DO Taking Active Self  Semaglutide,0.25 or 0.5MG /DOS, 2 MG/3ML Namon Cirri [id number] Yes Inject 0.5 mg into the skin once a week. Elberta Fortis, MD Taking Active Self           Med Note (S[place]TERFIELD, Genoveva Ill   Tue Apr 28, 2023  3:45 PM) Take on Wednesdays             Home Care and Equipment/Supplies: Were Home Health Services Ordered?: No Any new equipment or medical supplies ordered?: No  Functional Questionnaire: Do you need assistance with bathing/showering or dressing?: No Do you need assistance with meal preparation?: No Do you need assistance with eating?: No Do you have difficulty maintaining continence: No Do you need assistance with getting out of bed/getting out of a chair/moving?: No Do you have difficulty managing or taking your medications?: No  Follow up appointments reviewed: PCP Follow-up appointment confirmed?: NA Specialist Hospital Follow-up appointment confirmed?: Yes Date of Specialist follow-up appointment?: 05/28/23 Follow-Up Specialty Provider:: Dr. Nelly Laurence (Cardiology) Do you need transportation to your follow-up appointment?: No Do you understand care options if your condition(s) worsen?: Yes-patient verbalized understanding  SDOH Interventions Today    Flowsheet Row Most Recent Value  SDOH Interventions   Food Insecurity Interventions Intervention Not Indicated  Housing Interventions Intervention Not Indicated  Transportation Interventions Intervention Not Indicated  Utilities Interventions Intervention Not Indicated      Jodelle Gross RN, BSN, CCM   [facility] Manager Population Health Direct Dial: [phone]  Fax: [phone]

## 2023-04-30 NOTE — Telephone Encounter (Signed)
Pt calling wanting to know if she is allowed to drive since being discharged from hosp and monitor being placed. Requesting cb

## 2023-05-01 ENCOUNTER — Encounter: Payer: Self-pay | Admitting: Cardiovascular Disease

## 2023-05-05 ENCOUNTER — Telehealth: Payer: Self-pay | Admitting: Cardiovascular Disease

## 2023-05-05 NOTE — Telephone Encounter (Signed)
 Spoke with patient, states she was walking outside around 2:30 pm with a friend and didn't notice any unusual symptoms. Patient states she has been having brief episodes of lightheadedness on and off for several days but didn't think her last episode correlated with this pause.    Still currently waiting on faxed report from [facility]

## 2023-05-05 NOTE — Telephone Encounter (Signed)
 Received a call from IR reports pt had an abnormal EKG.  4.1 sec pause at 2:30 pm EST. Attempted contact with pt had to leave a message.

## 2023-05-05 NOTE — Telephone Encounter (Signed)
 Brooke Castillo with irhythm calling to report an additional alert on monitor. She states this is a different alert. Call transferred to triage.

## 2023-05-05 NOTE — Telephone Encounter (Signed)
   Cardiac Monitor Alert  Date of alert:  05/05/2023   Patient Name: Brooke Castillo  DOB: Jun 20, 1945  MRN: [id number]   [facility] Cardiologist: None  [facility] EP:  None    Monitor Information: Long Term Monitor-Live Telemetry [ZioAT]Long Term Monitor [ZioXT]  Reason:  Bradycardia Ordering provider:  Dr Rennis Golden   Alert Bradycardia - slowest HR: 47 This is the 1st alert for this rhythm.   Today 05/05/23  at 8:01 am  NSR rate 47 for 4.8 sec.  Today 05/05/23 at 8:21 am NSR rate 48 for 4.8 sec.   Next Cardiology Appointment   Date:  05/28/23  Provider:  Dr Nelly Laurence   The patient was contacted today. She is asymptomatic.   Pt says she just got up and she felt a litle flutter but it only lasted a second or so and she did not have any dizziness, presyncope. She is now getting ready for breakfast and says she is feeling well. She will continue to monitor and let us know if anything changes.   Pt started on 04/29/23 and wearing for 14 days.   Will send to Dr Rennis Golden.    Bertram Millard, RN  05/05/2023 10:27 AM

## 2023-05-05 NOTE — Telephone Encounter (Signed)
 Attempted to contact patient, left message to call our office back

## 2023-05-05 NOTE — Telephone Encounter (Signed)
 [name] with I-r[employer] calling with abn report

## 2023-05-05 NOTE — Telephone Encounter (Signed)
   Cardiac Monitor Alert  Date of alert:  05/05/2023   Patient Name: Brooke Castillo  DOB: [birth year]  MRN: [id number]   [facility] Cardiologist: None  [facility] EP:  Dr. Nelly Laurence  Monitor Information: Long Term Monitor-Live Telemetry [ZioAT]Long Term Monitor [ZioXT]  Reason:  Symptomatic bradycardia Ordering provider:  Francis Dowse, PA  :1}  Alert Pause(s) - Longest:  4.1 seconds This is the 2nd alert for this rhythm.   Next Cardiology Appointment   Date:  05/28/23  Provider:  Dr. Nelly Laurence  The patient was contacted today.  She is asymptomatic. Arrhythmia, symptoms and history reviewed with Dr Jacinto Halim.   Plan:  No changes at this time. Patient states she was walking with her friend outside during this time and unaware of any unusual symptoms during that timeframe.   Festus Holts, RN  05/05/2023 5:06 PM

## 2023-05-27 ENCOUNTER — Telehealth: Payer: Self-pay | Admitting: Physician Assistant

## 2023-05-27 ENCOUNTER — Other Ambulatory Visit: Payer: Self-pay | Admitting: Family Medicine

## 2023-05-27 DIAGNOSIS — E119 Type 2 diabetes mellitus without complications: Secondary | ICD-10-CM

## 2023-05-27 NOTE — Telephone Encounter (Signed)
 Calling to report abnormal findings for pt. Call transferred

## 2023-05-27 NOTE — Telephone Encounter (Signed)
 Spoke with Shari Prows from [facility]. Reported we had final report and it had been forwarded to both providers.

## 2023-05-27 NOTE — Addendum Note (Signed)
 Encounter addended by: Andee Lineman A on: 05/27/2023 8:48 AM  Actions taken: Imaging Exam ended

## 2023-05-28 ENCOUNTER — Ambulatory Visit: Payer: Medicare Other | Attending: Cardiovascular Disease | Admitting: Cardiovascular Disease

## 2023-05-28 ENCOUNTER — Encounter: Payer: Self-pay | Admitting: Cardiovascular Disease

## 2023-05-28 VITALS — BP 100/72 | HR 63 | Ht 63.0 in | Wt 189.2 lb

## 2023-05-28 DIAGNOSIS — I5032 Chronic diastolic (congestive) heart failure: Secondary | ICD-10-CM

## 2023-05-28 DIAGNOSIS — R002 Palpitations: Secondary | ICD-10-CM | POA: Diagnosis not present

## 2023-05-28 DIAGNOSIS — R001 Bradycardia, unspecified: Secondary | ICD-10-CM | POA: Diagnosis not present

## 2023-05-28 NOTE — Progress Notes (Signed)
 Electrophysiology Office Note:    Date:  05/28/2023   ID:  Brooke Castillo, DOB 1945-08-02, MRN [id number]  PCP:  Glendale Chard, DO    HeartCare Providers Cardiologist:  None Electrophysiologist:  Maurice Small, MD     Referring MD: Glendale Chard, DO   History of Present Illness:    Brooke Castillo is a 78 y.o. female with a medical history significant for sinus bradycardia, sinus arrhythmia, left bundle branch block, who presents for EP follow-up.     She has a history of left bundle branch block.  She was admitted in February 2025 with paroxysms of lightheadedness.  She had worn a monitor that showed bradycardia with brief pauses, longest about 3 seconds.  When I met her in the hospital, she experienced an episode of dizziness and presyncope while sitting in the bed.  Her telemetry showed normal sinus rhythm with a rate of about 60 bpm.  We deferred pacemaker at that time considering that she had neurocardiogenic cause for her lightheadedness with a strong vasodilatory component.  Saying that she was symptomatic with normal heart rate indicated that a pacemaker would not be of significant benefit.  She was discharged with a monitor      Today, she presents for follow-up.  She is accompanied by her son and daughter-in-law, [name], who worked in our [employer] for some time.  EKGs/Labs/Other Studies Reviewed Today:     Echocardiogram:  TTE February 2025 50 to 55%.   Monitors:  14 day monitor 04/2023  -- my interpretation Sinus rhythm, heart rate 27 to 110 bpm, average 62 bpm Multiple symptom episodes reported.  Multiple episodes of dizziness and lightheaded correlated with normal sinus rhythm.  Episodes correlated with supraventricular ectopy.  Some episodes of dizziness and lightheadedness correlated with sinus bradycardia.  1 event included a pause of 3.9 seconds.   EKG:   EKG Interpretation Date/Time:  Thursday May 28 2023 10:45:18 EDT Ventricular Rate:   63 PR Interval:  234 QRS Duration:  140 QT Interval:  442 QTC Calculation: 452 R Axis:   -25  Text Interpretation: Sinus rhythm with 1st degree A-V block Non-specific intra-ventricular conduction block Minimal voltage criteria for LVH, may be normal variant ( Cornell product ) Nonspecific T wave abnormality When compared with ECG of 28-Apr-2023 12:16, No significant change was found Confirmed by York Pellant [id number]) on 05/28/2023 11:24:18 AM     Physical Exam:    VS:  BP 100/72 (BP Location: Left Arm, Patient Position: Sitting, Cuff Size: Large)   Pulse 63   Ht 5\' 3"  (1.6 m)   Wt 189 lb 3.2 oz (85.8 kg)   SpO2 97%   BMI 33.52 kg/m     Wt Readings from Last 3 Encounters:  05/28/23 189 lb 3.2 oz (85.8 kg)  04/28/23 186 lb 14.4 oz (84.8 kg)  04/28/23 188 lb 8 oz (85.5 kg)     GEN: Well nourished, well developed in no acute distress CARDIAC: RRR, no murmurs, rubs, gallops RESPIRATORY:  Normal work of breathing MUSCULOSKELETAL: no edema    ASSESSMENT & PLAN:     Dizziness, lightheadedness --neurocardiogenic syncope She has not had frank syncope Multiple episodes of dizziness and lightheadedness that have occurred during sinus rhythm with normal rates She has also had episodes of bradycardia, sometimes with rates as low as 30 bpm. I do not think a pacemaker will significantly improve her symptoms We discussed options moving forward I advised her to remain active and exercise  regularly, to push fluids, to consume a reasonable amount of salt, to wear compressions stockings, and to lie down supine at the earliest onset of symptoms.  Hypertension She has a list of home BPs, sometimes with values in the 140s Continue amlodipine olmesartan 5-40 Will be difficult to balance with her vasopressor issues    Signed, Maurice Small, MD  05/28/2023 11:26 AM    [facility]

## 2023-05-28 NOTE — Patient Instructions (Signed)
 Medication Instructions:  Your physician recommends that you continue on your current medications as directed. Please refer to the Current Medication list given to you today. *If you need a refill on your cardiac medications before your next appointment, please call your pharmacy*   Follow-Up: At [facility], you and your health needs are our priority.  As part of our continuing mission to provide you with exceptional heart care, we have created designated Provider Care Teams.  These Care Teams include your primary Cardiologist (physician) and Advanced Practice Providers (APPs -  Physician Assistants and Nurse Practitioners) who all work together to provide you with the care you need, when you need it.  We recommend signing up for the patient portal called "MyChart".  Sign up information is provided on this After Visit Summary.  MyChart is used to connect with patients for Virtual Visits (Telemedicine).  Patients are able to view lab/test results, encounter notes, upcoming appointments, etc.  Non-urgent messages can be sent to your provider as well.   To learn more about what you can do with MyChart, go to [email or web address].    Your next appointment:   3 month(s)  Provider:   York Pellant, MD

## 2023-05-29 DIAGNOSIS — K080 Exfoliation of teeth due to systemic causes: Secondary | ICD-10-CM | POA: Diagnosis not present

## 2023-06-03 DIAGNOSIS — H2513 Age-related nuclear cataract, bilateral: Secondary | ICD-10-CM | POA: Diagnosis not present

## 2023-06-03 DIAGNOSIS — D3132 Benign neoplasm of left choroid: Secondary | ICD-10-CM | POA: Diagnosis not present

## 2023-06-04 ENCOUNTER — Ambulatory Visit: Payer: Medicare Other | Admitting: Family Medicine

## 2023-06-04 ENCOUNTER — Telehealth: Payer: Self-pay | Admitting: Family Medicine

## 2023-06-04 ENCOUNTER — Encounter: Payer: Self-pay | Admitting: Family Medicine

## 2023-06-04 VITALS — BP 122/72 | HR 64 | Temp 98.3°F | Ht 63.0 in | Wt 189.5 lb

## 2023-06-04 DIAGNOSIS — E785 Hyperlipidemia, unspecified: Secondary | ICD-10-CM

## 2023-06-04 DIAGNOSIS — E1169 Type 2 diabetes mellitus with other specified complication: Secondary | ICD-10-CM | POA: Diagnosis not present

## 2023-06-04 DIAGNOSIS — Z7689 Persons encountering health services in other specified circumstances: Secondary | ICD-10-CM | POA: Insufficient documentation

## 2023-06-04 DIAGNOSIS — I10 Essential (primary) hypertension: Secondary | ICD-10-CM | POA: Diagnosis not present

## 2023-06-04 DIAGNOSIS — I499 Cardiac arrhythmia, unspecified: Secondary | ICD-10-CM | POA: Diagnosis not present

## 2023-06-04 NOTE — Patient Instructions (Signed)
 It was great to meet you today and I'm excited to have you join the [facility] practice. I hope you had a positive experience today! If you feel so inclined, please feel free to recommend our practice to friends and family. Kurtis Bushman, FNP-C

## 2023-06-04 NOTE — Assessment & Plan Note (Signed)
 Continue Pravastatin 20mg  daily. Recheck fasting labs at CPE. I recommend consuming a heart healthy diet such as Mediterranean diet or DASH diet with whole grains, fruits, vegetable, fish, lean meats, nuts, and olive oil. Limit sweets and processed foods. I also encourage moderate intensity exercise 150 minutes weekly. This is 3-5 times weekly for 30-50 minutes each session. Goal should be pace of 3 miles/hours, or walking 1.5 miles in 30 minutes.

## 2023-06-04 NOTE — Assessment & Plan Note (Signed)
 Well controlled on Amlodipine-Olmesartan 5-40mg  daily. Followed by Cardiology. Recommend heart healthy diet such as Mediterranean diet with whole grains, fruits, vegetable, fish, lean meats, nuts, and olive oil. Limit salt. Encouraged moderate walking, 3-5 times/week for 30-50 minutes each session. Aim for at least 150 minutes.week. Goal should be pace of 3 miles/hours, or walking 1.5 miles in 30 minutes. Avoid tobacco products. Avoid excess alcohol. Take medications as prescribed and bring medications and blood pressure log with cuff to each office visit. Seek medical care for chest pain, palpitations, shortness of breath with exertion, dizziness/lightheadedness, vision changes, recurrent headaches, or swelling of extremities. Follow up in 3 months for CPE

## 2023-06-04 NOTE — Progress Notes (Signed)
 New Patient Office Visit  Subjective    Patient ID: JRUE YAMBAO, female    DOB: March 01, 1946  Age: 78 y.o. MRN: [id number]  CC:  Chief Complaint  Patient presents with   Establish Care    HPI Brooke Castillo presents to establish care. Oriented to practice routines and expectations. Has been seeing PCP routinely. PMH includes DM2, LBBB recently discovered, HTN, HLD all well controlled. Concerns today include her recent palpitations over last 2 months that cause symptoms such as lightheadedness and mid sternal pressure. She had seen cardiology and had extensive workup including long term monitor, ECHO, EKG, CMP, CBC, TSH. Troponins, and CXR. Cardiology deemed no benefit from pacemaker, she has seen them as recent as 05/28/2023.  Findings of Zio: predominant NSR, first degree AV block, 1 run of 6 beats vtach, 16 runs SVT, and 28 pauses that were associated with symptoms.   Colon CA screening: colonoscopy 5 years ago without abnormalities. q7y DEXA: DEXA scan, most current DEXA scan T-score is minus unsure. Tobacco: non-smoker Vaccines:  UTD    Outpatient Encounter Medications as of 06/04/2023  Medication Sig   albuterol (PROVENTIL HFA;VENTOLIN HFA) 108 (90 BASE) MCG/ACT inhaler Inhale 1-2 puffs into the lungs every 4 (four) hours as needed for wheezing or shortness of breath.   amLODipine-olmesartan (AZOR) 5-40 MG tablet TAKE 1 TABLET BY MOUTH DAILY   cetirizine (ZYRTEC) 10 MG tablet Take 1 tablet by mouth daily.   cholecalciferol (VITAMIN D) 1000 units tablet Take 1,000 Units by mouth daily.   diclofenac Sodium (VOLTAREN) 1 % GEL Apply 2 g topically 4 (four) times daily.   EPINEPHrine 0.3 mg/0.3 mL IJ SOAJ injection Use as directed for life-threatening allergic reaction. (Patient taking differently: Inject 0.3 mg into the muscle once as needed (for life-threatening allergic reaction).)   fenofibrate 160 MG tablet Take 160 mg by mouth every evening.   fluticasone (FLONASE) 50 MCG/ACT  nasal spray Place 1-2 sprays into the nose daily.   furosemide (LASIX) 40 MG tablet TAKE 1 TABLET(40 MG) BY MOUTH DAILY as needed for swelling (Patient taking differently: Take 40 mg by mouth daily as needed for fluid.)   glucose blood (CONTOUR TEST) test strip Please use to check blood sugar levels up to three times daily.   Magnesium Gluconate 27.5 MG TABS Take by mouth.   meclizine (ANTIVERT) 12.5 MG tablet Take 1 tablet (12.5 mg total) by mouth 3 (three) times daily as needed for dizziness.   nystatin (MYCOSTATIN/NYSTOP) powder Apply 1 Application topically 2 (two) times daily as needed.   OZEMPIC, 0.25 OR 0.5 MG/DOSE, 2 MG/3ML SOPN INJECT 0.5 MG UNDER THE SKIN ONCE A WEEK   pravastatin (PRAVACHOL) 20 MG tablet TAKE 1 TABLET(20 MG) BY MOUTH AT BEDTIME (Patient taking differently: Take 20 mg by mouth at bedtime.)   No facility-administered encounter medications on file as of 06/04/2023.    Past Medical History:  Diagnosis Date   Complication of anesthesia    IN 2011 I HAD COLON SURGERY & IT TOOK ME 2 DAYS TO WAKE UP    Diverticulosis    DM2 (diabetes mellitus, type 2) (HCC)    A1c in 12/16 was 6.6   GERD (gastroesophageal reflux disease)    Hx of colonic polyps    Hymenoptera allergy    Hyperlipidemia    Hypertension    LBBB (left bundle branch block)    transient   Obesity    Rheumatic fever    as a child  Past Surgical History:  Procedure Laterality Date   ABDOMINAL HYSTERECTOMY  1980   complete   APPENDECTOMY  1980   CHOLECYSTECTOMY  1989   COLON SURGERY  2011   removed 11 inches of colon   COLONOSCOPY  08/07/2008   Moderate sigmoid divertiuclosis. Small internal hemorrhoids. Otherwise normal colonoscopy   COLONOSCOPY     around 2017 with Dr Noe Gens at [facility]   KNEE SURGERY Bilateral 2009   SINOSCOPY     TONSILLECTOMY      Family History  Problem Relation Age of Onset   Heart failure Mother    COPD Mother    Hypertension Mother    Coronary  artery disease Father 18       CABGx5; brain stem stroke   Colon polyps Father    Hypertension Sister    Diabetes Sister    Breast cancer Sister    CAD Brother 79       stent   Bone cancer Brother    Prostate cancer Brother    Aneurysm Paternal Grandmother    Heart disease Paternal Grandfather    Colon cancer Neg Hx    Esophageal cancer Neg Hx    Stomach cancer Neg Hx    Rectal cancer Neg Hx     Social History   Socioeconomic History   Marital status: Widowed    Spouse name: Not on file   Number of children: 2   Years of education: Not on file   Highest education level: Associate degree: occupational, Scientist, product/process development, or vocational program  Occupational History   Occupation: Retired   Tobacco Use   Smoking status: Never    Passive exposure: Past   Smokeless tobacco: Never  Vaping Use   Vaping status: Never Used  Substance and Sexual Activity   Alcohol use: No   Drug use: No   Sexual activity: Not on file  Other Topics Concern   Not on file  Social History Narrative   Lives with son, [name], and 2 dogs   Reports son, [name], will be the one to make medical decisions for her if she was unable to do so   Social Drivers of [employer]: Low Risk  (05/29/2023)   Overall Financial Resource Strain (CARDIA)    Difficulty of Paying Living Expenses: Not hard at all  Food Insecurity: No Food Insecurity (05/29/2023)   Hunger Vital Sign    Worried About Running Out of Food in the Last Year: Never true    Ran Out of Food in the Last Year: Never true  Transportation Needs: No Transportation Needs (05/29/2023)   PRAPARE - Administrator, Civil Service (Medical): No    Lack of Transportation (Non-Medical): No  Physical Activity: Sufficiently Active (05/29/2023)   Exercise Vital Sign    Days of Exercise per Week: 5 days    Minutes of Exercise per Session: 50 min  Stress: No Stress Concern Present (05/29/2023)   [employer] of Occupational Health -  Occupational Stress Questionnaire    Feeling of Stress : Only a little  Social Connections: Moderately Integrated (05/29/2023)   Social Connection and Isolation Panel [NHANES]    Frequency of Communication with Friends and Family: More than three times a week    Frequency of Social Gatherings with Friends and Family: More than three times a week    Attends Religious Services: More than 4 times per year    Active Member of [employer] or Organizations: Yes  Attends Banker Meetings: More than 4 times per year    Marital Status: Widowed  Intimate Partner Violence: Not At Risk (04/30/2023)   Humiliation, Afraid, Rape, and Kick questionnaire    Fear of Current or Ex-Partner: No    Emotionally Abused: No    Physically Abused: No    Sexually Abused: No    Review of Systems  Cardiovascular:  Positive for palpitations.  Neurological:        Lightheadedness  All other systems reviewed and are negative.       Objective    BP 122/72   Pulse 64   Temp 98.3 F (36.8 C)   Ht 5\' 3"  (1.6 m)   Wt 189 lb 8 oz (86 kg)   SpO2 97%   BMI 33.57 kg/m   Physical Exam Vitals and nursing note reviewed.  Constitutional:      Appearance: Normal appearance. She is normal weight.  HENT:     Head: Normocephalic and atraumatic.  Cardiovascular:     Rate and Rhythm: Normal rate. Rhythm irregular.     Pulses: Normal pulses.     Heart sounds: Normal heart sounds.  Pulmonary:     Effort: Pulmonary effort is normal.     Breath sounds: Normal breath sounds.  Skin:    General: Skin is warm and dry.  Neurological:     General: No focal deficit present.     Mental Status: She is alert and oriented to person, place, and time. Mental status is at baseline.  Psychiatric:        Mood and Affect: Mood normal.        Behavior: Behavior normal.        Thought Content: Thought content normal.        Judgment: Judgment normal.     Diabetic foot exam was performed.  No deformities or other  abnormal visual findings.  Posterior tibialis and dorsalis pulse intact bilaterally.  Intact to touch and monofilament testing bilaterally.        Assessment & Plan:   Problem List Items Addressed This Visit     HTN (hypertension)   Well controlled on Amlodipine-Olmesartan 5-40mg  daily. Followed by Cardiology. Recommend heart healthy diet such as Mediterranean diet with whole grains, fruits, vegetable, fish, lean meats, nuts, and olive oil. Limit salt. Encouraged moderate walking, 3-5 times/week for 30-50 minutes each session. Aim for at least 150 minutes.week. Goal should be pace of 3 miles/hours, or walking 1.5 miles in 30 minutes. Avoid tobacco products. Avoid excess alcohol. Take medications as prescribed and bring medications and blood pressure log with cuff to each office visit. Seek medical care for chest pain, palpitations, shortness of breath with exertion, dizziness/lightheadedness, vision changes, recurrent headaches, or swelling of extremities. Follow up in 3 months for CPE      DM2 (diabetes mellitus, type 2) (HCC)   A1c 5%. Well controlled on Ozempic 0.5mg  weekly.  A1c and uACR UTD. Foot exam today. Vaccines UTD. Retinal eye exam UTD. Recommend heart healthy diet such as Mediterranean diet with whole grains, fruits, vegetable, fish, lean meats, nuts, and olive oil. Limit salt. Encouraged moderate walking, 3-5 times/week for 30-50 minutes each session. Aim for at least 150 minutes.week. Goal should be pace of 3 miles/hours, or walking 1.5 miles in 30 minutes. Seek medical care for urinary frequency, extreme thirst, vision changes, lightheadedness, dizziness.  Follow up in 3 months for CPE       Irregular heart rhythm   First  degree AV block with LBBB and PACs on EKG. Had recent labs, EKG, zio, ECHO with cardiology. Encouraged to proceed to ED for chest pain. CPE in 3 months with labs week prior. Follow up sooner if symptoms worsen.      Hyperlipidemia   Continue Pravastatin  20mg  daily. Recheck fasting labs at CPE. I recommend consuming a heart healthy diet such as Mediterranean diet or DASH diet with whole grains, fruits, vegetable, fish, lean meats, nuts, and olive oil. Limit sweets and processed foods. I also encourage moderate intensity exercise 150 minutes weekly. This is 3-5 times weekly for 30-50 minutes each session. Goal should be pace of 3 miles/hours, or walking 1.5 miles in 30 minutes.      Encounter to establish care with new doctor - Primary   Today we reviewed your medical history, health maintenance items, and current concerns. Please return to my office in 3 months for CPE and AWV with fasting labs the week before.       Return in about 2 months (around 08/04/2023) for AWV, annual physical with labs 1 week prior.   Park Meo, FNP

## 2023-06-04 NOTE — Assessment & Plan Note (Addendum)
 First degree AV block with LBBB and PACs on EKG. Had recent labs, EKG, zio, ECHO with cardiology. Encouraged to proceed to ED for chest pain. CPE in 3 months with labs week prior. Follow up sooner if symptoms worsen.

## 2023-06-04 NOTE — Telephone Encounter (Signed)
 Patient called and asked which pharmacy, she says [facility] on [address] Advised [name] is not on her current medication list. She says she's been taking it for years and it was there then disappeared. Advised on 04/28/23 it was removed. She says that's the day she went to the hospital, so they removed it, but she still takes it and would need a refill.  Copied from CRM [id number]. Topic: Clinical - Medication Refill >> Jun 04, 2023  1:32 PM Tiffany S wrote: Most Recent Primary Care Visit:  Provider: Kurtis Bushman S  Department: [facility]  Visit Type: TRANSFER OF CARE  Date: 06/04/2023  Medication: Estradol 1mg    Has the patient contacted their pharmacy? No (Agent: If no, request that the patient contact the pharmacy for the refill. If patient does not wish to contact the pharmacy document the reason why and proceed with request.) (Agent: If yes, when and what did the pharmacy advise?)  Is this the correct pharmacy for this prescription? Yes If no, delete pharmacy and type the correct one.  This is the patient's preferred pharmacy:  [id number] [place], [place] - [address] AT [facility] [address] [facility] [address]  [place] [address] Phone: [phone] Fax: [phone]  [facility] - [place], [place] - [address] [place] [address] Phone: [phone] Fax: [phone]   Has the prescription been filled recently? Yes  Is the patient out of the medication? No  Has the patient been seen for an appointment in the last year OR does the patient have an upcoming appointment? Yes  Can we respond through MyChart? Yes  Agent: Please be advised that Rx refills may take up to 3 business days. We ask that you follow-up with your pharmacy.

## 2023-06-04 NOTE — Assessment & Plan Note (Signed)
 Today we reviewed your medical history, health maintenance items, and current concerns. Please return to my office in 3 months for CPE and AWV with fasting labs the week before.

## 2023-06-04 NOTE — Assessment & Plan Note (Addendum)
 A1c 5%. Well controlled on Ozempic 0.5mg  weekly.  A1c and uACR UTD. Foot exam today. Vaccines UTD. Retinal eye exam UTD. Recommend heart healthy diet such as Mediterranean diet with whole grains, fruits, vegetable, fish, lean meats, nuts, and olive oil. Limit salt. Encouraged moderate walking, 3-5 times/week for 30-50 minutes each session. Aim for at least 150 minutes.week. Goal should be pace of 3 miles/hours, or walking 1.5 miles in 30 minutes. Seek medical care for urinary frequency, extreme thirst, vision changes, lightheadedness, dizziness.  Follow up in 3 months for CPE

## 2023-06-10 ENCOUNTER — Other Ambulatory Visit: Payer: Self-pay | Admitting: Family Medicine

## 2023-06-10 ENCOUNTER — Encounter: Payer: Self-pay | Admitting: Family Medicine

## 2023-06-10 ENCOUNTER — Telehealth: Payer: Self-pay

## 2023-06-10 DIAGNOSIS — R42 Dizziness and giddiness: Secondary | ICD-10-CM

## 2023-06-10 DIAGNOSIS — R002 Palpitations: Secondary | ICD-10-CM | POA: Diagnosis not present

## 2023-06-10 NOTE — Telephone Encounter (Signed)
 Copied from CRM [id number]. Topic: Clinical - Medication Refill >> Jun 04, 2023  1:32 PM Tiffany S wrote: Most Recent Primary Care Visit:  Provider: Kurtis Bushman S  Department: [facility]  Visit Type: TRANSFER OF CARE  Date: 06/04/2023  Medication: Estradol 1mg    Has the patient contacted their pharmacy? No (Agent: If no, request that the patient contact the pharmacy for the refill. If patient does not wish to contact the pharmacy document the reason why and proceed with request.) (Agent: If yes, when and what did the pharmacy advise?)  Is this the correct pharmacy for this prescription? Yes If no, delete pharmacy and type the correct one.  This is the patient's preferred pharmacy:  [id number] [place], [place] - [address] AT [facility] [address] [facility] [address] [place] [address] Phone: [phone] Fax: [phone]  [facility] - [place], [place] - [address] [place] [address] Phone: [phone] Fax: [phone]   Has the prescription been filled recently? Yes  Is the patient out of the medication? No  Has the patient been seen for an appointment in the last year OR does the patient have an upcoming appointment? Yes  Can we respond through MyChart? Yes  Agent: Please be advised that Rx refills may take up to 3 business days. We ask that you follow-up with your pharmacy.

## 2023-06-11 ENCOUNTER — Other Ambulatory Visit: Payer: Self-pay | Admitting: Family Medicine

## 2023-06-11 MED ORDER — FENOFIBRATE 160 MG PO TABS: 160.0000 mg | ORAL_TABLET | Freq: Every evening | ORAL | 1 refills | Status: DC

## 2023-07-06 ENCOUNTER — Other Ambulatory Visit: Payer: Self-pay | Admitting: Student

## 2023-07-28 ENCOUNTER — Ambulatory Visit (INDEPENDENT_AMBULATORY_CARE_PROVIDER_SITE_OTHER): Admitting: Family Medicine

## 2023-07-28 ENCOUNTER — Other Ambulatory Visit: Payer: Self-pay

## 2023-07-28 ENCOUNTER — Other Ambulatory Visit (HOSPITAL_COMMUNITY): Payer: Self-pay

## 2023-07-28 ENCOUNTER — Encounter: Payer: Self-pay | Admitting: Family Medicine

## 2023-07-28 VITALS — BP 130/72 | HR 66 | Ht 63.0 in | Wt 187.4 lb

## 2023-07-28 DIAGNOSIS — I10 Essential (primary) hypertension: Secondary | ICD-10-CM | POA: Diagnosis not present

## 2023-07-28 DIAGNOSIS — S39011A Strain of muscle, fascia and tendon of abdomen, initial encounter: Secondary | ICD-10-CM | POA: Insufficient documentation

## 2023-07-28 MED ORDER — AMLODIPINE-OLMESARTAN 5-40 MG PO TABS
1.0000 | ORAL_TABLET | Freq: Every day | ORAL | 3 refills | Status: DC
  Filled 2023-07-28: qty 90, 90d supply, fill #0

## 2023-07-28 NOTE — Assessment & Plan Note (Signed)
Medication refill provided.

## 2023-07-28 NOTE — Progress Notes (Signed)
 Subjective:  HPI: Brooke Castillo is a 78 y.o. female presenting on 07/28/2023 for No chief complaint on file.   HPI Patient is in today for right groin pain that started when she was walking uphill and felt a pull 3 weeks ago. She is experiencing weakness in her right leg due to the pain that makes it difficult to lift her leg. She denies numbness, tingling, or radiating pain. No redness, warmth, swelling, bulging. Has tried massage, Tylenol .  Review of Systems  All other systems reviewed and are negative.   Relevant past medical history reviewed and updated as indicated.   Past Medical History:  Diagnosis Date   Complication of anesthesia    IN 2011 I HAD COLON SURGERY & IT TOOK ME 2 DAYS TO WAKE UP    Diverticulosis    DM2 (diabetes mellitus, type 2) (HCC)    A1c in 12/16 was 6.6   GERD (gastroesophageal reflux disease)    Hx of colonic polyps    Hymenoptera allergy    Hyperlipidemia    Hypertension    LBBB (left bundle branch block)    transient   Obesity    Rheumatic fever    as a child     Past Surgical History:  Procedure Laterality Date   ABDOMINAL HYSTERECTOMY  1980   complete   APPENDECTOMY  1980   CHOLECYSTECTOMY  1989   COLON SURGERY  2011   removed 11 inches of colon   COLONOSCOPY  08/07/2008   Moderate sigmoid divertiuclosis. Small internal hemorrhoids. Otherwise normal colonoscopy   COLONOSCOPY     around 2017 with Dr Onesimo Bijou at [facility]   KNEE SURGERY Bilateral 2009   SINOSCOPY     TONSILLECTOMY      Allergies and medications reviewed and updated.   Current Outpatient Medications:    albuterol  (PROVENTIL  HFA;VENTOLIN  HFA) 108 (90 BASE) MCG/ACT inhaler, Inhale 1-2 puffs into the lungs every 4 (four) hours as needed for wheezing or shortness of breath., Disp: , Rfl:    cetirizine (ZYRTEC) 10 MG tablet, Take 1 tablet by mouth daily., Disp: , Rfl:    cholecalciferol (VITAMIN D) 1000 units tablet, Take 1,000 Units by mouth daily., Disp:  , Rfl:    CONTOUR NEXT TEST test strip, USE TO CHECK BLOOD SUGAR LEVELS UP TO THREE TIMES A DAY, Disp: 250 strip, Rfl: 3   diclofenac Sodium (VOLTAREN) 1 % GEL, Apply 2 g topically 4 (four) times daily., Disp: , Rfl:    EPINEPHrine  0.3 mg/0.3 mL IJ SOAJ injection, Use as directed for life-threatening allergic reaction. (Patient taking differently: Inject 0.3 mg into the muscle once as needed (for life-threatening allergic reaction).), Disp: 2 Device, Rfl: 3   estradiol (ESTRACE) 0.5 MG tablet, Take 1 tablet (0.5 mg total) by mouth every other day., Disp: 90 tablet, Rfl: 1   fenofibrate  160 MG tablet, Take 1 tablet (160 mg total) by mouth every evening., Disp: 90 tablet, Rfl: 1   fluticasone  (FLONASE) 50 MCG/ACT nasal spray, Place 1-2 sprays into the nose daily., Disp: , Rfl:    furosemide  (LASIX ) 40 MG tablet, TAKE 1 TABLET(40 MG) BY MOUTH DAILY as needed for swelling (Patient taking differently: Take 40 mg by mouth daily as needed for fluid.), Disp: 90 tablet, Rfl: 0   Magnesium Gluconate 27.5 MG TABS, Take by mouth., Disp: , Rfl:    meclizine  (ANTIVERT ) 12.5 MG tablet, Take 1 tablet (12.5 mg total) by mouth 3 (three) times daily as needed for  dizziness., Disp: 30 tablet, Rfl: 0   nystatin (MYCOSTATIN/NYSTOP) powder, Apply 1 Application topically 2 (two) times daily as needed., Disp: , Rfl:    OZEMPIC , 0.25 OR 0.5 MG/DOSE, 2 MG/3ML SOPN, INJECT 0.5 MG UNDER THE SKIN ONCE A WEEK, Disp: 3 mL, Rfl: 3   pravastatin  (PRAVACHOL ) 20 MG tablet, TAKE 1 TABLET(20 MG) BY MOUTH AT BEDTIME (Patient taking differently: Take 20 mg by mouth at bedtime.), Disp: 90 tablet, Rfl: 3   amLODipine -olmesartan  (AZOR ) 5-40 MG tablet, Take 1 tablet by mouth daily., Disp: 90 tablet, Rfl: 3  Allergies  Allergen Reactions   Bee Venom Anaphylaxis and Shortness Of Breath    Within minutes pt feels swelling and SOB, has been to ER for reaction   Celecoxib Other (See Comments)    SOB, weight gain      Ciprofloxacin Hives     Denies airway involvement   Codeine Other (See Comments)    "knocks me out", CNS change    Diflucan [Fluconazole] Hives    Denies airway involvement   Ezetimibe Other (See Comments)    Caused leg swelling and flushing     Flagyl [Metronidazole] Palpitations   Niaspan [Niacin Er (Antihyperlipidemic)] Other (See Comments)    Feels "flush" badly   Statins Other (See Comments)    Achey, muscle pain    Objective:   BP 130/72   Pulse 66   Ht 5\' 3"  (1.6 m)   Wt 187 lb 6 oz (85 kg)   SpO2 97%   BMI 33.19 kg/m      07/28/2023    8:40 AM 06/04/2023   10:48 AM 05/28/2023   10:41 AM  Vitals with BMI  Height 5\' 3"  5\' 3"  5\' 3"   Weight 187 lbs 6 oz 189 lbs 8 oz 189 lbs 3 oz  BMI 33.2 33.58 33.52  Systolic 130 122 409  Diastolic 72 72 72  Pulse 66 64 63     Physical Exam Vitals and nursing note reviewed.  Constitutional:      Appearance: Normal appearance. She is normal weight.  HENT:     Head: Normocephalic and atraumatic.  Musculoskeletal:     Right upper leg: Tenderness present.     Left upper leg: Normal.       Legs:  Skin:    General: Skin is warm and dry.  Neurological:     General: No focal deficit present.     Mental Status: She is alert and oriented to person, place, and time. Mental status is at baseline.  Psychiatric:        Mood and Affect: Mood normal.        Behavior: Behavior normal.        Thought Content: Thought content normal.        Judgment: Judgment normal.     Assessment & Plan:  Strain of muscle of right groin region Assessment & Plan: Tenderness to palpation of right groin area, no hernia on exam, no abdominal pain, no pain at rest, normal sensory exam. Encouraged NSAIDs, rest, and ice. Will consider PT or ortho referral if pain persists.    Primary hypertension Assessment & Plan: Medication refill provided  Orders: -     amLODIPine -Olmesartan ; Take 1 tablet by mouth daily.  Dispense: 90 tablet; Refill: 3     Follow up plan: Return  if symptoms worsen or fail to improve.  Jenelle Mis, FNP

## 2023-07-28 NOTE — Assessment & Plan Note (Signed)
 Tenderness to palpation of right groin area, no hernia on exam, no abdominal pain, no pain at rest, normal sensory exam. Encouraged NSAIDs, rest, and ice. Will consider PT or ortho referral if pain persists.

## 2023-07-29 ENCOUNTER — Other Ambulatory Visit (HOSPITAL_COMMUNITY): Payer: Self-pay

## 2023-07-29 MED ORDER — AMLODIPINE-OLMESARTAN 5-40 MG PO TABS
1.0000 | ORAL_TABLET | Freq: Every day | ORAL | 3 refills | Status: DC
Start: 2023-01-12 — End: 2023-11-04
  Filled 2023-07-29 – 2023-08-12 (×2): qty 90, 90d supply, fill #0

## 2023-07-29 MED ORDER — GLIPIZIDE ER 2.5 MG PO TB24
2.5000 mg | ORAL_TABLET | Freq: Every day | ORAL | 0 refills | Status: DC
  Filled 2023-07-29: qty 30, 30d supply, fill #0

## 2023-07-29 MED ORDER — PRAVASTATIN SODIUM 20 MG PO TABS
20.0000 mg | ORAL_TABLET | Freq: Every day | ORAL | 2 refills | Status: AC
  Filled 2023-07-29 – 2023-11-11 (×4): qty 90, 90d supply, fill #0
  Filled 2024-02-24: qty 90, 90d supply, fill #1

## 2023-07-29 MED ORDER — AMOXICILLIN 500 MG PO CAPS
2000.0000 mg | ORAL_CAPSULE | Freq: Every day | ORAL | 1 refills | Status: DC
  Filled 2023-07-29 – 2023-10-01 (×3): qty 12, 3d supply, fill #0
  Filled 2023-10-23: qty 12, 3d supply, fill #1

## 2023-07-29 MED ORDER — ESTRADIOL 0.5 MG PO TABS
0.5000 mg | ORAL_TABLET | ORAL | 1 refills | Status: DC
  Filled 2023-07-29: qty 45, 90d supply, fill #0

## 2023-07-29 MED ORDER — FENOFIBRATE 160 MG PO TABS
160.0000 mg | ORAL_TABLET | Freq: Every evening | ORAL | 1 refills | Status: DC
  Filled 2023-07-29 – 2023-09-15 (×2): qty 90, 90d supply, fill #0

## 2023-08-05 ENCOUNTER — Ambulatory Visit: Admitting: Family Medicine

## 2023-08-06 ENCOUNTER — Other Ambulatory Visit (HOSPITAL_COMMUNITY): Payer: Self-pay

## 2023-08-12 ENCOUNTER — Other Ambulatory Visit (HOSPITAL_COMMUNITY): Payer: Self-pay

## 2023-08-12 ENCOUNTER — Encounter (HOSPITAL_COMMUNITY): Payer: Self-pay

## 2023-08-12 ENCOUNTER — Ambulatory Visit
Admission: RE | Admit: 2023-08-12 | Discharge: 2023-08-12 | Disposition: A | Discharge status: Home / Self Care | Source: Ambulatory Visit | Attending: Family Medicine

## 2023-08-12 ENCOUNTER — Other Ambulatory Visit: Payer: Self-pay | Admitting: Family Medicine

## 2023-08-12 ENCOUNTER — Ambulatory Visit (INDEPENDENT_AMBULATORY_CARE_PROVIDER_SITE_OTHER): Admitting: Family Medicine

## 2023-08-12 ENCOUNTER — Other Ambulatory Visit: Payer: Self-pay

## 2023-08-12 ENCOUNTER — Encounter: Payer: Self-pay | Admitting: Family Medicine

## 2023-08-12 VITALS — BP 135/82 | HR 59 | Ht 63.0 in | Wt 187.6 lb

## 2023-08-12 DIAGNOSIS — E785 Hyperlipidemia, unspecified: Secondary | ICD-10-CM

## 2023-08-12 DIAGNOSIS — Z0000 Encounter for general adult medical examination without abnormal findings: Secondary | ICD-10-CM | POA: Diagnosis not present

## 2023-08-12 DIAGNOSIS — M47816 Spondylosis without myelopathy or radiculopathy, lumbar region: Secondary | ICD-10-CM | POA: Diagnosis not present

## 2023-08-12 DIAGNOSIS — I10 Essential (primary) hypertension: Secondary | ICD-10-CM | POA: Diagnosis not present

## 2023-08-12 DIAGNOSIS — E1169 Type 2 diabetes mellitus with other specified complication: Secondary | ICD-10-CM | POA: Diagnosis not present

## 2023-08-12 DIAGNOSIS — Z1321 Encounter for screening for nutritional disorder: Secondary | ICD-10-CM

## 2023-08-12 DIAGNOSIS — G8929 Other chronic pain: Secondary | ICD-10-CM | POA: Insufficient documentation

## 2023-08-12 DIAGNOSIS — E1159 Type 2 diabetes mellitus with other circulatory complications: Secondary | ICD-10-CM

## 2023-08-12 DIAGNOSIS — Z7984 Long term (current) use of oral hypoglycemic drugs: Secondary | ICD-10-CM

## 2023-08-12 DIAGNOSIS — E119 Type 2 diabetes mellitus without complications: Secondary | ICD-10-CM | POA: Insufficient documentation

## 2023-08-12 DIAGNOSIS — Z1231 Encounter for screening mammogram for malignant neoplasm of breast: Secondary | ICD-10-CM | POA: Insufficient documentation

## 2023-08-12 DIAGNOSIS — M25551 Pain in right hip: Secondary | ICD-10-CM

## 2023-08-12 DIAGNOSIS — E669 Obesity, unspecified: Secondary | ICD-10-CM | POA: Diagnosis not present

## 2023-08-12 DIAGNOSIS — M160 Bilateral primary osteoarthritis of hip: Secondary | ICD-10-CM | POA: Diagnosis not present

## 2023-08-12 DIAGNOSIS — Z780 Asymptomatic menopausal state: Secondary | ICD-10-CM

## 2023-08-12 DIAGNOSIS — M4316 Spondylolisthesis, lumbar region: Secondary | ICD-10-CM | POA: Diagnosis not present

## 2023-08-12 DIAGNOSIS — Z13820 Encounter for screening for osteoporosis: Secondary | ICD-10-CM

## 2023-08-12 NOTE — Patient Instructions (Signed)
  Brooke Castillo , Thank you for taking time to come for your Medicare Wellness Visit. I appreciate your ongoing commitment to your health goals. Please review the following plan we discussed and let me know if I can assist you in the future.   These are the goals we discussed:  Goals      Remain active and independent        This is a list of the screening recommended for you and due dates:  Health Maintenance  Topic Date Due   COVID-19 Vaccine (5 - 2024-25 season) 11/16/2022   Medicare Annual Wellness Visit  07/25/2023   Eye exam for diabetics  09/15/2023   Yearly kidney health urinalysis for diabetes  09/22/2023   Flu Shot  10/16/2023   Hemoglobin A1C  10/26/2023   Yearly kidney function blood test for diabetes  04/28/2024   Complete foot exam   06/03/2024   Colon Cancer Screening  05/14/2025   DTaP/Tdap/Td vaccine (2 - Td or Tdap) 04/20/2028   Pneumonia Vaccine  Completed   DEXA scan (bone density measurement)  Completed   Hepatitis C Screening  Completed   Zoster (Shingles) Vaccine  Completed   HPV Vaccine  Aged Out   Meningitis B Vaccine  Aged Out

## 2023-08-12 NOTE — Assessment & Plan Note (Signed)
 A1c today. Continue Ozempic  0.5mg  weekly.  A1c and uACR UTD. Foot exam UTD. Vaccines UTD. Retinal eye exam UTD. Recommend heart healthy diet such as Mediterranean diet with whole grains, fruits, vegetable, fish, lean meats, nuts, and olive oil. Limit salt. Encouraged moderate walking, 3-5 times/week for 30-50 minutes each session. Aim for at least 150 minutes.week. Goal should be pace of 3 miles/hours, or walking 1.5 miles in 30 minutes. Seek medical care for urinary frequency, extreme thirst, vision changes, lightheadedness, dizziness.  6 month follow up or sooner if needed

## 2023-08-12 NOTE — Assessment & Plan Note (Signed)
 Continue Pravastatin 20mg  daily. Recheck fasting labs at CPE. I recommend consuming a heart healthy diet such as Mediterranean diet or DASH diet with whole grains, fruits, vegetable, fish, lean meats, nuts, and olive oil. Limit sweets and processed foods. I also encourage moderate intensity exercise 150 minutes weekly. This is 3-5 times weekly for 30-50 minutes each session. Goal should be pace of 3 miles/hours, or walking 1.5 miles in 30 minutes.

## 2023-08-12 NOTE — Progress Notes (Signed)
 Subjective:   Brooke Castillo is a 78 y.o. female who presents for Medicare Annual (Subsequent) preventive examination.  Visit Complete: In person  Patient Medicare AWV questionnaire was completed by the patient on today; I have confirmed that all information answered by patient is correct and no changes since this date.        Objective:     Today's Vitals   08/12/23 0755  Weight: 187 lb 9.6 oz (85.1 kg)  Height: 5\' 3"  (1.6 m)   Body mass index is 33.23 kg/m.     04/28/2023   12:19 PM 04/28/2023    9:54 AM 10/21/2022    9:51 AM 07/25/2022    5:15 PM 04/23/2022    8:51 AM 05/29/2016    3:53 PM 07/21/2013    2:14 PM  Advanced Directives  Does Patient Have a Medical Advance Directive? No No No No Yes No Patient has advance directive, copy in chart  Type of Advance Directive       Living will  Does patient want to make changes to medical advance directive?       No change requested  Would patient like information on creating a medical advance directive? No - Patient declined No - Patient declined  Yes (MAU/Ambulatory/Procedural Areas - Information given)  No - Patient declined     Current Medications (verified) Outpatient Encounter Medications as of 08/12/2023  Medication Sig   albuterol  (PROVENTIL  HFA;VENTOLIN  HFA) 108 (90 BASE) MCG/ACT inhaler Inhale 1-2 puffs into the lungs every 4 (four) hours as needed for wheezing or shortness of breath.   amLODipine -olmesartan  (AZOR ) 5-40 MG tablet Take 1 tablet by mouth daily.   amLODipine -olmesartan  (AZOR ) 5-40 MG tablet Take 1 tablet by mouth daily.   amoxicillin  (AMOXIL ) 500 MG capsule Take 4 capsules (2,000 mg total) by mouth 1 hour before dental appointment.   cetirizine (ZYRTEC) 10 MG tablet Take 1 tablet by mouth daily.   cholecalciferol (VITAMIN D) 1000 units tablet Take 1,000 Units by mouth daily.   CONTOUR NEXT TEST test strip USE TO CHECK BLOOD SUGAR LEVELS UP TO THREE TIMES A DAY   diclofenac Sodium (VOLTAREN) 1 % GEL Apply 2  g topically 4 (four) times daily.   EPINEPHrine  0.3 mg/0.3 mL IJ SOAJ injection Use as directed for life-threatening allergic reaction. (Patient taking differently: Inject 0.3 mg into the muscle once as needed (for life-threatening allergic reaction).)   estradiol  (ESTRACE ) 0.5 MG tablet Take 1 tablet (0.5 mg total) by mouth every other day.   estradiol  (ESTRACE ) 0.5 MG tablet Take 1 tablet (0.5 mg total) by mouth every other day.   fenofibrate  160 MG tablet Take 1 tablet (160 mg total) by mouth every evening.   fenofibrate  160 MG tablet Take 1 tablet (160 mg total) by mouth every evening.   fluticasone  (FLONASE) 50 MCG/ACT nasal spray Place 1-2 sprays into the nose daily.   furosemide  (LASIX ) 40 MG tablet TAKE 1 TABLET(40 MG) BY MOUTH DAILY as needed for swelling (Patient taking differently: Take 40 mg by mouth daily as needed for fluid.)   glipiZIDE  (GLUCOTROL  XL) 2.5 MG 24 hr tablet Take 1 tablet (2.5 mg total) by mouth daily.   Magnesium Gluconate 27.5 MG TABS Take by mouth.   meclizine  (ANTIVERT ) 12.5 MG tablet Take 1 tablet (12.5 mg total) by mouth 3 (three) times daily as needed for dizziness.   nystatin (MYCOSTATIN/NYSTOP) powder Apply 1 Application topically 2 (two) times daily as needed.   OZEMPIC , 0.25 OR  0.5 MG/DOSE, 2 MG/3ML SOPN INJECT 0.5 MG UNDER THE SKIN ONCE A WEEK   pravastatin  (PRAVACHOL ) 20 MG tablet TAKE 1 TABLET(20 MG) BY MOUTH AT BEDTIME (Patient taking differently: Take 20 mg by mouth at bedtime.)   pravastatin  (PRAVACHOL ) 20 MG tablet Take 1 tablet (20 mg total) by mouth at bedtime.   No facility-administered encounter medications on file as of 08/12/2023.    Allergies (verified) Bee venom, Celecoxib, Ciprofloxacin, Codeine, Diflucan [fluconazole], Ezetimibe, Flagyl [metronidazole], Niaspan [niacin er (antihyperlipidemic)], and Statins   History: Past Medical History:  Diagnosis Date   Complication of anesthesia    IN 2011 I HAD COLON SURGERY & IT TOOK ME 2 DAYS TO  WAKE UP    Diverticulosis    DM2 (diabetes mellitus, type 2) (HCC)    A1c in 12/16 was 6.6   GERD (gastroesophageal reflux disease)    Hx of colonic polyps    Hymenoptera allergy    Hyperlipidemia    Hypertension    LBBB (left bundle branch block)    transient   Obesity    Rheumatic fever    as a child   Past Surgical History:  Procedure Laterality Date   ABDOMINAL HYSTERECTOMY  1980   complete   APPENDECTOMY  1980   CHOLECYSTECTOMY  1989   COLON SURGERY  2011   removed 11 inches of colon   COLONOSCOPY  08/07/2008   Moderate sigmoid divertiuclosis. Small internal hemorrhoids. Otherwise normal colonoscopy   COLONOSCOPY     around 2017 with Dr Brooke Castillo at [facility]   KNEE SURGERY Bilateral 2009   SINOSCOPY     TONSILLECTOMY     Family History  Problem Relation Age of Onset   Heart failure Mother    COPD Mother    Hypertension Mother    Coronary artery disease Father 14       CABGx5; brain stem stroke   Colon polyps Father    Hypertension Sister    Diabetes Sister    Breast cancer Sister    CAD Brother 16       stent   Bone cancer Brother    Prostate cancer Brother    Aneurysm Paternal Grandmother    Heart disease Paternal Grandfather    Colon cancer Neg Hx    Esophageal cancer Neg Hx    Stomach cancer Neg Hx    Rectal cancer Neg Hx    Social History   Socioeconomic History   Marital status: Widowed    Spouse name: Not on file   Number of children: 2   Years of education: Not on file   Highest education level: Associate degree: occupational, Scientist, product/process development, or vocational program  Occupational History   Occupation: Retired   Tobacco Use   Smoking status: Never    Passive exposure: Past   Smokeless tobacco: Never  Vaping Use   Vaping status: Never Used  Substance and Sexual Activity   Alcohol use: No   Drug use: No   Sexual activity: Not on file  Other Topics Concern   Not on file  Social History Narrative   Lives with son, Brooke Castillo, and 2 dogs    Reports son, Brooke Castillo, will be the one to make medical decisions for her if she was unable to do so   Social Drivers of [employer]: Low Risk  (05/29/2023)   Overall Financial Resource Strain (CARDIA)    Difficulty of Paying Living Expenses: Not hard at all  Food Insecurity:  No Food Insecurity (05/29/2023)   Hunger Vital Sign    Worried About Running Out of Food in the Last Year: Never true    Ran Out of Food in the Last Year: Never true  Transportation Needs: No Transportation Needs (05/29/2023)   PRAPARE - Administrator, Civil Service (Medical): No    Lack of Transportation (Non-Medical): No  Physical Activity: Sufficiently Active (05/29/2023)   Exercise Vital Sign    Days of Exercise per Week: 5 days    Minutes of Exercise per Session: 50 min  Stress: No Stress Concern Present (05/29/2023)   [employer] of Occupational Health - Occupational Stress Questionnaire    Feeling of Stress : Only a little  Social Connections: Moderately Integrated (05/29/2023)   Social Connection and Isolation Panel [NHANES]    Frequency of Communication with Friends and Family: More than three times a week    Frequency of Social Gatherings with Friends and Family: More than three times a week    Attends Religious Services: More than 4 times per year    Active Member of [employer] or Organizations: Yes    Attends Banker Meetings: More than 4 times per year    Marital Status: Widowed    Tobacco Counseling Counseling given: Not Answered   Clinical Intake:                        Activities of Daily Living    04/28/2023    4:42 PM  In your present state of health, do you have any difficulty performing the following activities:  Hearing? 0  Vision? 0  Difficulty concentrating or making decisions? 0  Doing errands, shopping? 0    Patient Care Team: Jenelle Mis, FNP as PCP - General (Family Medicine) Mealor, Donnamae Gaba, MD as PCP -  Electrophysiology (Cardiology) Keenan Pastor (Optometry) Currence, Cordie Deters, PA-C (Orthopedic Surgery)  Indicate any recent Medical Services you may have received from other than [facility] providers in the past year (date may be approximate).     Assessment:    This is a routine wellness examination for Brooke Castillo.  Hearing/Vision screen No results found.   Goals Addressed   None    Depression Screen    04/28/2023    9:53 AM 04/09/2023    3:06 PM 02/04/2023    9:15 AM 10/21/2022    9:52 AM 09/22/2022    9:48 AM 09/08/2022   10:12 AM 07/25/2022    4:55 PM  PHQ 2/9 Scores  PHQ - 2 Score 0 0 0 0 2  0  PHQ- 9 Score 0 0 0 0 9    Exception Documentation      Patient refusal     Fall Risk    04/28/2023    9:52 AM 04/09/2023    3:05 PM 10/21/2022   10:14 AM 10/21/2022    9:52 AM 10/20/2022    1:32 PM  Fall Risk   Falls in the past year? 0 0 0 0 0  Number falls in past yr: 0 0   0  Injury with Fall? 0 0   0  Risk for fall due to :     No Fall Risks    MEDICARE RISK AT HOME:    TIMED UP AND GO:  Was the test performed?  Yes  Length of time to ambulate 10 feet: 10 sec Gait steady and fast without use of assistive device    Cognitive  Function:        07/25/2022    5:15 PM  6CIT Screen  What Year? 0 points  What month? 0 points  What time? 0 points  Count back from 20 0 points  Months in reverse 0 points  Repeat phrase 0 points  Total Score 0 points    Immunizations Immunization History  Administered Date(s) Administered   Fluad Quad(high Dose 65+) 01/15/2022   Influenza, High Dose Seasonal PF 01/13/2014   Influenza-Unspecified 01/27/2023   Moderna Sars-Covid-2 Vaccination 05/27/2019, 06/24/2019, 01/24/2020, 07/26/2020   Pneumococcal Conjugate-13 01/03/2016   Pneumococcal Polysaccharide-23 09/07/2007, 09/12/2008, 08/24/2017   Rsv, Bivalent, Protein Subunit Rsvpref,pf Pattricia Bores) 01/23/2022   Tdap 04/20/2018   Zoster Recombinant(Shingrix) 10/04/2021, 01/23/2022   Zoster,  Live 05/27/2010    TDAP status: Up to date  Flu Vaccine status: Up to date  Pneumococcal vaccine status: Up to date  Covid-19 vaccine status: Information provided on how to obtain vaccines.   Qualifies for Shingles Vaccine? Yes   Zostavax completed Yes   Shingrix Completed?: Yes  Screening Tests Health Maintenance  Topic Date Due   COVID-19 Vaccine (5 - 2024-25 season) 11/16/2022   Medicare Annual Wellness (AWV)  07/25/2023   OPHTHALMOLOGY EXAM  09/15/2023   Diabetic kidney evaluation - Urine ACR  09/22/2023   INFLUENZA VACCINE  10/16/2023   HEMOGLOBIN A1C  10/26/2023   Diabetic kidney evaluation - eGFR measurement  04/28/2024   FOOT EXAM  06/03/2024   Colonoscopy  05/14/2025   DTaP/Tdap/Td (2 - Td or Tdap) 04/20/2028   Pneumonia Vaccine 62+ Years old  Completed   DEXA SCAN  Completed   Hepatitis C Screening  Completed   Zoster Vaccines- Shingrix  Completed   HPV VACCINES  Aged Out   Meningococcal B Vaccine  Aged Out    Health Maintenance  Health Maintenance Due  Topic Date Due   COVID-19 Vaccine (5 - 2024-25 season) 11/16/2022   Medicare Annual Wellness (AWV)  07/25/2023    Colorectal cancer screening: Type of screening: Colonoscopy. Completed 05/14/2018. Repeat every 7 years  Mammogram status: No longer required due to age.  Bone Density status: Completed 09/20/2021. Results reflect: does not recall  Lung Cancer Screening: (Low Dose CT Chest recommended if Age 43-80 years, 20 pack-year currently smoking OR have quit w/in 15years.) does not qualify.   Lung Cancer Screening Referral: n/a  Additional Screening:  Hepatitis C Screening: does qualify; Completed 10/09/2016  Vision Screening: Recommended annual ophthalmology exams for early detection of glaucoma and other disorders of the eye. Is the patient up to date with their annual eye exam?  Yes  Who is the provider or what is the name of the office in which the patient attends annual eye exams? [facility]  Johnetta Nab If pt is not established with a provider, would they like to be referred to a provider to establish care? N/a  Dental Screening: Recommended annual dental exams for proper oral hygiene  Diabetic Foot Exam: Diabetic Foot Exam: Completed 06/04/2023  Community Resource Referral / Chronic Care Management: CRR required this visit?  No   CCM required this visit?  No     Plan:     I have personally reviewed and noted the following in the patient's chart:   Medical and social history Use of alcohol, tobacco or illicit drugs  Current medications and supplements including opioid prescriptions. Patient is not currently taking opioid prescriptions. Functional ability and status Nutritional status Physical activity Advanced directives List of other physicians  Hospitalizations, surgeries, and ER visits in previous 12 months Vitals Screenings to include cognitive, depression, and falls Referrals and appointments  In addition, I have reviewed and discussed with patient certain preventive protocols, quality metrics, and best practice recommendations. A written personalized care plan for preventive services as well as general preventive health recommendations were provided to patient.     Jenelle Mis, FNP   08/12/2023   After Visit Summary: (In Person-Printed) AVS printed and given to the patient  Nurse Notes: n/a

## 2023-08-12 NOTE — Assessment & Plan Note (Signed)
 Discussed that mammograms stop at age 78 per guidelines, using shared decision making will continue screening as her sister was diagnosed with breast cancer at age 71. She has been taking Estrogen therapy up until past few months.

## 2023-08-12 NOTE — Progress Notes (Signed)
 Complete physical exam  Patient: Brooke Castillo   DOB: [birth year]   78 y.o. Female  MRN: [id number]  Subjective:     Chief Complaint  Patient presents with   Annual Exam    AWV    VIVIANNE Castillo is a 78 y.o. female who presents today for a complete physical exam. She reports consuming a heart healthy diet. Exercise is limited by orthopedic condition(s): hip pain since DC estrogen. She generally feels well. She reports sleeping well. She does not have additional problems to discuss today.   HTN: followed by cardiology, next appt June 16 with Clem Currier, BP well controlled on amlodipine -olmesartan , reports of low HR several days per month as low as 40s with lightheadedness  DM: FBG <120, carb conscious diet, is losing weight, on Ozempic  0.5mg  weekly  HLD: pravastatin  20mg  daily, heart healthy diet, exercise limited by hip pain  Hip pain: right sided originating mid buttock and radiating to groin, similar to prior left sided hip pain for which she was seeing Ortho for in 2023, XR at that time showed X-rays plain radiographs show no fracture dislocation or other acute bony abnormality throughout the pelvis hips or lower back. No notable bone-on-bone changes of the hip joints themselves. There is signs of degenerative changes of the lumbosacral spine. No recent trauma or injury. Denies saddle numbness, incontinence of urine or stool. No numbness, tingling, or weakness. Pain is worse with hip flexion and rotation.   Most recent fall risk assessment:    08/12/2023    8:17 AM  Fall Risk   Falls in the past year? 0  Number falls in past yr: 0  Injury with Fall? 0  Risk for fall due to : Other (Comment)   Risk for fall due to: Comment age  Follow up Falls evaluation completed     Significant value     Most recent depression screenings:    08/12/2023    8:19 AM 04/28/2023    9:53 AM  PHQ 2/9 Scores  PHQ - 2 Score 5 0  PHQ- 9 Score 11 0    Vision:Within last year and Dental: No  current dental problems and Receives regular dental care  Patient Active Problem List   Diagnosis Date Noted   Physical exam, annual 08/12/2023   Chronic right hip pain 08/12/2023   Controlled type 2 diabetes mellitus without complication, without long-term current use of insulin (HCC) 08/12/2023   Encounter for screening mammogram for malignant neoplasm of breast 08/12/2023   Strain of muscle of right groin region 07/28/2023   Irregular heart rhythm 06/04/2023   Hyperlipidemia 06/04/2023   Encounter to establish care with new doctor 06/04/2023   (HFpEF) heart failure with preserved ejection fraction (HCC) 04/29/2023   Dizziness 04/29/2023   Palpitations 04/28/2023   Symptomatic bradycardia 04/28/2023   Leg swelling 09/08/2022   Erosive osteoarthritis of both hands 04/23/2022   DM2 (diabetes mellitus, type 2) (HCC) 04/23/2022   Current use of estrogen therapy 04/23/2022   HTN (hypertension) 07/20/2013   Mixed hyperlipidemia 07/20/2013   Obesity 07/20/2013   Past Medical History:  Diagnosis Date   Complication of anesthesia    IN 2011 I HAD COLON SURGERY & IT TOOK ME 2 DAYS TO WAKE UP    Diverticulosis    DM2 (diabetes mellitus, type 2) (HCC)    A1c in 12/16 was 6.6   GERD (gastroesophageal reflux disease)    Hx of colonic polyps    Hymenoptera allergy  Hyperlipidemia    Hypertension    LBBB (left bundle branch block)    transient   Obesity    Rheumatic fever    as a child   Past Surgical History:  Procedure Laterality Date   ABDOMINAL HYSTERECTOMY  1980   complete   APPENDECTOMY  1980   CHOLECYSTECTOMY  1989   COLON SURGERY  2011   removed 11 inches of colon   COLONOSCOPY  08/07/2008   Moderate sigmoid divertiuclosis. Small internal hemorrhoids. Otherwise normal colonoscopy   COLONOSCOPY     around 2017 with Dr Onesimo Bijou at [facility]   KNEE SURGERY Bilateral 2009   SINOSCOPY     TONSILLECTOMY     Social History   Tobacco Use   Smoking status:  Never    Passive exposure: Past   Smokeless tobacco: Never  Vaping Use   Vaping status: Never Used  Substance Use Topics   Alcohol use: No   Drug use: No   Family History  Problem Relation Age of Onset   Heart failure Mother    COPD Mother    Hypertension Mother    Coronary artery disease Father 34       CABGx5; brain stem stroke   Colon polyps Father    Hypertension Sister    Diabetes Sister    Breast cancer Sister    CAD Brother 21       stent   Bone cancer Brother    Prostate cancer Brother    Aneurysm Paternal Grandmother    Heart disease Paternal Grandfather    Colon cancer Neg Hx    Esophageal cancer Neg Hx    Stomach cancer Neg Hx    Rectal cancer Neg Hx    Allergies  Allergen Reactions   Bee Venom Anaphylaxis and Shortness Of Breath    Within minutes pt feels swelling and SOB, has been to ER for reaction   Celecoxib Other (See Comments)    SOB, weight gain      Ciprofloxacin Hives    Denies airway involvement   Codeine Other (See Comments)    "knocks me out", CNS change    Diflucan [Fluconazole] Hives    Denies airway involvement   Ezetimibe Other (See Comments)    Caused leg swelling and flushing     Flagyl [Metronidazole] Palpitations   Niaspan [Niacin Er (Antihyperlipidemic)] Other (See Comments)    Feels "flush" badly   Statins Other (See Comments)    Achey, muscle pain      Patient Care Team: Jenelle Mis, FNP as PCP - General (Family Medicine) Mealor, Donnamae Gaba, MD as PCP - Electrophysiology (Cardiology) Keenan Pastor (Optometry) Currence, Cordie Deters, PA-C (Orthopedic Surgery)   Outpatient Medications Prior to Visit  Medication Sig   albuterol  (PROVENTIL  HFA;VENTOLIN  HFA) 108 (90 BASE) MCG/ACT inhaler Inhale 1-2 puffs into the lungs every 4 (four) hours as needed for wheezing or shortness of breath.   amLODipine -olmesartan  (AZOR ) 5-40 MG tablet Take 1 tablet by mouth daily.   amoxicillin  (AMOXIL ) 500 MG capsule Take 4 capsules (2,000 mg  total) by mouth 1 hour before dental appointment.   cetirizine (ZYRTEC) 10 MG tablet Take 1 tablet by mouth daily.   cholecalciferol (VITAMIN D ) 1000 units tablet Take 1,000 Units by mouth daily.   CONTOUR NEXT TEST test strip USE TO CHECK BLOOD SUGAR LEVELS UP TO THREE TIMES A DAY   diclofenac Sodium (VOLTAREN) 1 % GEL Apply 2 g topically 4 (four) times daily.  EPINEPHrine  0.3 mg/0.3 mL IJ SOAJ injection Use as directed for life-threatening allergic reaction. (Patient taking differently: Inject 0.3 mg into the muscle once as needed (for life-threatening allergic reaction).)   fenofibrate  160 MG tablet Take 1 tablet (160 mg total) by mouth every evening.   fenofibrate  160 MG tablet Take 1 tablet (160 mg total) by mouth every evening.   fluticasone  (FLONASE) 50 MCG/ACT nasal spray Place 1-2 sprays into the nose daily.   furosemide  (LASIX ) 40 MG tablet TAKE 1 TABLET(40 MG) BY MOUTH DAILY as needed for swelling (Patient taking differently: Take 40 mg by mouth daily as needed for fluid.)   glipiZIDE  (GLUCOTROL  XL) 2.5 MG 24 hr tablet Take 1 tablet (2.5 mg total) by mouth daily.   Magnesium Gluconate 27.5 MG TABS Take by mouth.   meclizine  (ANTIVERT ) 12.5 MG tablet Take 1 tablet (12.5 mg total) by mouth 3 (three) times daily as needed for dizziness.   nystatin (MYCOSTATIN/NYSTOP) powder Apply 1 Application topically 2 (two) times daily as needed.   OZEMPIC , 0.25 OR 0.5 MG/DOSE, 2 MG/3ML SOPN INJECT 0.5 MG UNDER THE SKIN ONCE A WEEK   pravastatin  (PRAVACHOL ) 20 MG tablet TAKE 1 TABLET(20 MG) BY MOUTH AT BEDTIME (Patient taking differently: Take 20 mg by mouth at bedtime.)   pravastatin  (PRAVACHOL ) 20 MG tablet Take 1 tablet (20 mg total) by mouth at bedtime.   estradiol  (ESTRACE ) 0.5 MG tablet Take 1 tablet (0.5 mg total) by mouth every other day. (Patient not taking: Reported on 08/12/2023)   [DISCONTINUED] amLODipine -olmesartan  (AZOR ) 5-40 MG tablet Take 1 tablet by mouth daily.   [DISCONTINUED]  estradiol  (ESTRACE ) 0.5 MG tablet Take 1 tablet (0.5 mg total) by mouth every other day.   No facility-administered medications prior to visit.    Review of Systems  Constitutional: Negative.   HENT: Negative.    Eyes: Negative.   Respiratory: Negative.    Cardiovascular: Negative.   Gastrointestinal: Negative.   Genitourinary: Negative.   Musculoskeletal: Negative.   Skin: Negative.   Neurological: Negative.   Endo/Heme/Allergies: Negative.   Psychiatric/Behavioral: Negative.    All other systems reviewed and are negative.         Objective:     BP 135/82   Pulse (!) 59   Ht 5\' 3"  (1.6 m)   Wt 187 lb 9.6 oz (85.1 kg)   SpO2 97%   BMI 33.23 kg/m  BP Readings from Last 3 Encounters:  08/12/23 135/82  07/28/23 130/72  06/04/23 122/72   Wt Readings from Last 3 Encounters:  08/12/23 187 lb 9.6 oz (85.1 kg)  07/28/23 187 lb 6 oz (85 kg)  06/04/23 189 lb 8 oz (86 kg)      Physical Exam Vitals and nursing note reviewed.  Constitutional:      Appearance: Normal appearance. She is obese.  HENT:     Head: Normocephalic and atraumatic.     Right Ear: Tympanic membrane, ear canal and external ear normal.     Left Ear: Tympanic membrane, ear canal and external ear normal.     Nose: Nose normal.     Mouth/Throat:     Mouth: Mucous membranes are moist.     Pharynx: Oropharynx is clear.  Eyes:     Extraocular Movements: Extraocular movements intact.     Conjunctiva/sclera: Conjunctivae normal.     Pupils: Pupils are equal, round, and reactive to light.  Cardiovascular:     Rate and Rhythm: Normal rate and regular rhythm.  Pulses: Normal pulses.     Heart sounds: Normal heart sounds.  Pulmonary:     Effort: Pulmonary effort is normal.     Breath sounds: Normal breath sounds.  Abdominal:     General: Bowel sounds are normal.     Palpations: Abdomen is soft.  Musculoskeletal:     Cervical back: Normal range of motion and neck supple.     Lumbar back: Normal.      Right hip: Decreased range of motion.  Skin:    General: Skin is warm and dry.     Capillary Refill: Capillary refill takes less than 2 seconds.  Neurological:     General: No focal deficit present.     Mental Status: She is alert and oriented to person, place, and time. Mental status is at baseline.  Psychiatric:        Mood and Affect: Mood normal.        Behavior: Behavior normal.        Thought Content: Thought content normal.        Judgment: Judgment normal.      No results found for any visits on 08/12/23. Last CBC Lab Results  Component Value Date   WBC 8.1 04/29/2023   HGB 13.1 04/29/2023   HCT 37.5 04/29/2023   MCV 88.0 04/29/2023   MCH 30.8 04/29/2023   RDW 12.8 04/29/2023   PLT 222 04/29/2023   Last metabolic panel Lab Results  Component Value Date   GLUCOSE 118 (H) 04/29/2023   NA 137 04/29/2023   K 3.5 04/29/2023   CL 104 04/29/2023   CO2 24 04/29/2023   BUN 15 04/29/2023   CREATININE 0.68 04/29/2023   GFRNONAA >60 04/29/2023   CALCIUM  9.2 04/29/2023   PHOS 3.7 11/08/2009   PROT 7.5 04/09/2022   ALBUMIN 4.4 04/09/2022   BILITOT 0.4 04/09/2022   ALKPHOS 47 04/09/2022   AST 21 04/09/2022   ALT 16 04/09/2022   ANIONGAP 9 04/29/2023   Last lipids Lab Results  Component Value Date   CHOL 222 (H) 10/16/2022   HDL 39 (L) 10/16/2022   LDLCALC 91 10/16/2022   LDLDIRECT 97 08/18/2013   TRIG 559 (HH) 10/16/2022   CHOLHDL 5.7 (H) 10/16/2022   Last hemoglobin A1c Lab Results  Component Value Date   HGBA1C 5.8 04/28/2023   Last thyroid  functions Lab Results  Component Value Date   TSH 3.381 04/28/2023   Last vitamin D No results found for: "25OHVITD2", "25OHVITD3", "VD25OH" Last vitamin B12 and Folate No results found for: "VITAMINB12", "FOLATE"      Assessment & Plan:    Routine Health Maintenance and Physical Exam  Immunization History  Administered Date(s) Administered   Fluad Quad(high Dose 65+) 01/15/2022   Influenza, High  Dose Seasonal PF 01/13/2014   Influenza-Unspecified 01/27/2023   Moderna Sars-Covid-2 Vaccination 05/27/2019, 06/24/2019, 01/24/2020, 07/26/2020   Pneumococcal Conjugate-13 01/03/2016   Pneumococcal Polysaccharide-23 09/07/2007, 09/12/2008, 08/24/2017   Rsv, Bivalent, Protein Subunit Rsvpref,pf Pattricia Bores) 01/23/2022   Tdap 04/20/2018   Zoster Recombinant(Shingrix) 10/04/2021, 01/23/2022   Zoster, Live 05/27/2010    Health Maintenance  Topic Date Due   COVID-19 Vaccine (5 - 2024-25 season) 08/28/2023 (Originally 11/16/2022)   OPHTHALMOLOGY EXAM  09/15/2023   Diabetic kidney evaluation - Urine ACR  09/22/2023   INFLUENZA VACCINE  10/16/2023   HEMOGLOBIN A1C  10/26/2023   Diabetic kidney evaluation - eGFR measurement  04/28/2024   FOOT EXAM  06/03/2024   Medicare Annual Wellness (AWV)  08/11/2024  Colonoscopy  05/14/2025   DTaP/Tdap/Td (2 - Td or Tdap) 04/20/2028   Pneumonia Vaccine 22+ Years old  Completed   DEXA SCAN  Completed   Hepatitis C Screening  Completed   Zoster Vaccines- Shingrix  Completed   HPV VACCINES  Aged Out   Meningococcal B Vaccine  Aged Out    Discussed health benefits of physical activity, and encouraged her to engage in regular exercise appropriate for her age and condition.  Problem List Items Addressed This Visit     HTN (hypertension)   Chronic well controlled on amlodipine -olmesartan  540mg  daily. Followed by cardiology Clem Currier, next appt next month. Recommend heart healthy diet such as Mediterranean diet with whole grains, fruits, vegetable, fish, lean meats, nuts, and olive oil. Limit salt. Encouraged moderate walking, 3-5 times/week for 30-50 minutes each session. Aim for at least 150 minutes.week. Goal should be pace of 3 miles/hours, or walking 1.5 miles in 30 minutes. Avoid tobacco products. Avoid excess alcohol. Take medications as prescribed and bring medications and blood pressure log with cuff to each office visit. Seek medical care for chest  pain, palpitations, shortness of breath with exertion, dizziness/lightheadedness, vision changes, recurrent headaches, or swelling of extremities. Follow up in 6 months or sooner if needed.      Relevant Orders   CBC with Differential/Platelet   Comprehensive metabolic panel with GFR   Lipid panel   Hemoglobin A1c   VITAMIN D  25 Hydroxy (Vit-D Deficiency, Fractures)   Hyperlipidemia   Continue Pravastatin  20mg  daily. Recheck fasting labs at CPE. I recommend consuming a heart healthy diet such as Mediterranean diet or DASH diet with whole grains, fruits, vegetable, fish, lean meats, nuts, and olive oil. Limit sweets and processed foods. I also encourage moderate intensity exercise 150 minutes weekly. This is 3-5 times weekly for 30-50 minutes each session. Goal should be pace of 3 miles/hours, or walking 1.5 miles in 30 minutes.      Relevant Orders   Lipid panel   Physical exam, annual - Primary   Today your medical history was reviewed and routine physical exam with labs was performed. Recommend 150 minutes of moderate intensity exercise weekly and consuming a well-balanced diet. Advised to stop smoking if a smoker, avoid smoking if a non-smoker, limit alcohol consumption to 1 drink per day for women and 2 drinks per day for men, and avoid illicit drug use. Counseled on safe sex practices and offered STI testing today. Counseled on the importance of sunscreen use. Counseled in mental health awareness and when to seek medical care. Vaccine maintenance discussed. Appropriate health maintenance items reviewed. Return to office in 1 year for annual physical exam.       Relevant Orders   CBC with Differential/Platelet   Comprehensive metabolic panel with GFR   Lipid panel   Hemoglobin A1c   VITAMIN D  25 Hydroxy (Vit-D Deficiency, Fractures)   DG Lumbar Spine Complete   DG HIP UNILAT W OR W/O PELVIS 2-3 VIEWS RIGHT   DG Bone Density   Ambulatory referral to Orthopedic Surgery   Chronic  right hip pain   Suspect sciatic pain. Obtain x-ray hip and lumbar. Continue NSAIDs. Is performing PT exercises at home, declines PT at this time. Will refer back to ortho.      Relevant Orders   DG Lumbar Spine Complete   DG HIP UNILAT W OR W/O PELVIS 2-3 VIEWS RIGHT   Ambulatory referral to Orthopedic Surgery   Controlled type 2 diabetes mellitus without complication,  without long-term current use of insulin (HCC)   A1c today. Continue Ozempic  0.5mg  weekly.  A1c and uACR UTD. Foot exam UTD. Vaccines UTD. Retinal eye exam UTD. Recommend heart healthy diet such as Mediterranean diet with whole grains, fruits, vegetable, fish, lean meats, nuts, and olive oil. Limit salt. Encouraged moderate walking, 3-5 times/week for 30-50 minutes each session. Aim for at least 150 minutes.week. Goal should be pace of 3 miles/hours, or walking 1.5 miles in 30 minutes. Seek medical care for urinary frequency, extreme thirst, vision changes, lightheadedness, dizziness.  6 month follow up or sooner if needed      Encounter for screening mammogram for malignant neoplasm of breast   Discussed that mammograms stop at age 7 per guidelines, using shared decision making will continue screening as her sister was diagnosed with breast cancer at age 55. She has been taking Estrogen therapy up until past few months.      Relevant Orders   MM DIGITAL SCREENING BILATERAL   Other Visit Diagnoses       Encounter for osteoporosis screening in asymptomatic postmenopausal patient       Relevant Orders   DG Bone Density     Encounter for vitamin deficiency screening       Relevant Orders   VITAMIN D 25 Hydroxy (Vit-D Deficiency, Fractures)     Encounter for Medicare annual wellness exam          Return in 6 months (on 02/12/2024) for chronic follow-up with labs 1 week prior.     Jenelle Mis, FNP

## 2023-08-12 NOTE — Assessment & Plan Note (Signed)
 Suspect sciatic pain. Obtain x-ray hip and lumbar. Continue NSAIDs. Is performing PT exercises at home, declines PT at this time. Will refer back to ortho.

## 2023-08-12 NOTE — Assessment & Plan Note (Signed)

## 2023-08-12 NOTE — Assessment & Plan Note (Signed)
 Chronic well controlled on amlodipine -olmesartan  540mg  daily. Followed by cardiology Clem Currier, next appt next month. Recommend heart healthy diet such as Mediterranean diet with whole grains, fruits, vegetable, fish, lean meats, nuts, and olive oil. Limit salt. Encouraged moderate walking, 3-5 times/week for 30-50 minutes each session. Aim for at least 150 minutes.week. Goal should be pace of 3 miles/hours, or walking 1.5 miles in 30 minutes. Avoid tobacco products. Avoid excess alcohol. Take medications as prescribed and bring medications and blood pressure log with cuff to each office visit. Seek medical care for chest pain, palpitations, shortness of breath with exertion, dizziness/lightheadedness, vision changes, recurrent headaches, or swelling of extremities. Follow up in 6 months or sooner if needed.

## 2023-08-13 ENCOUNTER — Ambulatory Visit

## 2023-08-13 ENCOUNTER — Ambulatory Visit: Payer: Self-pay | Admitting: Family Medicine

## 2023-08-13 ENCOUNTER — Inpatient Hospital Stay: Admission: RE | Admit: 2023-08-13 | Source: Ambulatory Visit

## 2023-08-13 ENCOUNTER — Other Ambulatory Visit (HOSPITAL_COMMUNITY): Payer: Self-pay

## 2023-08-13 LAB — CBC WITH DIFFERENTIAL/PLATELET
Absolute Lymphocytes: 2042 {cells}/uL (ref 850–3900)
Absolute Monocytes: 469 {cells}/uL (ref 200–950)
Basophils Absolute: 28 {cells}/uL (ref 0–200)
Basophils Relative: 0.4 %
Eosinophils Absolute: 159 {cells}/uL (ref 15–500)
Eosinophils Relative: 2.3 %
HCT: 42.4 % (ref 35.0–45.0)
Hemoglobin: 14.3 g/dL (ref 11.7–15.5)
MCH: 31 pg (ref 27.0–33.0)
MCHC: 33.7 g/dL (ref 32.0–36.0)
MCV: 92 fL (ref 80.0–100.0)
MPV: 11.9 fL (ref 7.5–12.5)
Monocytes Relative: 6.8 %
Neutro Abs: 4202 {cells}/uL (ref 1500–7800)
Neutrophils Relative %: 60.9 %
Platelets: 221 10*3/uL (ref 140–400)
RBC: 4.61 10*6/uL (ref 3.80–5.10)
RDW: 12.3 % (ref 11.0–15.0)
Total Lymphocyte: 29.6 %
WBC: 6.9 10*3/uL (ref 3.8–10.8)

## 2023-08-13 LAB — COMPREHENSIVE METABOLIC PANEL WITH GFR
AG Ratio: 1.9 (calc) (ref 1.0–2.5)
ALT: 15 U/L (ref 6–29)
AST: 20 U/L (ref 10–35)
Albumin: 4.3 g/dL (ref 3.6–5.1)
Alkaline phosphatase (APISO): 40 U/L (ref 37–153)
BUN: 18 mg/dL (ref 7–25)
CO2: 29 mmol/L (ref 20–32)
Calcium: 9.3 mg/dL (ref 8.6–10.4)
Chloride: 105 mmol/L (ref 98–110)
Creat: 0.78 mg/dL (ref 0.60–1.00)
Globulin: 2.3 g/dL (ref 1.9–3.7)
Glucose, Bld: 97 mg/dL (ref 65–99)
Potassium: 4.4 mmol/L (ref 3.5–5.3)
Sodium: 142 mmol/L (ref 135–146)
Total Bilirubin: 0.5 mg/dL (ref 0.2–1.2)
Total Protein: 6.6 g/dL (ref 6.1–8.1)
eGFR: 79 mL/min/{1.73_m2} (ref >=60)

## 2023-08-13 LAB — LIPID PANEL
Cholesterol: 154 mg/dL (ref <200)
HDL: 50 mg/dL (ref >=50)
LDL Cholesterol (Calc): 72 mg/dL
Non-HDL Cholesterol (Calc): 104 mg/dL (ref <130)
Total CHOL/HDL Ratio: 3.1 (calc) (ref <5.0)
Triglycerides: 230 mg/dL — ABNORMAL HIGH (ref <150)

## 2023-08-13 LAB — HEMOGLOBIN A1C
Hgb A1c MFr Bld: 5.8 % — ABNORMAL HIGH (ref <5.7)
Mean Plasma Glucose: 120 mg/dL
eAG (mmol/L): 6.6 mmol/L

## 2023-08-13 LAB — VITAMIN D 25 HYDROXY (VIT D DEFICIENCY, FRACTURES): Vit D, 25-Hydroxy: 37 ng/mL (ref 30–100)

## 2023-08-19 ENCOUNTER — Ambulatory Visit
Admission: RE | Admit: 2023-08-19 | Discharge: 2023-08-19 | Disposition: A | Discharge status: Home / Self Care | Source: Ambulatory Visit | Attending: Family Medicine | Admitting: Family Medicine

## 2023-08-19 ENCOUNTER — Other Ambulatory Visit (HOSPITAL_COMMUNITY): Payer: Self-pay

## 2023-08-19 DIAGNOSIS — Z0000 Encounter for general adult medical examination without abnormal findings: Secondary | ICD-10-CM

## 2023-08-19 DIAGNOSIS — N958 Other specified menopausal and perimenopausal disorders: Secondary | ICD-10-CM | POA: Diagnosis not present

## 2023-08-19 DIAGNOSIS — Z13820 Encounter for screening for osteoporosis: Secondary | ICD-10-CM

## 2023-08-19 MED ORDER — OZEMPIC (0.25 OR 0.5 MG/DOSE) 2 MG/3ML ~~LOC~~ SOPN
0.5000 mg | PEN_INJECTOR | SUBCUTANEOUS | 2 refills | Status: DC
  Filled 2023-08-19: qty 3, 28d supply, fill #0

## 2023-08-24 ENCOUNTER — Encounter (HOSPITAL_COMMUNITY): Payer: Self-pay

## 2023-08-24 ENCOUNTER — Emergency Department (HOSPITAL_COMMUNITY)

## 2023-08-24 ENCOUNTER — Emergency Department (HOSPITAL_COMMUNITY)
Admission: EM | Admit: 2023-08-24 | Discharge: 2023-08-24 | Disposition: A | Discharge status: Home / Self Care | Attending: Emergency Medicine | Admitting: Emergency Medicine

## 2023-08-24 ENCOUNTER — Other Ambulatory Visit: Payer: Self-pay

## 2023-08-24 ENCOUNTER — Telehealth: Payer: Self-pay | Admitting: Cardiovascular Disease

## 2023-08-24 DIAGNOSIS — R001 Bradycardia, unspecified: Secondary | ICD-10-CM

## 2023-08-24 DIAGNOSIS — R55 Syncope and collapse: Secondary | ICD-10-CM | POA: Diagnosis not present

## 2023-08-24 DIAGNOSIS — I447 Left bundle-branch block, unspecified: Secondary | ICD-10-CM

## 2023-08-24 DIAGNOSIS — I9589 Other hypotension: Secondary | ICD-10-CM

## 2023-08-24 LAB — COMPREHENSIVE METABOLIC PANEL WITH GFR
ALT: 20 U/L (ref 0–44)
AST: 27 U/L (ref 15–41)
Albumin: 4 g/dL (ref 3.5–5.0)
Alkaline Phosphatase: 41 U/L (ref 38–126)
Anion gap: 7 (ref 5–15)
BUN: 20 mg/dL (ref 8–23)
CO2: 27 mmol/L (ref 22–32)
Calcium: 9.1 mg/dL (ref 8.9–10.3)
Chloride: 104 mmol/L (ref 98–111)
Creatinine, Ser: 0.89 mg/dL (ref 0.44–1.00)
GFR, Estimated: >60 mL/min (ref >60)
Glucose, Bld: 95 mg/dL (ref 70–99)
Potassium: 4.1 mmol/L (ref 3.5–5.1)
Sodium: 138 mmol/L (ref 135–145)
Total Bilirubin: 0.6 mg/dL (ref 0.0–1.2)
Total Protein: 7.2 g/dL (ref 6.5–8.1)

## 2023-08-24 LAB — URINALYSIS, ROUTINE W REFLEX MICROSCOPIC
Bilirubin Urine: NEGATIVE
Glucose, UA: NEGATIVE mg/dL
Hgb urine dipstick: NEGATIVE
Ketones, ur: NEGATIVE mg/dL
Leukocytes,Ua: NEGATIVE
Nitrite: NEGATIVE
Protein, ur: NEGATIVE mg/dL
Specific Gravity, Urine: 1.009 (ref 1.005–1.030)
pH: 5 (ref 5.0–8.0)

## 2023-08-24 LAB — CBC
HCT: 45.5 % (ref 36.0–46.0)
Hemoglobin: 15.2 g/dL — ABNORMAL HIGH (ref 12.0–15.0)
MCH: 31 pg (ref 26.0–34.0)
MCHC: 33.4 g/dL (ref 30.0–36.0)
MCV: 92.7 fL (ref 80.0–100.0)
Platelets: 266 10*3/uL (ref 150–400)
RBC: 4.91 MIL/uL (ref 3.87–5.11)
RDW: 12.1 % (ref 11.5–15.5)
WBC: 9.1 10*3/uL (ref 4.0–10.5)
nRBC: 0 % (ref 0.0–0.2)

## 2023-08-24 LAB — TROPONIN I (HIGH SENSITIVITY)
Troponin I (High Sensitivity): 7 ng/L (ref <18)
Troponin I (High Sensitivity): 7 ng/L (ref <18)

## 2023-08-24 LAB — CBG MONITORING, ED: Glucose-Capillary: 80 mg/dL (ref 70–99)

## 2023-08-24 NOTE — ED Triage Notes (Addendum)
 Pt states she has had 4 days of feeling lightheaded and her heart rate dropping into 30s. Pt states she has blurred vision with this as well. Pt denies nausea, vomiting, shortness of breath or pain.

## 2023-08-24 NOTE — ED Provider Notes (Signed)
 [place] EMERGENCY DEPARTMENT AT [facility] Provider Note   CSN: [id number] Arrival date & time: 08/24/23  1200     History  Chief Complaint  Patient presents with   Bradycardia    Brooke Castillo is a 78 y.o. female.  HPI medical history significant for sinus bradycardia, sinus arrhythmia, left bundle branch block with evaluation by Dr. Arlester Ladd EP.  She was diagnosed with neurocardiogenic syncope with episodes of dizziness and lightheadedness occurring during sinus rhythm but also episodes of bradycardia as low as 30 bpm.  Patient reports that she has become more symptomatic over the past few weeks.  She reports this morning she had about 3-4 episodes of blood pressures dropping into the 30s.  She reports when that happens she can feel her strength drained and she feels like she is just washing out and gets lightheaded.  She feels like she will get close to passing out but has not.  Patient denies any chest pain or shortness of breath.  She has tried to increase her activity level and has gotten up to walking up to a mile and a half at at a time.  She reports that the episodes of lightheadedness and heart rate drop seem to be more frequent at rest.    Home Medications Prior to Admission medications   Medication Sig Start Date End Date Taking? Authorizing Provider  albuterol  (PROVENTIL  HFA;VENTOLIN  HFA) 108 (90 BASE) MCG/ACT inhaler Inhale 1-2 puffs into the lungs every 4 (four) hours as needed for wheezing or shortness of breath.   Yes [provider]  amLODipine -olmesartan  (AZOR ) 5-40 MG tablet Take 1 tablet by mouth daily. 01/12/23  Yes Clem Currier, DO  cetirizine (ZYRTEC) 10 MG tablet Take 1 tablet by mouth daily. 01/03/16  Yes [provider]  cholecalciferol (VITAMIN D ) 1000 units tablet Take 1,000 Units by mouth daily.   Yes [provider]  diclofenac Sodium (VOLTAREN) 1 % GEL Apply 2 g topically 4 (four) times daily as needed. 02/13/22  Yes  [provider]  EPINEPHrine  0.3 mg/0.3 mL IJ SOAJ injection Use as directed for life-threatening allergic reaction. Patient taking differently: Inject 0.3 mg into the muscle once as needed (for life-threatening allergic reaction). 01/31/16  Yes Kozlow, Rema Care, MD  fenofibrate  160 MG tablet Take 1 tablet (160 mg total) by mouth every evening. 06/11/23  Yes   fluticasone  (FLONASE) 50 MCG/ACT nasal spray Place 1-2 sprays into the nose daily. 03/28/21  Yes [provider]  furosemide  (LASIX ) 40 MG tablet TAKE 1 TABLET(40 MG) BY MOUTH DAILY as needed for swelling Patient taking differently: Take 40 mg by mouth daily as needed for fluid. 02/18/23  Yes Ivin Marrow, MD  pravastatin  (PRAVACHOL ) 20 MG tablet Take 1 tablet (20 mg total) by mouth at bedtime. 04/21/23  Yes Clem Currier, DO  Semaglutide ,0.25 or 0.5MG /DOS, (OZEMPIC , 0.25 OR 0.5 MG/DOSE,) 2 MG/3ML SOPN Inject 0.5 mg into the skin once a week. 05/28/23  Yes   amoxicillin  (AMOXIL ) 500 MG capsule Take 4 capsules (2,000 mg total) by mouth 1 hour before dental appointment. 10/30/22     CONTOUR NEXT TEST test strip USE TO CHECK BLOOD SUGAR LEVELS UP TO THREE TIMES A DAY 07/07/23   Austine Lefort, MD  estradiol  (ESTRACE ) 0.5 MG tablet Take 1 tablet (0.5 mg total) by mouth every other day. Patient not taking: No sig reported 06/11/23 12/08/23  Jenelle Mis, FNP      Allergies    Bee venom,  Celecoxib, Ciprofloxacin, Codeine, Diflucan [fluconazole], Ezetimibe, Flagyl [metronidazole], Niaspan [niacin er (antihyperlipidemic)], and Statins    Review of Systems   Review of Systems  Physical Exam Updated Vital Signs BP (!) 145/56   Pulse 68   Temp 97.6 F (36.4 C) (Oral)   Resp 16   Ht 5' 2.5" (1.588 m)   Wt 83.5 kg   SpO2 96%   BMI 33.12 kg/m  Physical Exam Constitutional:      Appearance: Normal appearance.     Comments: Patient is alert nontoxic.  Clinically well in appearance.  Well-nourished well-developed.  No  respiratory distress.  HENT:     Mouth/Throat:     Pharynx: Oropharynx is clear.  Eyes:     Extraocular Movements: Extraocular movements intact.  Cardiovascular:     Rate and Rhythm: Normal rate and regular rhythm.  Pulmonary:     Effort: Pulmonary effort is normal.     Breath sounds: Normal breath sounds.  Abdominal:     General: There is no distension.     Palpations: Abdomen is soft.     Tenderness: There is no abdominal tenderness. There is no guarding.  Musculoskeletal:        General: No swelling or tenderness. Normal range of motion.     Right lower leg: No edema.     Left lower leg: No edema.  Skin:    General: Skin is warm and dry.  Neurological:     General: No focal deficit present.     Mental Status: She is alert and oriented to person, place, and time.     Motor: No weakness.     Coordination: Coordination normal.  Psychiatric:        Mood and Affect: Mood normal.     ED Results / Procedures / Treatments   Labs (all labs ordered are listed, but only abnormal results are displayed) Labs Reviewed  CBC - Abnormal; Notable for the following components:      Result Value   Hemoglobin 15.2 (*)    All other components within normal limits  URINALYSIS, ROUTINE W REFLEX MICROSCOPIC - Abnormal; Notable for the following components:   APPearance HAZY (*)    All other components within normal limits  COMPREHENSIVE METABOLIC PANEL WITH GFR  CBG MONITORING, ED  TROPONIN I (HIGH SENSITIVITY)  TROPONIN I (HIGH SENSITIVITY)    EKG EKG Interpretation Date/Time:  Monday August 24 2023 12:10:57 EDT Ventricular Rate:  60 PR Interval:  242 QRS Duration:  136 QT Interval:  438 QTC Calculation: 438 R Axis:   -47  Text Interpretation: Sinus rhythm with 1st degree A-V block Left axis deviation Left bundle branch block Abnormal ECG When compared with ECG of 28-May-2023 10:45, PREVIOUS ECG IS PRESENT no sig change from previous Confirmed by Wynetta Heckle [phone]) on 08/24/2023  4:29:15 PM  Radiology DG Chest 2 View Result Date: 08/24/2023 CLINICAL DATA:  Bradycardia EXAM: CHEST - 2 VIEW COMPARISON:  Chest x-ray performed April 28, 2023 FINDINGS: The heart size and mediastinal contours are within normal limits. Both lungs are clear. The visualized skeletal structures are unremarkable. Degenerative changes in the thoracic spine. IMPRESSION: No active cardiopulmonary disease. Electronically Signed   By: Reagan Camera M.D.   On: 08/24/2023 13:07    Procedures Procedures    Medications Ordered in ED Medications - No data to display  ED Course/ Medical Decision Making/ A&P  Medical Decision Making Amount and/or Complexity of Data Reviewed Labs: ordered. Radiology: ordered.  Risk Decision regarding hospitalization.   Patient presents as outlined.  She has become more symptomatic with the sporadic bradycardia.  On her monitor at home she is documented 3 episodes this morning.  At this time agree with proceeding with basic lab work and EKG and chest x-ray.  Urinalysis negative.  Troponin normal.  CBC normal normal metabolic panel with normal LFTs.  Consult: Cardiology reviewed with Dr. Selwyn Dalton.  He has reviewed the EMR with prior evaluations by EP and current presentation.  He will do consult in the ED.  Patient is remained stable.  Plan is in place for outpatient follow-up with EP.        Final Clinical Impression(s) / ED Diagnoses Final diagnoses:  Bradycardia  Symptomatic bradycardia    Rx / DC Orders ED Discharge Orders     None         Wynetta Heckle, MD 08/24/23 2110

## 2023-08-24 NOTE — Telephone Encounter (Signed)
 Stat call. Patient states she has a history of dizziness and low HR which was seen on a monitor on 04/2023. Last OV was 05/2023. She states she has chronic issues with dizziness b ut she has been experiencing 10 episodes a day of dizziness since Friday. She reports she has been tracking her HR since Friday and it has been 30-55 BPM. She states she notices that when she feels dizzy her HR tends to be lower. I had her check her BP and it was 143/67.  She states the episodes of dizziness last 4-7 seconds but she fears she will pass out and they are happening more frequently than usual. She also reports "I can tell my heart rate is irregular".  She denies any chest pain, SOB or leg swelling. She states she has a non-productive cough today and does endorse blurry vision. Patient states she has been drinking water and this has helped a little. Advised patient to call 911 or go to ER. Patient verbalizes understanding and states her son is with her and is willing to take her to ER.

## 2023-08-24 NOTE — ED Provider Triage Note (Signed)
 Emergency Medicine Provider Triage Evaluation Note  Brooke Castillo , a 78 y.o. female  was evaluated in triage.  Pt complains of heart rate dropping, 4 days, can be sitting and start to feel dizzy and light headed. Is HR goes to 30s, eyes get blurry. Wears a smart watch and uses BP cuff to monitor. BP has been ok throughout.  Sees Dr. Andres Kea (across the street). Takes amlodipine . Review of Systems  Positive:  Negative:   Physical Exam  BP 136/71 (BP Location: Right Arm)   Pulse (!) 55   Temp 97.8 F (36.6 C) (Oral)   Resp 16   Ht 5' 2.5" (1.588 m)   Wt 83.5 kg   SpO2 97%   BMI 33.12 kg/m  Gen:   Awake, no distress   Resp:  Normal effort  MSK:   Moves extremities without difficulty  Other:    Medical Decision Making  Medically screening exam initiated at 12:24 PM.  Appropriate orders placed.  Brooke Castillo was informed that the remainder of the evaluation will be completed by another provider, this initial triage assessment does not replace that evaluation, and the importance of remaining in the ED until their evaluation is complete.     Darlis Eisenmenger, PA-C 08/24/23 1226

## 2023-08-24 NOTE — Telephone Encounter (Signed)
 STAT if HR is under 50 or over 120  (normal HR is 60-100 beats per minute)  What is your heart rate? 39 currently   Do you have a log of your heart rate readings (document readings)?   6/9: 9:15am - hr 37         9:25am -   hr 81         9:35am -  hr 49          9:45am -  hr 54          10:10am -  hr 39           Do you have any other symptoms?  Lightheaded

## 2023-08-24 NOTE — Consult Note (Signed)
 Cardiology Consultation   Patient ID: Brooke Castillo MRN: [id number]; DOB: [birth year]  Admit date: 08/24/2023 Date of Consult: 08/24/2023  PCP:  Brooke Mis, FNP   [facility] Providers Cardiologist:  None  Electrophysiologist:  Brooke Grange, MD       Patient Profile: Brooke Castillo is a 78 y.o. female with a hx of neurocardiogenic syncope who is being seen 08/24/2023 for the evaluation of near syncope at the request of Dr. Daivd Castillo.  History of Present Illness: Brooke Castillo with hypertension, hyperlipidemia, type two diabetes, asthma, and a left bundle branch block who presents with lightheadedness and bradycardia. She is accompanied by her son and daughter-in-law, Brooke Castillo), who works at the cath lab. She was referred by Dr. Annabell Castillo for evaluation of her recurrent episodes of lightheadedness and bradycardia.  She has been experiencing lightheadedness for the past four days, with heart rates dropping to the thirties and associated blurred vision. These episodes have been occurring more frequently, now every three weeks instead of four, and last for about four days. She notes that these episodes often occur when she is less active.  Previous evaluations included a heart monitor showing bradycardia with brief pauses, none greater than five seconds, with the longest being approximately three seconds. She has tried compression stockings without relief.  Her past medical history includes hypertension, hyperlipidemia, type two diabetes, asthma, and a left bundle branch block. She has been evaluated multiple times for her symptoms, including a cardiac catheterization which was clean, and blood work showing normal glucose, BMP, CMP, and CBC with a slightly increased hemoglobin. Two high sensitivity troponins were negative. An EKG showed sinus bradycardia with first degree heart block and a left bundle branch block.  She mentions a recent event  where she was very active during her granddaughter's wedding, which might have contributed to her current symptoms. No pattern related to temperature changes or dietary factors has been noticed.  She is currently on medications for her chronic conditions, but specific medications and dosages were not discussed.   Past Medical History:  Diagnosis Date   Complication of anesthesia    IN 2011 I HAD COLON SURGERY & IT TOOK ME 2 DAYS TO WAKE UP    Diverticulosis    DM2 (diabetes mellitus, type 2) (HCC)    A1c in 12/16 was 6.6   GERD (gastroesophageal reflux disease)    Hx of colonic polyps    Hymenoptera allergy    Hyperlipidemia    Hypertension    LBBB (left bundle branch block)    transient   Obesity    Rheumatic fever    as a child    Past Surgical History:  Procedure Laterality Date   ABDOMINAL HYSTERECTOMY  1980   complete   APPENDECTOMY  1980   CHOLECYSTECTOMY  1989   COLON SURGERY  2011   removed 11 inches of colon   COLONOSCOPY  08/07/2008   Moderate sigmoid divertiuclosis. Small internal hemorrhoids. Otherwise normal colonoscopy   COLONOSCOPY     around 2017 with Dr Onesimo Bijou at [facility]   KNEE SURGERY Bilateral 2009   SINOSCOPY     TONSILLECTOMY      Allergies:    Allergies  Allergen Reactions   Bee Venom Anaphylaxis and Shortness Of Breath    Within minutes pt feels swelling and SOB, has been to ER for reaction   Celecoxib Other (See Comments)    SOB, weight gain  Ciprofloxacin Hives    Denies airway involvement   Codeine Other (See Comments)    "knocks me out", CNS change    Diflucan [Fluconazole] Hives    Denies airway involvement   Ezetimibe Other (See Comments)    Caused leg swelling and flushing     Flagyl [Metronidazole] Palpitations   Niaspan [Niacin Er (Antihyperlipidemic)] Other (See Comments)    Feels "flush" badly   Statins Other (See Comments)    Achey, muscle pain    Social History:   Social History   Socioeconomic  History   Marital status: Widowed    Spouse name: Not on file   Number of children: 2   Years of education: Not on file   Highest education level: Associate degree: occupational, Scientist, product/process development, or vocational program  Occupational History   Occupation: Retired   Tobacco Use   Smoking status: Never    Passive exposure: Past   Smokeless tobacco: Never  Vaping Use   Vaping status: Never Used  Substance and Sexual Activity   Alcohol use: No   Drug use: No   Sexual activity: Not on file  Other Topics Concern   Not on file  Social History Narrative   Lives with son, Brooke Castillo, and 2 dogs   Reports son, Brooke Castillo, will be the one to make medical decisions for her if she was unable to do so   Social Drivers of [employer]: Low Risk  (08/12/2023)   Overall Castillo Resource Strain (CARDIA)    Difficulty of Paying Living Expenses: Not hard at all  Food Insecurity: No Food Insecurity (08/12/2023)   Hunger Vital Sign    Worried About Running Out of Food in the Last Year: Never true    Ran Out of Food in the Last Year: Never true  Transportation Needs: No Transportation Needs (08/12/2023)   PRAPARE - Administrator, Civil Service (Medical): No    Lack of Transportation (Non-Medical): No  Physical Activity: Sufficiently Active (08/12/2023)   Exercise Vital Sign    Days of Exercise per Week: 5 days    Minutes of Exercise per Session: 50 min  Stress: No Stress Concern Present (08/12/2023)   [employer] of Occupational Health - Occupational Stress Questionnaire    Feeling of Stress : Only a little  Social Connections: Moderately Integrated (08/12/2023)   Social Connection and Isolation Panel [NHANES]    Frequency of Communication with Friends and Family: More than three times a week    Frequency of Social Gatherings with Friends and Family: More than three times a week    Attends Religious Services: More than 4 times per year    Active Member of [employer] [place] or  Organizations: Yes    Attends Banker Meetings: More than 4 times per year    Marital Status: Widowed  Intimate Partner Violence: Not At Risk (08/12/2023)   Humiliation, Afraid, Rape, and Kick questionnaire    Fear of Current or Ex-Partner: No    Emotionally Abused: No    Physically Abused: No    Sexually Abused: No    Family History:    Family History  Problem Relation Age of Onset   Heart failure Mother    COPD Mother    Hypertension Mother    Coronary artery disease Father 67       CABGx5; brain stem stroke   Colon polyps Father    Hypertension Sister    Diabetes Sister    Breast cancer  Sister    CAD Brother 62       stent   Bone cancer Brother    Prostate cancer Brother    Aneurysm Paternal Grandmother    Heart disease Paternal Grandfather    Colon cancer Neg Hx    Esophageal cancer Neg Hx    Stomach cancer Neg Hx    Rectal cancer Neg Hx      ROS:  Please see the history of present illness.    Physical Exam/Data: Vitals:   08/24/23 1218 08/24/23 1705 08/24/23 1800 08/24/23 2000  BP:  (!) 122/59 (!) 146/62 (!) 145/56  Pulse:  (!) 56 (!) 58 68  Resp:  15  16  Temp:  (!) 97.4 F (36.3 C)  97.6 F (36.4 C)  TempSrc:  Oral  Oral  SpO2:  96% 94% 96%  Weight: 83.5 kg     Height: 5' 2.5" (1.588 m)      No intake or output data in the 24 hours ending 08/24/23 2057    08/24/2023   12:18 PM 08/12/2023    7:55 AM 07/28/2023    8:40 AM  Last 3 Weights  Weight (lbs) 184 lb 187 lb 9.6 oz 187 lb 6 oz  Weight (kg) 83.462 kg 85.095 kg 84.993 kg     Body mass index is 33.12 kg/m.  General:  Well nourished, well developed, in no acute distress HEENT: normal Neck: no JVD Vascular: No carotid bruits; Distal pulses 2+ bilaterally Cardiac:  normal S1, S2; RRR; systolic murmur Lungs:  clear to auscultation bilaterally, no wheezing, rhonchi or rales  Abd: soft, nontender, no hepatomegaly  Ext: no edema Musculoskeletal:  No deformities, BUE and BLE strength  normal and equal Skin: warm and dry  Neuro:  CNs 2-12 intact, no focal abnormalities noted Psych:  Normal affect   LABS Glucose: Normal BMP: Normal CMP: Normal High sensitivity troponins: Negative Hemoglobin: Slightly increased CBC: Otherwise normal  RADIOLOGY Chest x-ray: No active cardiopulmonary disease  DIAGNOSTIC Cardiac catheterization: Clean (08/24/2023) EKG: Sinus bradycardia with first-degree heart block and left bundle branch block Telemetry: Heart rate 64 with first-degree heart block, rare heart rates to 56, no evidence of high-grade AV block  Laboratory Data: High Sensitivity Troponin:   Recent Labs  Lab 08/24/23 1220 08/24/23 1735  TROPONINIHS 7 7     Chemistry Recent Labs  Lab 08/24/23 1220  NA 138  K 4.1  CL 104  CO2 27  GLUCOSE 95  BUN 20  CREATININE 0.89  CALCIUM  9.1  GFRNONAA >60  ANIONGAP 7    Recent Labs  Lab 08/24/23 1220  PROT 7.2  ALBUMIN 4.0  AST 27  ALT 20  ALKPHOS 41  BILITOT 0.6    Hematology Recent Labs  Lab 08/24/23 1220  WBC 9.1  RBC 4.91  HGB 15.2*  HCT 45.5  MCV 92.7  MCH 31.0  MCHC 33.4  RDW 12.1  PLT 266     Radiology/Studies:  DG Chest 2 View Result Date: 08/24/2023 CLINICAL DATA:  Bradycardia EXAM: CHEST - 2 VIEW COMPARISON:  Chest x-ray performed April 28, 2023 FINDINGS: The heart size and mediastinal contours are within normal limits. Both lungs are clear. The visualized skeletal structures are unremarkable. Degenerative changes in the thoracic spine. IMPRESSION: No active cardiopulmonary disease. Electronically Signed   By: Reagan Camera M.D.   On: 08/24/2023 13:07     Assessment and Plan:  Neurocardiogenic syncope Recurrent episodes of lightheadedness and bradycardia, with heart rates dropping to the  30s, associated with blurred vision. Symptoms occur every three to four weeks, with the current episode lasting four days. Previous evaluations by electrophysiologist did not recommend pacemaker  placement. Symptoms are consistent with neurocardiogenic syncope, likely related to autonomic nervous system dysfunction. No significant AV block observed on telemetry. Pacemaker not recommended unless extreme circumstances arise. Admission not recommended due to lack of significant findings and potential for prolonged hospital stay without benefit. - Educate on counter maneuvers such as bearing down and hand grip  to increase systemic vascular resistance during episodes. - discussed both compression stockings and abdominal binder - Communicate with Dr. Arlester Ladd regarding current evaluation and management plan. - Discharge home with close follow-up (has visit on Monday)  Bradycardia Sinus bradycardia with heart rates dropping to the 30s during episodes. No significant AV block on telemetry. Bradycardia episodes are part of the neurocardiogenic syncope presentation. Pacemaker not indicated at this time due to lack of significant AV block and potential autonomic nervous system involvement.  Left bundle branch block Chronic left bundle branch block noted on EKG. No acute changes observed. No immediate intervention required.  Hypertension Blood pressure readings are slightly elevated on average. Current management may not be adjusted due to the risk of exacerbating syncope symptoms. De-escalation of antihypertensives is not feasible at this time due to the need to maintain adequate blood pressure control. - Continue current antihypertensive regimen. - Monitor blood pressure regularly.  Discussed with family at length  For questions or updates, please contact [facility] Please consult [email or web address] for contact info under    Gloriann Larger, MD FASE [facility] Cardiologist [facility]  [address] [place], [place] [address] [phone]  9:00 PM

## 2023-08-26 ENCOUNTER — Other Ambulatory Visit: Payer: Self-pay

## 2023-08-26 DIAGNOSIS — D2239 Melanocytic nevi of other parts of face: Secondary | ICD-10-CM | POA: Diagnosis not present

## 2023-08-26 DIAGNOSIS — L821 Other seborrheic keratosis: Secondary | ICD-10-CM | POA: Diagnosis not present

## 2023-08-30 NOTE — Progress Notes (Unsigned)
 Electrophysiology Office Note:    Date:  08/31/2023   ID:  Brooke Castillo, DOB 05/06/1945, MRN [id number]  PCP:  Jenelle Mis, FNP    [facility] Providers Cardiologist:  None Electrophysiologist:  Efraim Grange, MD     Referring MD: Clem Currier, DO   History of Present Illness:    Brooke Castillo is a 78 y.o. female with a medical history significant for sinus bradycardia, sinus arrhythmia, left bundle branch block, who presents for EP follow-up.     She has a history of left bundle branch block.  She was admitted in February 2025 with paroxysms of lightheadedness.  She had worn a monitor that showed bradycardia with brief pauses, longest about 3 seconds.  When I met her in the hospital, she experienced an episode of dizziness and presyncope while sitting in the bed.  Her telemetry showed normal sinus rhythm with a rate of about 60 bpm.  We deferred pacemaker at that time considering that she had neurocardiogenic cause for her lightheadedness with a strong vasodilatory component.  She was symptomatic with normal heart rate indicated that a pacemaker would not be of significant benefit.  She was discharged with a monitor that showed multiple episodes of dizziness and lightheadedness correlating with normal sinus rhythm.  She was admitted again in June 2025 with recurrent symptoms of dizziness and lightheadedness.  Again she occasionally had episodes of bradycardia.      Today, she presents for follow-up.  She is accompanied by her son and daughter-in-law, Burdette Carolin, who worked in our [employer] for some time.  EKGs/Labs/Other Studies Reviewed Today:     Echocardiogram:  TTE February 2025 50 to 55%.   Monitors:  14 day monitor 04/2023  -- my interpretation Sinus rhythm, heart rate 27 to 110 bpm, average 62 bpm Multiple symptom episodes reported.  Multiple episodes of dizziness and lightheaded correlated with normal sinus rhythm.  Episodes correlated with  supraventricular ectopy.  Some episodes of dizziness and lightheadedness correlated with sinus bradycardia.  1 event included a pause of 3.9 seconds.   EKG:   EKG Interpretation Date/Time:  Monday August 31 2023 09:12:25 EDT Ventricular Rate:  58 PR Interval:  240 QRS Duration:  142 QT Interval:  452 QTC Calculation: 443 R Axis:   -50  Text Interpretation: Sinus bradycardia with 1st degree A-V block Left axis deviation Left bundle branch block When compared with ECG of 24-Aug-2023 17:03, No significant change was found Confirmed by Marlane Silver [phone]) on 08/31/2023 9:30:18 AM     Physical Exam:    VS:  BP (!) 144/72   Pulse (!) 58   Ht 5' 2.5 (1.588 m)   Wt 186 lb (84.4 kg)   SpO2 98%   BMI 33.48 kg/m     Wt Readings from Last 3 Encounters:  08/31/23 186 lb (84.4 kg)  08/24/23 184 lb (83.5 kg)  08/12/23 187 lb 9.6 oz (85.1 kg)     GEN: Well nourished, well developed in no acute distress CARDIAC: RRR, no murmurs, rubs, gallops RESPIRATORY:  Normal work of breathing MUSCULOSKELETAL: no edema    ASSESSMENT & PLAN:     Dizziness, lightheadedness --neurocardiogenic syncope She has not had frank syncope Multiple episodes of dizziness and lightheadedness that have occurred during sinus rhythm with normal rates She has also had episodes of bradycardia, sometimes with rates as low as 30 bpm. I do not think a pacemaker will significantly improve her symptoms We discussed options moving forward I  advised her to remain active and exercise regularly, to push fluids, to consume a reasonable amount of salt, to wear compressions stockings, and to lie down supine at the earliest onset of symptoms.  Hypertension She has a list of home BPs, sometimes with values in the 140s Continue amlodipine  olmesartan  5-40 Will be difficult to balance with her vasopressor issues  LBBB Stable We discussed potential symptoms of progression of LBBB, which could easily be confused with her  vagal episodes    Signed, Efraim Grange, MD  08/31/2023 9:30 AM    [facility]

## 2023-08-31 ENCOUNTER — Ambulatory Visit: Attending: Cardiovascular Disease | Admitting: Cardiovascular Disease

## 2023-08-31 ENCOUNTER — Encounter: Payer: Self-pay | Admitting: Cardiovascular Disease

## 2023-08-31 VITALS — BP 144/72 | HR 58 | Ht 62.5 in | Wt 186.0 lb

## 2023-08-31 DIAGNOSIS — R002 Palpitations: Secondary | ICD-10-CM

## 2023-08-31 NOTE — Patient Instructions (Signed)

## 2023-09-08 ENCOUNTER — Ambulatory Visit: Admitting: Orthopedic Surgery

## 2023-09-08 ENCOUNTER — Encounter: Payer: Self-pay | Admitting: Orthopedic Surgery

## 2023-09-08 VITALS — BP 125/60 | HR 42 | Ht 62.5 in | Wt 184.0 lb

## 2023-09-08 DIAGNOSIS — M25551 Pain in right hip: Secondary | ICD-10-CM | POA: Diagnosis not present

## 2023-09-08 NOTE — Progress Notes (Signed)
 New Patient Visit  Assessment: Brooke Castillo is a 78 y.o. female with the following: 1. Pain in right hip  Plan: Brooke Castillo reports that she started to have a lot of pain deep within the right buttock, which then radiated into her groin.  She has started taking a supplement, over the past couple weeks, and feels a lot better.  We reviewed radiographs in clinic today, which demonstrates mild degenerative changes in bilateral hips.  She does have some anterolisthesis of the lumbar spine but if she is asymptomatic, this does not need to be addressed.  This was discussed with the patient.  She states understanding.  She will continue with medications as needed.  Follow-up in clinic as needed.  Follow-up: Return if symptoms worsen or fail to improve.  Subjective:  Chief Complaint  Patient presents with   Hip Pain    R started deep in the buttocks as well as deep in the groin for 2-3 mos but has recently tried some herbal supplements that have helped the pain in the past month.    History of Present Illness: Brooke Castillo is a 78 y.o. female who presents for evaluation of right hip pain.  She states that she started to have pain in the right buttock, greater than 3 months ago.  It then radiated into her right groin.  This continued for approximately 2-3 months.  She has recently started to use a supplement, which has helped with her pain.  She is not having any issues today.  No specific injury.  She has had sciatica in the past.  No issues in her groin previously.   Review of Systems: No fevers or chills No numbness or tingling No chest pain No shortness of breath No bowel or bladder dysfunction No GI distress No headaches   Medical History:  Past Medical History:  Diagnosis Date   Complication of anesthesia    IN 2011 I HAD COLON SURGERY & IT TOOK ME 2 DAYS TO WAKE UP    Diverticulosis    DM2 (diabetes mellitus, type 2) (HCC)    A1c in 12/16 was 6.6   GERD  (gastroesophageal reflux disease)    Hx of colonic polyps    Hymenoptera allergy    Hyperlipidemia    Hypertension    LBBB (left bundle branch block)    transient   Obesity    Rheumatic fever    as a child    Past Surgical History:  Procedure Laterality Date   ABDOMINAL HYSTERECTOMY  1980   complete   APPENDECTOMY  1980   CHOLECYSTECTOMY  1989   COLON SURGERY  2011   removed 11 inches of colon   COLONOSCOPY  08/07/2008   Moderate sigmoid divertiuclosis. Small internal hemorrhoids. Otherwise normal colonoscopy   COLONOSCOPY     around 2017 with Dr Zulema at [facility]   KNEE SURGERY Bilateral 2009   SINOSCOPY     TONSILLECTOMY      Family History  Problem Relation Age of Onset   Heart failure Mother    COPD Mother    Hypertension Mother    Coronary artery disease Father 28       CABGx5; brain stem stroke   Colon polyps Father    Hypertension Sister    Diabetes Sister    Breast cancer Sister    CAD Brother 37       stent   Bone cancer Brother    Prostate cancer Brother  Aneurysm Paternal Grandmother    Heart disease Paternal Grandfather    Colon cancer Neg Hx    Esophageal cancer Neg Hx    Stomach cancer Neg Hx    Rectal cancer Neg Hx    Social History   Tobacco Use   Smoking status: Never    Passive exposure: Past   Smokeless tobacco: Never  Vaping Use   Vaping status: Never Used  Substance Use Topics   Alcohol use: No   Drug use: No    Allergies  Allergen Reactions   Bee Venom Anaphylaxis and Shortness Of Breath    Within minutes pt feels swelling and SOB, has been to ER for reaction   Celecoxib Other (See Comments)    SOB, weight gain      Ciprofloxacin Hives    Denies airway involvement   Codeine Other (See Comments)    knocks me out, CNS change    Diflucan [Fluconazole] Hives    Denies airway involvement   Ezetimibe Other (See Comments)    Caused leg swelling and flushing     Flagyl [Metronidazole] Palpitations    Niaspan [Niacin Er (Antihyperlipidemic)] Other (See Comments)    Feels flush badly   Statins Other (See Comments)    Achey, muscle pain    Current Meds  Medication Sig   albuterol  (PROVENTIL  HFA;VENTOLIN  HFA) 108 (90 BASE) MCG/ACT inhaler Inhale 1-2 puffs into the lungs every 4 (four) hours as needed for wheezing or shortness of breath.   amLODipine -olmesartan  (AZOR ) 5-40 MG tablet Take 1 tablet by mouth daily.   amoxicillin  (AMOXIL ) 500 MG capsule Take 4 capsules (2,000 mg total) by mouth 1 hour before dental appointment.   cetirizine (ZYRTEC) 10 MG tablet Take 1 tablet by mouth daily.   cholecalciferol (VITAMIN D ) 1000 units tablet Take 1,000 Units by mouth daily.   CONTOUR NEXT TEST test strip USE TO CHECK BLOOD SUGAR LEVELS UP TO THREE TIMES A DAY   diclofenac Sodium (VOLTAREN) 1 % GEL Apply 2 g topically 4 (four) times daily as needed.   EPINEPHrine  0.3 mg/0.3 mL IJ SOAJ injection Use as directed for life-threatening allergic reaction. (Patient taking differently: Inject 0.3 mg into the muscle once as needed (for life-threatening allergic reaction).)   estradiol  (ESTRACE ) 0.5 MG tablet Take 1 tablet (0.5 mg total) by mouth every other day.   fenofibrate  160 MG tablet Take 1 tablet (160 mg total) by mouth every evening.   fluticasone  (FLONASE) 50 MCG/ACT nasal spray Place 1-2 sprays into the nose daily.   furosemide  (LASIX ) 40 MG tablet TAKE 1 TABLET(40 MG) BY MOUTH DAILY as needed for swelling (Patient taking differently: Take 40 mg by mouth daily as needed for fluid.)   pravastatin  (PRAVACHOL ) 20 MG tablet Take 1 tablet (20 mg total) by mouth at bedtime.   Semaglutide ,0.25 or 0.5MG /DOS, (OZEMPIC , 0.25 OR 0.5 MG/DOSE,) 2 MG/3ML SOPN Inject 0.5 mg into the skin once a week.    Objective: BP 125/60   Pulse (!) 42   Ht 5' 2.5 (1.588 m)   Wt 184 lb (83.5 kg)   BMI 33.12 kg/m   Physical Exam:  General: Alert and oriented. and No acute distress. Gait: Normal gait.  No point  tenderness.  Excellent lower body strength.  Negative straight leg raise bilaterally.  She tolerates internal and external rotation of bilateral hips, without discomfort.  Toes warm and well-perfused.  IMAGING: I personally reviewed images previously obtained in clinic  X-rays lumbar spine are without acute injury.  There is anterolisthesis in the lower lumbar spine.  Grade 1 at most.  X-rays of the right hip were obtained.  Mild loss of joint space.  Small osteophytes.  No acute injuries.   New Medications:  No orders of the defined types were placed in this encounter.     Oneil DELENA Horde, MD  09/08/2023 11:23 AM

## 2023-09-15 ENCOUNTER — Other Ambulatory Visit: Payer: Self-pay | Admitting: Student

## 2023-09-15 ENCOUNTER — Other Ambulatory Visit (HOSPITAL_COMMUNITY): Payer: Self-pay

## 2023-09-15 ENCOUNTER — Other Ambulatory Visit: Payer: Self-pay | Admitting: Family Medicine

## 2023-09-17 NOTE — Telephone Encounter (Signed)
 Call not able to be completed as dialed. Called son Augustin who is her emergency contact. He stated he would have pt. Call us  back for more information on  Is she still taking semaglutide  0.25?  Is she still on the 0.25 dose? If not what dose is she on?  When was the last dose?

## 2023-09-22 ENCOUNTER — Other Ambulatory Visit (HOSPITAL_COMMUNITY): Payer: Self-pay

## 2023-09-23 ENCOUNTER — Other Ambulatory Visit (HOSPITAL_COMMUNITY): Payer: Self-pay

## 2023-09-23 ENCOUNTER — Other Ambulatory Visit: Payer: Self-pay | Admitting: Family Medicine

## 2023-09-23 MED ORDER — PANTOPRAZOLE SODIUM 40 MG PO TBEC: 40.0000 mg | DELAYED_RELEASE_TABLET | Freq: Every day | ORAL | 3 refills | Status: DC

## 2023-09-23 MED ORDER — OZEMPIC (0.25 OR 0.5 MG/DOSE) 2 MG/3ML ~~LOC~~ SOPN: 0.5000 mg | PEN_INJECTOR | SUBCUTANEOUS | 2 refills | Status: DC

## 2023-09-24 ENCOUNTER — Encounter (HOSPITAL_COMMUNITY): Payer: Self-pay

## 2023-09-24 ENCOUNTER — Other Ambulatory Visit (HOSPITAL_COMMUNITY): Payer: Self-pay

## 2023-09-24 ENCOUNTER — Other Ambulatory Visit: Payer: Self-pay | Admitting: Family Medicine

## 2023-09-24 MED ORDER — ESTRADIOL 0.5 MG PO TABS
0.5000 mg | ORAL_TABLET | ORAL | 0 refills | Status: DC
  Filled 2023-09-24: qty 45, 90d supply, fill #0

## 2023-09-24 MED ORDER — OZEMPIC (0.25 OR 0.5 MG/DOSE) 2 MG/3ML ~~LOC~~ SOPN
0.5000 mg | PEN_INJECTOR | SUBCUTANEOUS | 2 refills | Status: DC
  Filled 2023-09-24: qty 3, 28d supply, fill #0
  Filled 2023-10-23: qty 3, 28d supply, fill #1
  Filled 2023-11-11 – 2023-11-13 (×2): qty 3, 28d supply, fill #2

## 2023-09-24 MED ORDER — PANTOPRAZOLE SODIUM 40 MG PO TBEC
40.0000 mg | DELAYED_RELEASE_TABLET | Freq: Every day | ORAL | 3 refills | Status: DC
  Filled 2023-09-24 – 2023-10-23 (×2): qty 30, 30d supply, fill #0
  Filled 2023-11-17: qty 30, 30d supply, fill #1
  Filled 2023-12-17: qty 30, 30d supply, fill #2
  Filled 2023-12-17: qty 30, 30d supply, fill #0
  Filled 2024-01-18: qty 30, 30d supply, fill #1

## 2023-09-24 NOTE — Telephone Encounter (Signed)
 Patient came to the office to follow up on requested med refills.   Script for Estradiol  originally sent to [facility] for 90 day supply. Pharmacy told her the insurance company would only pay for 45 days at a time and gave her that quantity. When patient went back to get the remaining 45 day supply yesterday, [facility] told her the script was transferred to [facility]  [facility] won't honor the original script and needs a new script for Estradiol  (pharmacy stated it was discontinued).  Second issue: Ozempic  usually administered on Wednesdays. Patient went to [facility] pharmacy last week Wednesday, 09/15/23 (1 week prior to when it was due). Pharmacy told her it was denied.  Patient received call to increase dose to 1.0 for Ozempic , but [facility] pharmacy still doesn't have a script. [facility] refill request. Patient missed yesterday's dose of Ozempic  since the refill wasn't sent to the correct pharmacy.  Patient currently out of Estradiol , Protonics, and Ozempic . Requesting for scripts for all three to be sent to [facility] to get back on schedule. Hereafter, patient is requesting for all of her refills to be sent to [facility] except the Contour Next Test Strips. She'd like those sent to [facility].   Please call patient at [phone] with any questions.

## 2023-10-01 ENCOUNTER — Other Ambulatory Visit (HOSPITAL_COMMUNITY): Payer: Self-pay

## 2023-10-01 DIAGNOSIS — K080 Exfoliation of teeth due to systemic causes: Secondary | ICD-10-CM | POA: Diagnosis not present

## 2023-10-08 DIAGNOSIS — K080 Exfoliation of teeth due to systemic causes: Secondary | ICD-10-CM | POA: Diagnosis not present

## 2023-10-23 ENCOUNTER — Other Ambulatory Visit (HOSPITAL_COMMUNITY): Payer: Self-pay

## 2023-10-26 ENCOUNTER — Ambulatory Visit: Admitting: Family Medicine

## 2023-10-26 ENCOUNTER — Other Ambulatory Visit (HOSPITAL_COMMUNITY): Payer: Self-pay

## 2023-10-26 ENCOUNTER — Encounter: Payer: Self-pay | Admitting: Family Medicine

## 2023-10-26 VITALS — BP 118/78 | HR 68 | Temp 98.1°F | Ht 62.5 in | Wt 184.0 lb

## 2023-10-26 DIAGNOSIS — J40 Bronchitis, not specified as acute or chronic: Secondary | ICD-10-CM | POA: Diagnosis not present

## 2023-10-26 DIAGNOSIS — Z91030 Bee allergy status: Secondary | ICD-10-CM

## 2023-10-26 DIAGNOSIS — J029 Acute pharyngitis, unspecified: Secondary | ICD-10-CM | POA: Diagnosis not present

## 2023-10-26 DIAGNOSIS — I10 Essential (primary) hypertension: Secondary | ICD-10-CM | POA: Diagnosis not present

## 2023-10-26 MED ORDER — AMOXICILLIN 875 MG PO TABS
875.0000 mg | ORAL_TABLET | Freq: Two times a day (BID) | ORAL | 0 refills | Status: AC
  Filled 2023-10-26: qty 14, 7d supply, fill #0

## 2023-10-26 MED ORDER — EPINEPHRINE 0.3 MG/0.3ML IJ SOAJ
0.3000 mg | Freq: Once | INTRAMUSCULAR | 2 refills | Status: AC | PRN
  Filled 2023-10-26: qty 2, 15d supply, fill #0

## 2023-10-26 MED ORDER — BENZONATATE 100 MG PO CAPS
100.0000 mg | ORAL_CAPSULE | Freq: Three times a day (TID) | ORAL | 0 refills | Status: DC | PRN
  Filled 2023-10-26: qty 30, 10d supply, fill #0

## 2023-10-26 NOTE — Progress Notes (Signed)
 Patient Office Visit  Assessment & Plan:  Bronchitis -     Benzonatate ; Take 1 capsule (100 mg total) by mouth 3 (three) times daily as needed.  Dispense: 30 capsule; Refill: 0 -     Amoxicillin ; Take 1 tablet (875 mg total) by mouth 2 (two) times daily for 7 days.  Dispense: 14 tablet; Refill: 0  Sore throat -     Benzonatate ; Take 1 capsule (100 mg total) by mouth 3 (three) times daily as needed.  Dispense: 30 capsule; Refill: 0 -     Amoxicillin ; Take 1 tablet (875 mg total) by mouth 2 (two) times daily for 7 days.  Dispense: 14 tablet; Refill: 0  Allergic to bees -     EPINEPHrine ; Inject 0.3 mg into the muscle once as needed (for life-threatening allergic reaction).  Dispense: 2 each; Refill: 2  Primary hypertension   Assessment and Plan    Acute pharyngitis with cough and laryngitis Symptoms suggest viral or bacterial infection. Negative COVID-19 test for family members. Persistent sore throat and cough. - Prescribed amoxicillin  for possible bacterial infection. - Prescribed Tessalon  Perles for cough. - Advised over-the-counter Delsym for additional cough management.  Neurocardiogenic syncope with bradycardia/LBBB Bradycardia with heart rate dropping to 37 bpm. Cardiologist informed. No beta blockers due to bradycardia.   prediabetes, well controlled A1c 5.8 as of May.  Hypertension Blood pressure well controlled with current medication.  Allergy to bee venom, requires epinephrine  autoinjector Current EpiPen  expired in 2018. - Prescribed new EpiPen .          Return if symptoms worsen or fail to improve.   Subjective:    Patient ID: Brooke Castillo, female    DOB: 1945/10/16  Age: 78 y.o. MRN: [id number]  Chief Complaint  Patient presents with   Cough    Pt states she has had a cough, sore throat and mucous in her throat x 4 days.     Cough   Discussed the use of AI scribe software for clinical note transcription with the patient, who gave verbal  consent to proceed.  History of Present Illness        Brooke Castillo is a 78 year old female who presents with a sore throat and cough since last Thursday  Her symptoms began last Thursday with a sensation of a dry throat and feeling as if something was wrapped around her vocal cords. She experiences difficulty swallowing and a sensation of choking, which has progressively worsened. By yesterday, she developed laryngitis and a persistent cough that kept her awake, coughing every hour throughout the night.  She has no fever but feels cold and was unsure if it was chills. She has not measured her temperature. There is no sinus pressure, and she is not producing any phlegm. Her family members were diagnosed with bacterial infections and were treated with antibiotics, but she has not been on any antibiotics recently. Family members were tested for COVID and were negative.   Her past medical history includes well-controlled prediabetes with an A1c of 5.8 as of May, irregular heartbeat, and neurocardiogenic syncope. She is currently taking amlodipine  with olmesartan  combo for blood pressure management. She denies any current smoking or history of smoking.  She has a history of bronchitis but notes that she does not experience it as frequently now. She has tried over-the-counter medications for her current symptoms without significant relief, Mucinex D. She reports soreness from coughing but denies wheezing. She has an inhaler from 2015 that  she has not used recently. patient feels like she is getting worse. patient cannot take codeine products.   She mentions needing a new EpiPen  due to an allergy to bee stings, as her current one expired in 2018. Physical Exam HEENT: Ears not infected, minimal fluid, no cerumen, clean. Throat with drainage, no strep, no redness, no white spots. CHEST: No wheezing, lungs clear to auscultation,  Results A1c: 5.8 (07/2021) Assessment & Plan Acute pharyngitis with  cough and laryngitis Symptoms suggest viral or bacterial infection. Negative COVID-19 test for family members. Persistent sore throat and cough. - Prescribed amoxicillin  for possible bacterial infection. - Prescribed Tessalon  Perles for cough. - Advised over-the-counter Delsym for additional cough management.  Neurocardiogenic syncope with bradycardia/LBBB Bradycardia with heart rate dropping to 37 bpm. Cardiologist informed. No beta blockers due to bradycardia.  Well controlled type 2 DM A1c 5.8 as of May.  Hypertension Blood pressure well controlled with current medication.  Allergy to bee venom, requires epinephrine  autoinjector Current EpiPen  expired in 2018. - Prescribed new EpiPen .    The 10-year ASCVD risk score (Arnett DK, et al., 2019) is: 31.5%  Past Medical History:  Diagnosis Date   Allergy 2002   Arthritis    Asthma 03/2000   Cataract 09/2016   Complication of anesthesia    IN 2011 I HAD COLON SURGERY & IT TOOK ME 2 DAYS TO WAKE UP    Diverticulosis    DM2 (diabetes mellitus, type 2) (HCC)    A1c in 12/16 was 6.6   GERD (gastroesophageal reflux disease)    Hx of colonic polyps    Hymenoptera allergy    Hyperlipidemia    Hypertension    LBBB (left bundle branch block)    transient   Obesity    Rheumatic fever    as a child   Past Surgical History:  Procedure Laterality Date   ABDOMINAL HYSTERECTOMY  1980   complete   APPENDECTOMY  1980   CHOLECYSTECTOMY  1989   COLON SURGERY  2011   removed 11 inches of colon   COLONOSCOPY  08/07/2008   Moderate sigmoid divertiuclosis. Small internal hemorrhoids. Otherwise normal colonoscopy   COLONOSCOPY     around 2017 with Dr [facility]ulema at [facility]   JOINT REPLACEMENT  12/24/2007   Bilateral knees   KNEE SURGERY Bilateral 2009   SINOSCOPY     TONSILLECTOMY     Social History   Tobacco Use   Smoking status: Never    Passive exposure: Past   Smokeless tobacco: Never  Vaping Use   Vaping  status: Never Used  Substance Use Topics   Alcohol use: No   Drug use: No   Family History  Problem Relation Age of Onset   Heart failure Mother    COPD Mother    Hypertension Mother    Arthritis Mother    Diabetes Mother    Heart disease Mother    Coronary artery disease Father 10       CABGx5; brain stem stroke   Colon polyps Father    Heart disease Father    Stroke Father    Hypertension Sister    Diabetes Sister    Breast cancer Sister    CAD Brother 53       stent   Bone cancer Brother    Prostate cancer Brother    Aneurysm Paternal Grandmother    Heart disease Paternal Grandfather    Colon cancer Neg Hx    Esophageal cancer Neg  Hx    Stomach cancer Neg Hx    Rectal cancer Neg Hx    Allergies  Allergen Reactions   Bee Venom Anaphylaxis and Shortness Of Breath    Within minutes pt feels swelling and SOB, has been to ER for reaction   Celecoxib Other (See Comments)    SOB, weight gain      Ciprofloxacin Hives    Denies airway involvement   Codeine Other (See Comments)    knocks me out, CNS change    Diflucan [Fluconazole] Hives    Denies airway involvement   Ezetimibe Other (See Comments)    Caused leg swelling and flushing     Flagyl [Metronidazole] Palpitations   Niaspan [Niacin Er (Antihyperlipidemic)] Other (See Comments)    Feels flush badly   Statins Other (See Comments)    Achey, muscle pain    Review of Systems  Respiratory:  Positive for cough.       Objective:    BP 118/78   Pulse 68   Temp 98.1 F (36.7 C)   Ht 5' 2.5 (1.588 m)   Wt 184 lb (83.5 kg)   SpO2 99%   BMI 33.12 kg/m  BP Readings from Last 3 Encounters:  10/26/23 118/78  09/08/23 125/60  08/31/23 (!) 144/72   Wt Readings from Last 3 Encounters:  10/26/23 184 lb (83.5 kg)  09/08/23 184 lb (83.5 kg)  08/31/23 186 lb (84.4 kg)    Physical Exam Vitals and nursing note reviewed.  Constitutional:      Appearance: Normal appearance.  HENT:     Head:  Normocephalic.     Right Ear: Tympanic membrane, ear canal and external ear normal.     Left Ear: Tympanic membrane, ear canal and external ear normal.  Eyes:     Extraocular Movements: Extraocular movements intact.     Conjunctiva/sclera: Conjunctivae normal.     Pupils: Pupils are equal, round, and reactive to light.  Cardiovascular:     Rate and Rhythm: Normal rate and regular rhythm.     Heart sounds: Normal heart sounds.  Pulmonary:     Effort: Pulmonary effort is normal.     Breath sounds: Normal breath sounds.  Musculoskeletal:     Right lower leg: No edema.     Left lower leg: No edema.  Neurological:     General: No focal deficit present.     Mental Status: She is alert and oriented to person, place, and time.  Psychiatric:        Mood and Affect: Mood normal.        Behavior: Behavior normal.      No results found for any visits on 10/26/23.

## 2023-10-27 ENCOUNTER — Other Ambulatory Visit (HOSPITAL_COMMUNITY): Payer: Self-pay

## 2023-10-30 ENCOUNTER — Telehealth: Payer: Self-pay

## 2023-10-30 NOTE — Telephone Encounter (Signed)
 Copied from CRM #[id number]. Topic: Clinical - Medical Advice >> Oct 30, 2023  1:39 PM DeAngela L wrote: Reason for CRM: patient calling to after she was prescribed amoxicillin  (AMOXIL ) 875 MG tablet and she states she doesn't believe this prescription is working for her and would like to ask if her provider could prescribe her a Zpac  Patient would also like to ask if she should keep taking the mucus DM She states she was also still taking this medication Pt num [phone] BENNIE)  [facility] [address], [address], [place] [address] Phone: [phone]  Fax: [phone]

## 2023-11-04 ENCOUNTER — Other Ambulatory Visit: Payer: Self-pay

## 2023-11-04 ENCOUNTER — Other Ambulatory Visit: Payer: Self-pay | Admitting: Family Medicine

## 2023-11-04 ENCOUNTER — Other Ambulatory Visit (HOSPITAL_COMMUNITY): Payer: Self-pay

## 2023-11-04 ENCOUNTER — Other Ambulatory Visit: Payer: Self-pay | Admitting: Student

## 2023-11-04 MED ORDER — ESTRADIOL 0.5 MG PO TABS
0.5000 mg | ORAL_TABLET | ORAL | 0 refills | Status: AC
  Filled 2023-11-04 – 2023-12-17 (×3): qty 45, 90d supply, fill #0

## 2023-11-04 MED ORDER — AMLODIPINE-OLMESARTAN 5-40 MG PO TABS
1.0000 | ORAL_TABLET | Freq: Every day | ORAL | 3 refills | Status: AC
  Filled 2023-11-04: qty 90, 90d supply, fill #0
  Filled 2024-02-19: qty 90, 90d supply, fill #1
  Filled 2024-02-22: qty 90, 90d supply, fill #0

## 2023-11-04 MED FILL — Fenofibrate Tab 160 MG: ORAL | 90 days supply | Qty: 90 | Fill #0 | Status: CN

## 2023-11-05 ENCOUNTER — Other Ambulatory Visit: Payer: Self-pay

## 2023-11-05 MED ORDER — AZITHROMYCIN 250 MG PO TABS
ORAL_TABLET | ORAL | 0 refills | Status: DC
Start: 2023-11-05 — End: 2023-11-06

## 2023-11-05 NOTE — Telephone Encounter (Signed)
 Order has been chaged and awaiting cosign

## 2023-11-06 ENCOUNTER — Other Ambulatory Visit: Payer: Self-pay

## 2023-11-06 DIAGNOSIS — J40 Bronchitis, not specified as acute or chronic: Secondary | ICD-10-CM

## 2023-11-06 DIAGNOSIS — J029 Acute pharyngitis, unspecified: Secondary | ICD-10-CM

## 2023-11-06 MED ORDER — AZITHROMYCIN 250 MG PO TABS: ORAL_TABLET | ORAL | 0 refills | Status: DC

## 2023-11-11 ENCOUNTER — Encounter: Payer: Self-pay | Admitting: Family Medicine

## 2023-11-11 ENCOUNTER — Other Ambulatory Visit (HOSPITAL_COMMUNITY): Payer: Self-pay

## 2023-11-11 ENCOUNTER — Ambulatory Visit (INDEPENDENT_AMBULATORY_CARE_PROVIDER_SITE_OTHER): Admitting: Family Medicine

## 2023-11-11 VITALS — BP 117/60 | HR 64 | Temp 97.7°F | Ht 62.5 in | Wt 182.4 lb

## 2023-11-11 DIAGNOSIS — R001 Bradycardia, unspecified: Secondary | ICD-10-CM

## 2023-11-11 LAB — CBC WITH DIFFERENTIAL/PLATELET
Absolute Lymphocytes: 1753 {cells}/uL (ref 850–3900)
Absolute Monocytes: 449 {cells}/uL (ref 200–950)
Basophils Absolute: 41 {cells}/uL (ref 0–200)
Basophils Relative: 0.6 %
Eosinophils Absolute: 186 {cells}/uL (ref 15–500)
Eosinophils Relative: 2.7 %
HCT: 45.4 % — ABNORMAL HIGH (ref 35.0–45.0)
Hemoglobin: 15.3 g/dL (ref 11.7–15.5)
MCH: 30.5 pg (ref 27.0–33.0)
MCHC: 33.7 g/dL (ref 32.0–36.0)
MCV: 90.4 fL (ref 80.0–100.0)
MPV: 11.9 fL (ref 7.5–12.5)
Monocytes Relative: 6.5 %
Neutro Abs: 4471 {cells}/uL (ref 1500–7800)
Neutrophils Relative %: 64.8 %
Platelets: 251 Thousand/uL (ref 140–400)
RBC: 5.02 Million/uL (ref 3.80–5.10)
RDW: 11.9 % (ref 11.0–15.0)
Total Lymphocyte: 25.4 %
WBC: 6.9 Thousand/uL (ref 3.8–10.8)

## 2023-11-11 LAB — COMPREHENSIVE METABOLIC PANEL WITH GFR
AG Ratio: 1.6 (calc) (ref 1.0–2.5)
ALT: 14 U/L (ref 6–29)
AST: 22 U/L (ref 10–35)
Albumin: 4.4 g/dL (ref 3.6–5.1)
Alkaline phosphatase (APISO): 47 U/L (ref 37–153)
BUN: 18 mg/dL (ref 7–25)
CO2: 28 mmol/L (ref 20–32)
Calcium: 9.6 mg/dL (ref 8.6–10.4)
Chloride: 102 mmol/L (ref 98–110)
Creat: 0.81 mg/dL (ref 0.60–1.00)
Globulin: 2.7 g/dL (ref 1.9–3.7)
Glucose, Bld: 158 mg/dL — ABNORMAL HIGH (ref 65–99)
Potassium: 4.1 mmol/L (ref 3.5–5.3)
Sodium: 139 mmol/L (ref 135–146)
Total Bilirubin: 0.4 mg/dL (ref 0.2–1.2)
Total Protein: 7.1 g/dL (ref 6.1–8.1)
eGFR: 76 mL/min/1.73m2 (ref >=60)

## 2023-11-11 LAB — TSH: TSH: 2.18 m[IU]/L (ref 0.40–4.50)

## 2023-11-11 NOTE — Progress Notes (Signed)
 Subjective:  HPI: Brooke Castillo is a 78 y.o. female presenting on 11/11/2023 for Acute Visit (Heart rate dropping causing dizziness, tunnel vision, with random hot flashes. On and off since [facility] [place] like a referral to a new cardiologist )   HPI Patient is in today for ongoing presumed vasovagal episodes. She has been experiencing these episodes since February. Symptoms include dizziness, tunnel vision, and clamminess. No syncope, chest pain, SOB, palpitations, nausea, vomiting. HR at these times is as low as 33-60s. She has had episodes 1-5 times per day for last 8 days. She does go weeks without episodes. Symptoms usually occur when she is sitting. The resolve after she takes deep breaths and relaxes Has been drinking liquid IV as advised when symptomatic She does wear compression stocking occasionally. She did have a head cold 2 weeks ago and was treated with Amoxil  and Z-Pack.  Cardiology HPI and A&P 08/31/2023 for reference: She has a history of left bundle branch block.  She was admitted in February 2025 with paroxysms of lightheadedness.  She had worn a monitor that showed bradycardia with brief pauses, longest about 3 seconds.   When I met her in the hospital, she experienced an episode of dizziness and presyncope while sitting in the bed.  Her telemetry showed normal sinus rhythm with a rate of about 60 bpm.  We deferred pacemaker at that time considering that she had neurocardiogenic cause for her lightheadedness with a strong vasodilatory component.  She was symptomatic with normal heart rate indicated that a pacemaker would not be of significant benefit.   She was discharged with a monitor that showed multiple episodes of dizziness and lightheadedness correlating with normal sinus rhythm.   She was admitted again in June 2025 with recurrent symptoms of dizziness and lightheadedness.  Again she occasionally had episodes of bradycardia.    Today, she presents for follow-up.   She is accompanied by her son and daughter-in-law, Luke, who worked in our [employer] for some time.  Echocardiogram:   TTE February 2025 50 to 55%.     Monitors:   14 day monitor 04/2023  -- my interpretation Sinus rhythm, heart rate 27 to 110 bpm, average 62 bpm Multiple symptom episodes reported.  Multiple episodes of dizziness and lightheaded correlated with normal sinus rhythm.  Episodes correlated with supraventricular ectopy.  Some episodes of dizziness and lightheadedness correlated with sinus bradycardia.  1 event included a pause of 3.9 seconds.     EKG:   EKG Interpretation Date/Time:                  Monday August 31 2023 09:12:25 EDT Ventricular Rate:         58 PR Interval:                 240 QRS Duration:             142 QT Interval:                 452 QTC Calculation:443 R Axis:                         -50   Text Interpretation:Sinus bradycardia with 1st degree A-V block Left axis deviation Left bundle branch block When compared with ECG of 24-Aug-2023 17:03, No significant change was found Confirmed by Nancey Scotts [phone]) on 08/31/2023 9:30:18 AM   Dizziness, lightheadedness --neurocardiogenic syncope She has not had frank syncope Multiple episodes of  dizziness and lightheadedness that have occurred during sinus rhythm with normal rates She has also had episodes of bradycardia, sometimes with rates as low as 30 bpm. I do not think a pacemaker will significantly improve her symptoms We discussed options moving forward I advised her to remain active and exercise regularly, to push fluids, to consume a reasonable amount of salt, to wear compressions stockings, and to lie down supine at the earliest onset of symptoms.   Hypertension She has a list of home BPs, sometimes with values in the 140s Continue amlodipine  olmesartan  5-40 Will be difficult to balance with her vasopressor issues   LBBB Stable We discussed potential symptoms of progression of LBBB, which  could easily be confused with her vagal episodes  Review of Systems  All other systems reviewed and are negative.   Relevant past medical history reviewed and updated as indicated.   Past Medical History:  Diagnosis Date   Allergy 2002   Arthritis    Asthma 03/2000   Cataract 09/2016   Complication of anesthesia    IN 2011 I HAD COLON SURGERY & IT TOOK ME 2 DAYS TO WAKE UP    Diverticulosis    DM2 (diabetes mellitus, type 2) (HCC)    A1c in 12/16 was 6.6   GERD (gastroesophageal reflux disease)    Hx of colonic polyps    Hymenoptera allergy    Hyperlipidemia    Hypertension    LBBB (left bundle branch block)    transient   Obesity    Rheumatic fever    as a child     Past Surgical History:  Procedure Laterality Date   ABDOMINAL HYSTERECTOMY  1980   complete   APPENDECTOMY  1980   CHOLECYSTECTOMY  1989   COLON SURGERY  2011   removed 11 inches of colon   COLONOSCOPY  08/07/2008   Moderate sigmoid divertiuclosis. Small internal hemorrhoids. Otherwise normal colonoscopy   COLONOSCOPY     around 2017 with Dr Zulema at [facility]   JOINT REPLACEMENT  12/24/2007   Bilateral knees   KNEE SURGERY Bilateral 2009   SINOSCOPY     TONSILLECTOMY      Allergies and medications reviewed and updated.   Current Outpatient Medications:    albuterol  (PROVENTIL  HFA;VENTOLIN  HFA) 108 (90 BASE) MCG/ACT inhaler, Inhale 1-2 puffs into the lungs every 4 (four) hours as needed for wheezing or shortness of breath., Disp: , Rfl:    amLODipine -olmesartan  (AZOR ) 5-40 MG tablet, Take 1 tablet by mouth daily., Disp: 90 tablet, Rfl: 3   cetirizine (ZYRTEC) 10 MG tablet, Take 1 tablet by mouth daily., Disp: , Rfl:    cholecalciferol (VITAMIN D ) 1000 units tablet, Take 1,000 Units by mouth daily., Disp: , Rfl:    CONTOUR NEXT TEST test strip, USE TO CHECK BLOOD SUGAR LEVELS UP TO THREE TIMES A DAY, Disp: 250 strip, Rfl: 3   diclofenac Sodium (VOLTAREN) 1 % GEL, Apply 2 g topically  4 (four) times daily as needed., Disp: , Rfl:    EPINEPHrine  0.3 mg/0.3 mL IJ SOAJ injection, Inject 0.3 mg into the muscle once as needed (for life-threatening allergic reaction)., Disp: 2 each, Rfl: 2   estradiol  (ESTRACE ) 0.5 MG tablet, Take 1 tablet (0.5 mg total) by mouth every other day., Disp: 45 tablet, Rfl: 0   fenofibrate  160 MG tablet, Take 1 tablet (160 mg total) by mouth every evening., Disp: 90 tablet, Rfl: 1   fluticasone  (FLONASE) 50 MCG/ACT nasal spray, Place  1-2 sprays into the nose daily., Disp: , Rfl:    furosemide  (LASIX ) 40 MG tablet, TAKE 1 TABLET(40 MG) BY MOUTH DAILY as needed for swelling (Patient taking differently: Take 40 mg by mouth daily as needed for edema. TAKE 1 TABLET(40 MG) BY MOUTH DAILY as needed for swelling), Disp: 90 tablet, Rfl: 0   pantoprazole  (PROTONIX ) 40 MG tablet, Take 1 tablet (40 mg total) by mouth daily., Disp: 30 tablet, Rfl: 3   pravastatin  (PRAVACHOL ) 20 MG tablet, Take 1 tablet (20 mg total) by mouth at bedtime., Disp: 90 tablet, Rfl: 2   Semaglutide ,0.25 or 0.5MG /DOS, (OZEMPIC , 0.25 OR 0.5 MG/DOSE,) 2 MG/3ML SOPN, Inject 0.5 mg into the skin once a week., Disp: 3 mL, Rfl: 2  Allergies  Allergen Reactions   Bee Venom Anaphylaxis and Shortness Of Breath    Within minutes pt feels swelling and SOB, has been to ER for reaction   Celecoxib Other (See Comments)    SOB, weight gain      Ciprofloxacin Hives    Denies airway involvement   Codeine Other (See Comments)    knocks me out, CNS change    Diflucan [Fluconazole] Hives    Denies airway involvement   Ezetimibe Other (See Comments)    Caused leg swelling and flushing     Flagyl [Metronidazole] Palpitations   Niaspan [Niacin Er (Antihyperlipidemic)] Other (See Comments)    Feels flush badly   Statins Other (See Comments)    Achey, muscle pain    Objective:   BP 117/60   Pulse 64   Temp 97.7 F (36.5 C)   Ht 5' 2.5 (1.588 m)   Wt 182 lb 6.4 oz (82.7 kg)   SpO2 97%    BMI 32.83 kg/m      11/11/2023   10:32 AM 10/26/2023    2:32 PM 09/08/2023   10:49 AM  Vitals with BMI  Height 5' 2.5 5' 2.5 5' 2.5  Weight 182 lbs 6 oz 184 lbs 184 lbs  BMI 32.81 33.1 33.1  Systolic 117 118 874  Diastolic 60 78 60  Pulse 64 68 42     Physical Exam Vitals and nursing note reviewed.  Constitutional:      Appearance: Normal appearance. She is normal weight.  HENT:     Head: Normocephalic and atraumatic.  Cardiovascular:     Rate and Rhythm: Regular rhythm. Bradycardia present.     Pulses: Normal pulses.     Heart sounds: Normal heart sounds.  Pulmonary:     Effort: Pulmonary effort is normal.     Breath sounds: Normal breath sounds.  Skin:    General: Skin is warm and dry.  Neurological:     General: No focal deficit present.     Mental Status: She is alert and oriented to person, place, and time. Mental status is at baseline.  Psychiatric:        Mood and Affect: Mood normal.        Behavior: Behavior normal.        Thought Content: Thought content normal.        Judgment: Judgment normal.     Assessment & Plan:  Symptomatic bradycardia Assessment & Plan: EKG unchanged today sinus brady with first degree block and LBBB. CMP, CBC, TSH drawn. Ms Sweeny is keeping a records of these episodes with associated HR. She has also been closely monitoring her BP and BG. Would like referral to another cardiologist for second opinion. Aware of when to  seek emergent medical care.   Orders: -     Comprehensive metabolic panel with GFR -     CBC with Differential/Platelet -     EKG 12-Lead -     Ambulatory referral to Cardiology -     TSH     Follow up plan: Return if symptoms worsen or fail to improve, for as scheduled.  Jeoffrey GORMAN Barrio, FNP

## 2023-11-11 NOTE — Assessment & Plan Note (Signed)
 EKG unchanged today sinus brady with first degree block and LBBB. CMP, CBC, TSH drawn. Ms Brooke Castillo is keeping a records of these episodes with associated HR. She has also been closely monitoring her BP and BG. Would like referral to another cardiologist for second opinion. Aware of when to seek emergent medical care.

## 2023-11-17 ENCOUNTER — Ambulatory Visit: Payer: Self-pay

## 2023-11-17 ENCOUNTER — Other Ambulatory Visit (HOSPITAL_COMMUNITY): Payer: Self-pay

## 2023-11-17 ENCOUNTER — Ambulatory Visit: Payer: Self-pay | Admitting: Family Medicine

## 2023-11-17 NOTE — Telephone Encounter (Signed)
 FYI Only or Action Required?: Action required by provider: referral request.  Patient was last seen in primary care on 11/11/2023 by Kayla Jeoffrey RAMAN, FNP.  Called Nurse Triage reporting Heart Problem.  Symptoms began several months ago.  Interventions attempted: Nothing.  Symptoms are: unchanged.  Triage Disposition: Home Care  Patient/caregiver understands and will follow disposition?: Yes     Copied from CRM #[id number]. Topic: Clinical - Red Word Triage >> Nov 17, 2023 10:30 AM Charlet HERO wrote: Red Word that prompted transfer to Nurse Triage: Patient is calling  to check on being referred to another cardiologist she is stating that she has been having her heart rate drop into the 30's. She is stating that it happens out of nowhere Reason for Disposition  [1] Skipped or extra beat(s) AND [2] occurs < 4 times / minute    HR in the 30's, seen 8/27. Looking for cardiologist refferal  Answer Assessment - Initial Assessment Questions Had appointment 8/27 for symptoms, is calling back to get referred to a different cardiologist. One on record is booked out 6 months so she would like to be referred to someone different.  1. DESCRIPTION: Please describe your heart rate or heartbeat that you are having (e.g., fast/slow, regular/irregular, skipped or extra beats, palpitations)     HR going down into the 30's   2. ONSET: When did it start? (e.g., minutes, hours, days)      Since February  3. DURATION: How long does it last (e.g., seconds, minutes, hours)     Happening every single day, where as when it first started it was only 2-3 days.  4. PATTERN Does it come and go, or has it been constant since it started?  Does it get worse with exertion?   Are you feeling it now?     Happening multiple times a day  5. RECURRENT SYMPTOM: Have you ever had this before? If Yes, ask: When was the last time? and What happened that time?      Happening since February  8. OTHER  SYMPTOMS: Do you have any other symptoms? (e.g., dizziness, chest pain, sweating, difficulty breathing)       No  Protocols used: Heart Rate and Heartbeat Questions-A-AH

## 2023-11-23 ENCOUNTER — Encounter: Payer: Self-pay | Admitting: Internal Medicine

## 2023-11-23 ENCOUNTER — Ambulatory Visit: Attending: Internal Medicine | Admitting: Internal Medicine

## 2023-11-23 VITALS — BP 147/85 | HR 48 | Ht 62.5 in | Wt 184.0 lb

## 2023-11-23 DIAGNOSIS — I447 Left bundle-branch block, unspecified: Secondary | ICD-10-CM | POA: Diagnosis not present

## 2023-11-23 DIAGNOSIS — I5032 Chronic diastolic (congestive) heart failure: Secondary | ICD-10-CM | POA: Diagnosis not present

## 2023-11-23 DIAGNOSIS — R001 Bradycardia, unspecified: Secondary | ICD-10-CM

## 2023-11-23 DIAGNOSIS — R42 Dizziness and giddiness: Secondary | ICD-10-CM

## 2023-11-23 DIAGNOSIS — I440 Atrioventricular block, first degree: Secondary | ICD-10-CM

## 2023-11-23 DIAGNOSIS — R002 Palpitations: Secondary | ICD-10-CM | POA: Diagnosis not present

## 2023-11-23 DIAGNOSIS — E782 Mixed hyperlipidemia: Secondary | ICD-10-CM | POA: Diagnosis not present

## 2023-11-23 NOTE — Progress Notes (Addendum)
 Cardiology Office Note   Date:  11/23/2023  ID:  Brooke, Castillo [birth year], MRN [id number] PCP: Kayla Jeoffrey RAMAN, FNP  [facility] Providers Cardiologist:  None Electrophysiologist:  Eulas FORBES Furbish, MD     History of Present Illness Brooke Castillo is a 78 y.o. female with a past medical history of sinus bradycardia, sinus arrhythmia, left bundle branch block, hypertriglyceridemia, recurrent dizziness/lightheadedness who presents today for second opinion.  She was initially seen in the ED in February 2025 with concern for symptomatic bradycardia and it was decided not to pursue pacemaker placement at that time and instead place a long-term event monitor with outpatient follow-up.  Previously she had noted to have symptoms of dizziness and lightheadedness during episodes of sinus rhythm with both bradycardic and normal rates and therefore she was advised to exercise regularly, push fluids and consume salt with compression stockings and lie down at the earliest sign symptoms but did not have a pacemaker placed.  Today, she states that she has been having increasing frequency symptoms of lightheadedness and has started keeping a journal of her symptoms and heart rates.  She is now having much more frequent episodes of bradycardia with heart rates in the 30s several times per week and is symptomatic during each of those episodes.  She has never passed out but does feel lightheaded and occasionally with a headache.  She does not get any chest pain or shortness of breath.  Upon presentation to the office today the patient experienced 1 of these episodes and an EKG was obtained which showed sinus bradycardia with sinus arrhythmia and first-degree AV block as below.  Her heart rate then normalized.  Again during my exam, she had a recurrent episode and heart rate dipped into the 50s as noted by her wrist watch.   ROS:  Review of Systems  All other systems reviewed and are  negative.   Physical Exam  Physical Exam Vitals and nursing note reviewed.  Constitutional:      Appearance: Normal appearance.  HENT:     Head: Normocephalic and atraumatic.  Eyes:     Conjunctiva/sclera: Conjunctivae normal.  Cardiovascular:     Rate and Rhythm: Regular rhythm. Bradycardia present.  Pulmonary:     Effort: Pulmonary effort is normal.     Breath sounds: Normal breath sounds.  Abdominal:     General: Abdomen is flat.     Palpations: Abdomen is soft.  Musculoskeletal:        General: No swelling or tenderness.  Skin:    Coloration: Skin is not jaundiced or pale.  Neurological:     Mental Status: She is alert. Mental status is at baseline.  Psychiatric:        Mood and Affect: Mood normal.        Behavior: Behavior normal.     VS:  BP (!) 147/85   Pulse (!) 48   Ht 5' 2.5 (1.588 m)   Wt 184 lb (83.5 kg)   SpO2 97%   BMI 33.12 kg/m         Wt Readings from Last 3 Encounters:  11/23/23 184 lb (83.5 kg)  11/11/23 182 lb 6.4 oz (82.7 kg)  10/26/23 184 lb (83.5 kg)     EKG Interpretation Date/Time:  Monday November 23 2023 10:28:25 EDT Ventricular Rate:  48 PR Interval:  228 QRS Duration:  140 QT Interval:  470 QTC Calculation: 419 R Axis:   -53  Text Interpretation: Sinus bradycardia with  marked sinus arrhythmia with 1st degree A-V block Left axis deviation Non-specific intra-ventricular conduction block Minimal voltage criteria for LVH, may be normal variant ( Cornell product ) Cannot rule out Anteroseptal infarct , age undetermined When compared with ECG of 31-Aug-2023 09:12, No significant change was found Confirmed by Kriste Hicks [phone]) on 11/23/2023 10:31:02 AM    Studies Reviewed  14-day event monitor 05/27/2023: Sinus rhythm HR 27-110 bpm, avg 62 <1% ectopy burden though sometimes occurring in brief runs There were multiple patient-triggered episodes. Most were associated with sinus rhythm. Occasionally, there were isolated  PACs. Some episodes were associated with bradycardia with both sinus slowing and AV block. The longest pause was 4.8 seconds. Symptoms of lightheadedness and fainting were also associated with normal sinus rhythm.  Echocardiogram 04/29/2023: EF 50 to 55% with global hypokinesis Grade 1 diastolic dysfunction GLS -16.5% Low normal RV systolic function  Pharmacologic SPECT 05/10/2015  EKG Interpretation Date/Time:  Monday November 23 2023 10:28:25 EDT Ventricular Rate:  48 PR Interval:  228 QRS Duration:  140 QT Interval:  470 QTC Calculation: 419 R Axis:   -53  Text Interpretation: Sinus bradycardia with marked sinus arrhythmia with 1st degree A-V block Left axis deviation Non-specific intra-ventricular conduction block Minimal voltage criteria for LVH, may be normal variant ( Cornell product ) Cannot rule out Anteroseptal infarct , age undetermined When compared with ECG of 31-Aug-2023 09:12, No significant change was found Confirmed by Kriste Hicks [phone]) on 11/23/2023 10:31:02 AM    Risk Assessment/Calculations    ASSESSMENT  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  EKG today during an episode showed a heart rate of 48 which resolved spontaneously.  She is not on any AV nodal blocking agents. Chronic heart failure with preserved ejection fraction, not in acute exacerbation grade 1 diastolic dysfunction. on Lasix  40 mg daily as needed Mixed hyperlipidemia on pravastatin  20 mg and fenofibrate  Hypertension on amlodipine -olmesartan  5-40 mg.  BP today elevated however per recent home recordings her blood pressure is well-controlled Diabetes HbA1c 5.8 on Ozempic   Plan  Given the increasing frequency of symptoms that seem to be associated with bradycardia, will ask the patient to follow back up with  electrophysiology.  She states that if needed, she is open to having a pacemaker placed.  Follow up general cardiology: 6 months        Signed, Hicks FORBES Kriste, MD

## 2023-11-23 NOTE — Patient Instructions (Addendum)
 Medication Instructions:  Your physician recommends that you continue on your current medications as directed. Please refer to the Current Medication list given to you today.  *If you need a refill on your cardiac medications before your next appointment, please call your pharmacy*  Lab Work: None ordered.  You may go to any [facility] Location for your lab work:  [facility] - [id number] ([facility]) - [address] [phone] [address]  [place] - [address]   [place]  - [address]   [place] - [address] - [address] [employer]  - [address] ([facility]   If you have labs (blood work) drawn today and your tests are completely normal, you will receive your results only by: [employer] (if you have MyChart)  If you have any lab test that is abnormal or we need to change your treatment, we will call you or send a MyChart message to review the results.  Testing/Procedures: None ordered.  Follow-Up: At [facility], you and your health needs are our priority.  As part of our continuing mission to provide you with exceptional heart care, we have created designated Provider Care Teams.  These Care Teams include your primary Cardiologist (physician) and Advanced Practice Providers (APPs -  Physician Assistants and Nurse Practitioners) who all work together to provide you with the care you need, when you need it.   Your next appointment:   Follow up with Dr Nancey in 6 months

## 2023-12-09 ENCOUNTER — Other Ambulatory Visit (HOSPITAL_COMMUNITY): Payer: Self-pay

## 2023-12-09 ENCOUNTER — Other Ambulatory Visit: Payer: Self-pay

## 2023-12-09 MED FILL — Fenofibrate Tab 160 MG: ORAL | 90 days supply | Qty: 90 | Fill #0 | Status: AC

## 2023-12-11 ENCOUNTER — Other Ambulatory Visit (HOSPITAL_COMMUNITY): Payer: Self-pay

## 2023-12-11 NOTE — Progress Notes (Signed)
 Brooke Castillo                                          MRN: [id number]   12/11/2023   The [facility] Quality Team Specialist reviewed this patient medical record for the purposes of chart review for care gap closure. The following were reviewed: chart review for care gap closure-kidney health evaluation for diabetes:eGFR  and uACR.    [facility] Quality Team

## 2023-12-17 ENCOUNTER — Other Ambulatory Visit: Payer: Self-pay | Admitting: Family Medicine

## 2023-12-17 ENCOUNTER — Other Ambulatory Visit (HOSPITAL_COMMUNITY): Payer: Self-pay

## 2023-12-17 MED ORDER — OZEMPIC (0.25 OR 0.5 MG/DOSE) 2 MG/3ML ~~LOC~~ SOPN
0.5000 mg | PEN_INJECTOR | SUBCUTANEOUS | 2 refills | Status: DC
  Filled 2023-12-17: qty 3, 28d supply, fill #0
  Filled 2024-01-14: qty 3, 28d supply, fill #1

## 2024-01-14 ENCOUNTER — Other Ambulatory Visit (HOSPITAL_COMMUNITY): Payer: Self-pay

## 2024-01-18 ENCOUNTER — Other Ambulatory Visit (HOSPITAL_COMMUNITY): Payer: Self-pay

## 2024-01-28 ENCOUNTER — Ambulatory Visit
Admission: RE | Admit: 2024-01-28 | Discharge: 2024-01-28 | Disposition: A | Discharge status: Home / Self Care | Source: Ambulatory Visit | Attending: Family Medicine | Admitting: Family Medicine

## 2024-01-28 DIAGNOSIS — Z1231 Encounter for screening mammogram for malignant neoplasm of breast: Secondary | ICD-10-CM | POA: Diagnosis not present

## 2024-02-09 ENCOUNTER — Other Ambulatory Visit

## 2024-02-09 DIAGNOSIS — E559 Vitamin D deficiency, unspecified: Secondary | ICD-10-CM | POA: Diagnosis not present

## 2024-02-09 DIAGNOSIS — I10 Essential (primary) hypertension: Secondary | ICD-10-CM

## 2024-02-09 DIAGNOSIS — E1169 Type 2 diabetes mellitus with other specified complication: Secondary | ICD-10-CM

## 2024-02-10 ENCOUNTER — Ambulatory Visit: Payer: Self-pay | Admitting: Family Medicine

## 2024-02-16 ENCOUNTER — Other Ambulatory Visit (HOSPITAL_COMMUNITY): Payer: Self-pay

## 2024-02-16 ENCOUNTER — Encounter: Payer: Self-pay | Admitting: Family Medicine

## 2024-02-16 ENCOUNTER — Ambulatory Visit: Admitting: Family Medicine

## 2024-02-16 VITALS — BP 130/75 | HR 52 | Temp 98.0°F | Ht 62.5 in | Wt 185.6 lb

## 2024-02-16 DIAGNOSIS — E119 Type 2 diabetes mellitus without complications: Secondary | ICD-10-CM | POA: Diagnosis not present

## 2024-02-16 DIAGNOSIS — Z79818 Long term (current) use of other agents affecting estrogen receptors and estrogen levels: Secondary | ICD-10-CM | POA: Diagnosis not present

## 2024-02-16 DIAGNOSIS — E559 Vitamin D deficiency, unspecified: Secondary | ICD-10-CM | POA: Insufficient documentation

## 2024-02-16 DIAGNOSIS — Z23 Encounter for immunization: Secondary | ICD-10-CM

## 2024-02-16 DIAGNOSIS — M25552 Pain in left hip: Secondary | ICD-10-CM | POA: Diagnosis not present

## 2024-02-16 DIAGNOSIS — I5030 Unspecified diastolic (congestive) heart failure: Secondary | ICD-10-CM

## 2024-02-16 DIAGNOSIS — R001 Bradycardia, unspecified: Secondary | ICD-10-CM

## 2024-02-16 DIAGNOSIS — I10 Essential (primary) hypertension: Secondary | ICD-10-CM

## 2024-02-16 DIAGNOSIS — K219 Gastro-esophageal reflux disease without esophagitis: Secondary | ICD-10-CM | POA: Insufficient documentation

## 2024-02-16 DIAGNOSIS — G8929 Other chronic pain: Secondary | ICD-10-CM

## 2024-02-16 DIAGNOSIS — E782 Mixed hyperlipidemia: Secondary | ICD-10-CM

## 2024-02-16 MED ORDER — OZEMPIC (0.25 OR 0.5 MG/DOSE) 2 MG/3ML ~~LOC~~ SOPN
0.5000 mg | PEN_INJECTOR | SUBCUTANEOUS | 2 refills | Status: AC
  Filled 2024-02-16: qty 3, 28d supply, fill #0
  Filled 2024-03-08: qty 3, 28d supply, fill #1
  Filled 2024-04-07 – 2024-04-08 (×2): qty 3, 28d supply, fill #2

## 2024-02-16 NOTE — Progress Notes (Signed)
 Acute Office Visit  Patient ID: Brooke Castillo, female    DOB: [birth year], 78 y.o.   MRN: [id number]  PCP: Kayla Jeoffrey RAMAN, FNP  Chief Complaint  Patient presents with   Follow-up    6 month f/u wants to discuss increasing ozempic   Referral for lft hip pain  Flu shot today     Subjective:     HPI  [name] is here today for management of her chronic conditions.  PMH includes chronic hip pain, HLD, HTN, DM2, symptomatic bradycardia, HFpEF  HTN: followed by cardiology, on amlodipine -olmesartan  5-40mg  daily  HFpEF: followed by Cardiology, grade 1 diastolic dysfunction, on Lasix  40mg  daily PRN  GERD: on Protonix  40mg  daily Denies hematemesis, melena, hematochezia, occult blood in stool, iron deficiency anemia, anorexia, unexplained weight loss, dysphagia, odynophagia, persistent vomiting   DM: FBG <120, carb conscious diet, is losing weight, walking daily, on Ozempic  0.5mg  weekly Lab Results  Component Value Date   HGBA1C 5.8 (H) 02/09/2024   HGBA1C 5.8 (H) 08/12/2023   HGBA1C 5.8 04/28/2023    HLD: pravastatin  20mg  daily and fenofibrate , heart healthy diet, exercise limited by hip pain Lipid Panel     Component Value Date/Time   CHOL 168 02/09/2024 0800   CHOL 222 (H) 10/16/2022 1030   CHOL 205 (H) 03/23/2014 1216   TRIG 208 (H) 02/09/2024 0800   TRIG 276 (H) 03/23/2014 1216   HDL 52 02/09/2024 0800   HDL 39 (L) 10/16/2022 1030   HDL 52 03/23/2014 1216   CHOLHDL 3.2 02/09/2024 0800   VLDL 75 (H) 08/18/2013 0906   LDLCALC 86 02/09/2024 0800   LDLCALC 98 03/23/2014 1216   LDLDIRECT 97 08/18/2013 0906   LABVLDL 92 (H) 10/16/2022 1030   Symptomatic Bradycardia: followed by Cardiology, improved with liquid IV and walking daily   Hip pain: right sided originating mid buttock and radiating to groin, similar to prior left sided hip pain for which she was seeing Ortho for in 2023, XR at that time showed X-rays plain radiographs show no fracture dislocation or other acute  bony abnormality throughout the pelvis hips or lower back. No notable bone-on-bone changes of the hip joints themselves. There is signs of degenerative changes of the lumbosacral spine. No recent trauma or injury. Denies saddle numbness, incontinence of urine or stool. No numbness, tingling, or weakness. Pain is worse with hip flexion and rotation.   Discussed the use of AI scribe software for clinical note transcription with the patient, who gave verbal consent to proceed.  History of Present Illness Brooke Castillo is a 78 year old female who presents for follow-up on her cardiovascular health and medication management.  She has a history of symptomatic bradycardia. She has been taking a liquid IV with electrolytes every morning and has increased her physical activity. She has not experienced any episodes for the past six weeks.  She engages in regular physical activity, walking between 6,000 to 8,000 steps daily, sometimes achieving up to three miles a day, depending on the weather. She plans to join a gym in January to maintain her activity level during the winter months.  She has a history of low vitamin D  levels and takes 2,000 units of vitamin D  daily. She believes her vitamin D  levels should be adequate due to her outdoor activities.  She has struggled with cholesterol management her whole life and is currently taking pravastatin  20 mg. She also takes fenofibrate , amlodipine , and olmesartan  for blood pressure management, which is currently  well-controlled.  She is on Ozempic  0.5 mg for blood sugar control, with recent blood sugars in the 90s to low 100s. She has hit a weight plateau at 185 lbs and is concerned about muscle loss despite maintaining physical activity. She ensures to eat three protein-rich meals a day.  She experiences worsening left hip pain, described as located under the left buttock and worsening in the afternoon, relieved by a heating pad. The pain does not radiate down the  leg. She has tried exercises for sciatica and has had an x-ray showing no degenerative changes.  She takes Protonix  for acid reflux and estrogen every other day for hip pain and night sweats.  No chest pain, palpitations, or reflux symptoms. No blood in stool, nausea, vomiting, or difficulty swallowing. No numbness or weakness in the legs.   Review of Systems  All other systems reviewed and are negative.      Objective:    BP 130/75   Pulse (!) 52   Temp 98 F (36.7 C)   Ht 5' 2.5 (1.588 m)   Wt 185 lb 9.6 oz (84.2 kg)   SpO2 97%   BMI 33.41 kg/m  BP Readings from Last 3 Encounters:  02/16/24 130/75  11/23/23 (!) 147/85  11/11/23 117/60   Wt Readings from Last 3 Encounters:  02/16/24 185 lb 9.6 oz (84.2 kg)  11/23/23 184 lb (83.5 kg)  11/11/23 182 lb 6.4 oz (82.7 kg)      Physical Exam Vitals and nursing note reviewed.  Constitutional:      Appearance: Normal appearance. She is normal weight.  HENT:     Head: Normocephalic and atraumatic.  Cardiovascular:     Rate and Rhythm: Regular rhythm. Bradycardia present.     Pulses: Normal pulses.     Heart sounds: Normal heart sounds.  Pulmonary:     Effort: Pulmonary effort is normal.     Breath sounds: Normal breath sounds.  Musculoskeletal:     Lumbar back: Normal. Negative right straight leg raise test and negative left straight leg raise test.     Right hip: Normal.     Left hip: Tenderness present.       Legs:  Skin:    General: Skin is warm and dry.  Neurological:     General: No focal deficit present.     Mental Status: She is alert and oriented to person, place, and time. Mental status is at baseline.  Psychiatric:        Mood and Affect: Mood normal.        Behavior: Behavior normal.        Thought Content: Thought content normal.        Judgment: Judgment normal.       No results found for any visits on 02/16/24.     Assessment & Plan:   Problem List Items Addressed This Visit     HTN  (hypertension)   Current use of estrogen therapy   Symptomatic bradycardia   (HFpEF) heart failure with preserved ejection fraction (HCC)   Hyperlipidemia   Chronic left hip pain   Diabetes mellitus treated with injections of non-insulin medication (HCC) - Primary   Relevant Medications   Semaglutide ,0.25 or 0.5MG /DOS, (OZEMPIC , 0.25 OR 0.5 MG/DOSE,) 2 MG/3ML SOPN   Gastroesophageal reflux disease without esophagitis   Vitamin D  deficiency   Other Visit Diagnoses       Need for vaccination       Relevant Orders   Flu vaccine  HIGH DOSE PF(Fluzone Trivalent) (Completed)       Assessment and Plan Assessment & Plan Chronic left hip pain Pain localized under left buttock, relieved by heat, not sciatica. Previous x-ray negative for degenerative changes. - Provided orthopedics referral information.  Type 2 diabetes mellitus Well-controlled with A1c at goal. Blood sugars stable. Concerns about weight plateau and muscle wasting with increased semaglutide  dosage. Decision to maintain current dosage. - Continue semaglutide  (Ozempic ) 0.5 mg weekly. - Monitor blood sugars closely. - Encouraged high protein intake.  Hypertension Continue current antihypertensive regimen.  Hyperlipidemia Cholesterol levels not at goal. Decision to increase pravastatin  for better management. - Increased pravastatin  to 40 mg daily.  Symptomatic bradycardia No acute issues. Cardiologist recommended electrolyte supplementation and increased physical activity. - Continue electrolyte supplementation and physical activity.  Gastroesophageal reflux disease Well-controlled with pantoprazole . - Continue pantoprazole  (Protonix ) as prescribed.  Vitamin D  deficiency - Continue vitamin D  supplementation at 2000 units daily.  General health maintenance Discussed flu and COVID-19 vaccinations. She opted for flu vaccine, declined COVID-19 vaccine.    Meds ordered this encounter  Medications    Semaglutide ,0.25 or 0.5MG /DOS, (OZEMPIC , 0.25 OR 0.5 MG/DOSE,) 2 MG/3ML SOPN    Sig: Inject 0.5 mg into the skin once a week.    Dispense:  3 mL    Refill:  2    Supervising Provider:   DUANNE LOWERS T [[id number]]    Return in about 6 months (around 08/16/2024) for annual physical with labs 1 week prior.  Jeoffrey GORMAN Barrio, FNP  [facility]

## 2024-02-17 ENCOUNTER — Other Ambulatory Visit (HOSPITAL_COMMUNITY): Payer: Self-pay

## 2024-02-17 LAB — LIPID PANEL
Cholesterol: 168 mg/dL (ref <200)
HDL: 52 mg/dL (ref >=50)
LDL Cholesterol (Calc): 86 mg/dL
Non-HDL Cholesterol (Calc): 116 mg/dL (ref <130)
Total CHOL/HDL Ratio: 3.2 (calc) (ref <5.0)
Triglycerides: 208 mg/dL — ABNORMAL HIGH (ref <150)

## 2024-02-17 LAB — COMPREHENSIVE METABOLIC PANEL WITH GFR
AG Ratio: 1.5 (calc) (ref 1.0–2.5)
ALT: 15 U/L (ref 6–29)
AST: 24 U/L (ref 10–35)
Albumin: 4.1 g/dL (ref 3.6–5.1)
Alkaline phosphatase (APISO): 39 U/L (ref 37–153)
BUN: 19 mg/dL (ref 7–25)
CO2: 30 mmol/L (ref 20–32)
Calcium: 9.8 mg/dL (ref 8.6–10.4)
Chloride: 102 mmol/L (ref 98–110)
Creat: 0.84 mg/dL (ref 0.60–1.00)
Globulin: 2.7 g/dL (ref 1.9–3.7)
Glucose, Bld: 94 mg/dL (ref 65–99)
Potassium: 4.6 mmol/L (ref 3.5–5.3)
Sodium: 139 mmol/L (ref 135–146)
Total Bilirubin: 0.5 mg/dL (ref 0.2–1.2)
Total Protein: 6.8 g/dL (ref 6.1–8.1)
eGFR: 72 mL/min/1.73m2 (ref >=60)

## 2024-02-17 LAB — CBC WITH DIFFERENTIAL/PLATELET
Absolute Lymphocytes: 2412 {cells}/uL (ref 850–3900)
Absolute Monocytes: 529 {cells}/uL (ref 200–950)
Basophils Absolute: 27 {cells}/uL (ref 0–200)
Basophils Relative: 0.4 %
Eosinophils Absolute: 141 {cells}/uL (ref 15–500)
Eosinophils Relative: 2.1 %
HCT: 43.8 % (ref 35.9–46.0)
Hemoglobin: 14.4 g/dL (ref 11.7–15.5)
MCH: 29.7 pg (ref 27.0–33.0)
MCHC: 32.9 g/dL (ref 31.6–35.4)
MCV: 90.3 fL (ref 81.4–101.7)
MPV: 11.4 fL (ref 7.5–12.5)
Monocytes Relative: 7.9 %
Neutro Abs: 3591 {cells}/uL (ref 1500–7800)
Neutrophils Relative %: 53.6 %
Platelets: 245 Thousand/uL (ref 140–400)
RBC: 4.85 Million/uL (ref 3.80–5.10)
RDW: 12.1 % (ref 11.0–15.0)
Total Lymphocyte: 36 %
WBC: 6.7 Thousand/uL (ref 3.8–10.8)

## 2024-02-17 LAB — TEST AUTHORIZATION

## 2024-02-17 LAB — HEMOGLOBIN A1C
Hgb A1c MFr Bld: 5.8 % — ABNORMAL HIGH (ref <5.7)
Mean Plasma Glucose: 120 mg/dL
eAG (mmol/L): 6.6 mmol/L

## 2024-02-17 LAB — MICROALBUMIN / CREATININE URINE RATIO
Creatinine, Urine: 47 mg/dL (ref 20–275)
Microalb, Ur: <0.2 mg/dL

## 2024-02-17 LAB — MAGNESIUM: Magnesium: 2.1 mg/dL (ref 1.5–2.5)

## 2024-02-17 LAB — VITAMIN D 25 HYDROXY (VIT D DEFICIENCY, FRACTURES): Vit D, 25-Hydroxy: 46 ng/mL (ref 30–100)

## 2024-02-19 ENCOUNTER — Other Ambulatory Visit (HOSPITAL_COMMUNITY): Payer: Self-pay

## 2024-02-19 ENCOUNTER — Other Ambulatory Visit: Payer: Self-pay

## 2024-02-22 ENCOUNTER — Other Ambulatory Visit (HOSPITAL_COMMUNITY): Payer: Self-pay

## 2024-02-23 ENCOUNTER — Other Ambulatory Visit (HOSPITAL_COMMUNITY): Payer: Self-pay

## 2024-02-24 ENCOUNTER — Telehealth: Payer: Self-pay

## 2024-02-24 ENCOUNTER — Other Ambulatory Visit: Payer: Self-pay

## 2024-02-24 ENCOUNTER — Other Ambulatory Visit (HOSPITAL_COMMUNITY): Payer: Self-pay

## 2024-02-24 ENCOUNTER — Other Ambulatory Visit: Payer: Self-pay | Admitting: Family Medicine

## 2024-02-24 MED ORDER — PANTOPRAZOLE SODIUM 40 MG PO TBEC
40.0000 mg | DELAYED_RELEASE_TABLET | Freq: Every day | ORAL | 3 refills | Status: DC
  Filled 2024-02-24 – 2024-02-25 (×2): qty 30, 30d supply, fill #0

## 2024-02-24 NOTE — Telephone Encounter (Signed)
 Copied from CRM #[id number]. Topic: Clinical - Medication Question >> Feb 24, 2024  9:02 AM Harlene ORN wrote: Reason for CRM: Patient wants all three refills at once instead of waiting for one refill at a time. Please call back the patient to confirm if this can be changed to a 90 day.

## 2024-02-25 ENCOUNTER — Other Ambulatory Visit: Payer: Self-pay

## 2024-02-25 ENCOUNTER — Other Ambulatory Visit (HOSPITAL_COMMUNITY): Payer: Self-pay

## 2024-02-25 DIAGNOSIS — K219 Gastro-esophageal reflux disease without esophagitis: Secondary | ICD-10-CM

## 2024-02-25 MED ORDER — PANTOPRAZOLE SODIUM 40 MG PO TBEC: 40.0000 mg | DELAYED_RELEASE_TABLET | Freq: Every day | ORAL | 1 refills | Status: AC

## 2024-02-26 ENCOUNTER — Other Ambulatory Visit: Payer: Self-pay

## 2024-02-29 ENCOUNTER — Other Ambulatory Visit: Payer: Self-pay

## 2024-03-08 ENCOUNTER — Other Ambulatory Visit (HOSPITAL_COMMUNITY): Payer: Self-pay

## 2024-03-08 MED FILL — Fenofibrate Tab 160 MG: ORAL | 90 days supply | Qty: 90 | Fill #1 | Status: AC

## 2024-03-28 ENCOUNTER — Other Ambulatory Visit (HOSPITAL_COMMUNITY): Payer: Self-pay

## 2024-04-07 ENCOUNTER — Telehealth (HOSPITAL_COMMUNITY): Payer: Self-pay

## 2024-04-07 ENCOUNTER — Other Ambulatory Visit (HOSPITAL_COMMUNITY): Payer: Self-pay

## 2024-04-07 NOTE — Telephone Encounter (Signed)
 PA request has been Received. New Encounter has been or will be created for follow up. For additional info see Pharmacy Prior Auth telephone encounter from 04/07/24.

## 2024-04-07 NOTE — Telephone Encounter (Signed)
 Pharmacy Patient Advocate Encounter   Received notification from Pt Calls Messages that prior authorization for Ozempic  (0.25 or 0.5 MG/DOSE) 2MG /3ML pen-injectors  is required/requested.   Insurance verification completed.   The patient is insured through [employer].   Per test claim: PA required; PA submitted to above mentioned insurance via Latent Key/confirmation #/EOC [facility] Status is pending

## 2024-04-08 ENCOUNTER — Other Ambulatory Visit (HOSPITAL_COMMUNITY): Payer: Self-pay

## 2024-04-08 NOTE — Telephone Encounter (Signed)
 Pharmacy Patient Advocate Encounter  Received notification from [employer] that Prior Authorization for  Ozempic  (0.25 or 0.5 MG/DOSE) 2MG /3ML pen-injectors  has been APPROVED from 03/17/24 to 03/16/25. Ran test claim, Copay is $297. This test claim was processed through [facility]- copay amounts may vary at other pharmacies due to pharmacy/plan contracts, or as the patient moves through the different stages of their insurance plan.   PA #/Case ID/Reference #: [id number]  *patient has high copay due to a $250 deductible, not eligible for a copay card because she is on Medicare

## 2024-04-11 ENCOUNTER — Other Ambulatory Visit (HOSPITAL_COMMUNITY): Payer: Self-pay

## 2024-05-24 ENCOUNTER — Ambulatory Visit: Admitting: Pulmonary Disease

## 2024-08-09 ENCOUNTER — Other Ambulatory Visit

## 2024-08-16 ENCOUNTER — Encounter: Admitting: Family Medicine
